# Patient Record
Sex: Male | Born: 1945
Health system: Southern US, Community
[De-identification: ages and names within clinical notes are randomized; demographics above are authoritative.]

## PROBLEM LIST (undated history)

## (undated) DIAGNOSIS — I341 Nonrheumatic mitral (valve) prolapse: Secondary | ICD-10-CM

## (undated) DIAGNOSIS — I10 Essential (primary) hypertension: Secondary | ICD-10-CM

## (undated) DIAGNOSIS — R911 Solitary pulmonary nodule: Secondary | ICD-10-CM

## (undated) DIAGNOSIS — I77810 Thoracic aortic ectasia: Secondary | ICD-10-CM

## (undated) DIAGNOSIS — R42 Dizziness and giddiness: Secondary | ICD-10-CM

## (undated) DIAGNOSIS — C801 Malignant (primary) neoplasm, unspecified: Secondary | ICD-10-CM

## (undated) DIAGNOSIS — R6889 Other general symptoms and signs: Secondary | ICD-10-CM

## (undated) DIAGNOSIS — M549 Dorsalgia, unspecified: Secondary | ICD-10-CM

## (undated) DIAGNOSIS — R002 Palpitations: Secondary | ICD-10-CM

## (undated) DIAGNOSIS — K219 Gastro-esophageal reflux disease without esophagitis: Secondary | ICD-10-CM

## (undated) DIAGNOSIS — I493 Ventricular premature depolarization: Principal | ICD-10-CM

## (undated) HISTORY — DX: Other general symptoms and signs: R68.89

## (undated) HISTORY — PX: CARDIAC CATHETERIZATION: SHX172

## (undated) HISTORY — DX: Thoracic aortic ectasia: I77.810

## (undated) HISTORY — DX: Palpitations: R00.2

## (undated) HISTORY — DX: Gastro-esophageal reflux disease without esophagitis: K21.9

## (undated) HISTORY — DX: Nonrheumatic mitral (valve) prolapse: I34.1

## (undated) HISTORY — PX: COLONOSCOPY: SHX174

## (undated) HISTORY — DX: Essential (primary) hypertension: I10

## (undated) HISTORY — DX: Ventricular premature depolarization: I49.3

## (undated) HISTORY — PX: COLONOSCOPY W/ POLYPECTOMY: SHX1380

## (undated) HISTORY — DX: Dorsalgia, unspecified: M54.9

---

## 1981-11-04 HISTORY — PX: BACK SURGERY: SHX140

## 1995-11-05 HISTORY — PX: EYE SURGERY: SHX253

## 2005-07-12 ENCOUNTER — Emergency Department (HOSPITAL_COMMUNITY): Admission: EM | Admit: 2005-07-12 | Discharge: 2005-07-13 | Payer: Self-pay | Admitting: Emergency Medicine

## 2008-07-19 ENCOUNTER — Ambulatory Visit: Payer: Self-pay | Admitting: Gastroenterology

## 2008-07-27 ENCOUNTER — Ambulatory Visit: Payer: Self-pay | Admitting: Gastroenterology

## 2008-07-27 ENCOUNTER — Encounter: Payer: Self-pay | Admitting: Gastroenterology

## 2008-07-28 ENCOUNTER — Encounter: Payer: Self-pay | Admitting: Gastroenterology

## 2009-07-03 ENCOUNTER — Encounter: Admission: RE | Admit: 2009-07-03 | Discharge: 2009-07-03 | Payer: Self-pay | Admitting: Gastroenterology

## 2010-02-24 ENCOUNTER — Inpatient Hospital Stay (HOSPITAL_COMMUNITY): Admission: EM | Admit: 2010-02-24 | Discharge: 2010-02-26 | Payer: Self-pay | Admitting: Emergency Medicine

## 2010-03-02 ENCOUNTER — Ambulatory Visit: Payer: Self-pay | Admitting: Vascular Surgery

## 2010-09-03 ENCOUNTER — Ambulatory Visit: Payer: Self-pay | Admitting: Cardiology

## 2010-09-24 ENCOUNTER — Encounter: Admission: RE | Admit: 2010-09-24 | Discharge: 2010-09-24 | Payer: Self-pay | Admitting: Cardiology

## 2011-01-22 LAB — TSH: TSH: 0.857 u[IU]/mL (ref 0.350–4.500)

## 2011-01-22 LAB — POCT I-STAT, CHEM 8
BUN: 12 mg/dL (ref 6–23)
Chloride: 102 mEq/L (ref 96–112)
Creatinine, Ser: 0.8 mg/dL (ref 0.4–1.5)
Glucose, Bld: 87 mg/dL (ref 70–99)
Potassium: 3.9 mEq/L (ref 3.5–5.1)
Sodium: 140 mEq/L (ref 135–145)

## 2011-01-22 LAB — CBC
HCT: 46.3 % (ref 39.0–52.0)
HCT: 46.7 % (ref 39.0–52.0)
Hemoglobin: 15.9 g/dL (ref 13.0–17.0)
Hemoglobin: 16.1 g/dL (ref 13.0–17.0)
MCHC: 34 g/dL (ref 30.0–36.0)
MCV: 84.7 fL (ref 78.0–100.0)
Platelets: 142 10*3/uL — ABNORMAL LOW (ref 150–400)
RBC: 5.47 MIL/uL (ref 4.22–5.81)
WBC: 6.4 10*3/uL (ref 4.0–10.5)

## 2011-01-22 LAB — BASIC METABOLIC PANEL
BUN: 13 mg/dL (ref 6–23)
Creatinine, Ser: 0.92 mg/dL (ref 0.4–1.5)
GFR calc Af Amer: >60 mL/min (ref >60)
GFR calc non Af Amer: >60 mL/min (ref >60)
Potassium: 3.3 mEq/L — ABNORMAL LOW (ref 3.5–5.1)

## 2011-01-22 LAB — LIPID PANEL
LDL Cholesterol: 88 mg/dL (ref 0–99)
Total CHOL/HDL Ratio: 2.9 RATIO
Triglycerides: 46 mg/dL (ref <150)
VLDL: 9 mg/dL (ref 0–40)

## 2011-01-22 LAB — CARDIAC PANEL(CRET KIN+CKTOT+MB+TROPI)
CK, MB: 0.4 ng/mL (ref 0.3–4.0)
Relative Index: INVALID (ref 0.0–2.5)
Total CK: 27 U/L (ref 7–232)
Total CK: 40 U/L (ref 7–232)
Troponin I: 0.01 ng/mL (ref 0.00–0.06)
Troponin I: 0.04 ng/mL (ref 0.00–0.06)

## 2011-01-22 LAB — DIFFERENTIAL
Eosinophils Absolute: 0.1 10*3/uL (ref 0.0–0.7)
Eosinophils Relative: 1 % (ref 0–5)
Lymphocytes Relative: 20 % (ref 12–46)
Lymphs Abs: 1.3 10*3/uL (ref 0.7–4.0)
Monocytes Absolute: 0.4 10*3/uL (ref 0.1–1.0)

## 2011-01-22 LAB — PROTIME-INR: Prothrombin Time: 14.1 seconds (ref 11.6–15.2)

## 2011-01-22 LAB — BRAIN NATRIURETIC PEPTIDE: Pro B Natriuretic peptide (BNP): <30 pg/mL (ref 0.0–100.0)

## 2011-01-22 LAB — POCT CARDIAC MARKERS
CKMB, poc: <1 ng/mL — ABNORMAL LOW (ref 1.0–8.0)
Myoglobin, poc: 49.7 ng/mL (ref 12–200)
Troponin i, poc: <0.05 ng/mL (ref 0.00–0.09)

## 2011-01-22 LAB — MRSA PCR SCREENING: MRSA by PCR: NEGATIVE

## 2011-01-22 LAB — HEMOGLOBIN A1C: Hgb A1c MFr Bld: 5.1 % (ref <5.7)

## 2011-03-19 NOTE — Procedures (Signed)
VASCULAR LAB EXAM   INDICATION:  Rule out pseudoaneurysm/AV fistula.   HISTORY:  Diabetes:  Cardiac:  Hypertension:   EXAM:  Right groin duplex.   IMPRESSION:  There does not appear to be any defect noted in the right  groin.  There is no evidence suggesting a pseudoaneurysm or AV fistula.   ___________________________________________  Larina Earthly, M.D.   CB/MEDQ  D:  03/02/2010  T:  03/02/2010  Job:  [id number]

## 2011-04-23 ENCOUNTER — Other Ambulatory Visit: Payer: Self-pay | Admitting: Cardiology

## 2011-04-23 MED ORDER — LISINOPRIL-HYDROCHLOROTHIAZIDE 20-25 MG PO TABS: 1.0000 | ORAL_TABLET | Freq: Every day | ORAL | Status: DC

## 2011-04-23 NOTE — Telephone Encounter (Signed)
Called needing a refill of his blood pressure medication Lysinopril and HTZ refilled at [facility] [phone]. Please call back. I have pulled the chart.

## 2011-04-23 NOTE — Telephone Encounter (Signed)
RN refilled Lisinopril HCT.  Pt notified.

## 2011-04-26 ENCOUNTER — Other Ambulatory Visit: Payer: Self-pay | Admitting: Nurse Practitioner

## 2011-04-26 DIAGNOSIS — I714 Abdominal aortic aneurysm, without rupture: Secondary | ICD-10-CM

## 2011-05-31 ENCOUNTER — Encounter: Payer: Self-pay | Admitting: Cardiovascular Disease

## 2011-05-31 ENCOUNTER — Encounter: Payer: Self-pay | Admitting: *Deleted

## 2011-06-03 ENCOUNTER — Ambulatory Visit: Payer: Self-pay | Admitting: Cardiovascular Disease

## 2011-06-11 ENCOUNTER — Encounter: Payer: Self-pay | Admitting: Cardiovascular Disease

## 2011-06-14 ENCOUNTER — Telehealth: Payer: Self-pay | Admitting: Nurse Practitioner

## 2011-06-14 ENCOUNTER — Telehealth: Payer: Self-pay | Admitting: Cardiovascular Disease

## 2011-06-14 NOTE — Telephone Encounter (Signed)
Please call pt wife back about pt's aneurysm, wife has questions and needs clarification, pt chart transferred to [facility] and pt was assigned to see Dr. Eden Emms on 07/30 however pt was a No Show for Re Establishment, former pt of Dr. Deborah Chalk and pt's wife is familiar with Norma Fredrickson

## 2011-06-14 NOTE — Telephone Encounter (Signed)
Pt's wife calling wanting to know what is an enlarged aorta and does her husband have an aneurysm--this was pt of Wayne Rodriguez and wife upset that they did not know pt had an aneurysm--wife states they had an appoint to establish with Wayne Rodriguez, but wife's sister died so they had to cancel--advised Wayne Rodriguez had a CT chest at [facility] ordered by Wayne Rodriguez on nov. 21,2011 that showed a 4.1 cm dilitation of ascending thoracic aorta, the reason we were concerned is that we like to have this rechecked each year to be sure it is not growing--wife agrees, and i reassured her it was not emergent and all we want is to have Wayne Rodriguez make and keep an appoint with Wayne nishan--pt's wife agrees and will have pt himself call as he has a varied working sched

## 2011-06-14 NOTE — Telephone Encounter (Signed)
Returning call from Wayne Rodriguez. Spoke w/ son who states his mother is at work and would not be home until 7:30. Advised if she needed to speak w/us to call back Monday. See from epic note that Ms. Seitzinger spoke w/ Nishan's office

## 2011-06-14 NOTE — Telephone Encounter (Signed)
Pt's wife called regarding a call they got in July regarding a test done at the hospital and was told it was an enlarged aorta.  Why did they receive this call?!?  Aware that Stanton Kidney is off.

## 2011-07-15 ENCOUNTER — Encounter: Payer: Self-pay | Admitting: Cardiovascular Disease

## 2011-07-15 ENCOUNTER — Ambulatory Visit (INDEPENDENT_AMBULATORY_CARE_PROVIDER_SITE_OTHER): Payer: Self-pay | Admitting: Cardiovascular Disease

## 2011-07-15 DIAGNOSIS — R079 Chest pain, unspecified: Secondary | ICD-10-CM

## 2011-07-15 DIAGNOSIS — I10 Essential (primary) hypertension: Secondary | ICD-10-CM

## 2011-07-15 DIAGNOSIS — I77819 Aortic ectasia, unspecified site: Secondary | ICD-10-CM

## 2011-07-15 DIAGNOSIS — I77810 Thoracic aortic ectasia: Secondary | ICD-10-CM | POA: Insufficient documentation

## 2011-07-15 MED ORDER — LISINOPRIL-HYDROCHLOROTHIAZIDE 20-25 MG PO TABS: 1.0000 | ORAL_TABLET | Freq: Every day | ORAL | Status: DC

## 2011-07-15 NOTE — Assessment & Plan Note (Signed)
Well controlled.  Continue current medications and low sodium Dash type diet.    

## 2011-07-15 NOTE — Assessment & Plan Note (Signed)
Labs with primary in [facility] 11/12  MRA aorta in November

## 2011-07-15 NOTE — Patient Instructions (Signed)
Your physician recommends that you schedule a follow-up appointment in: 6 MONTHS WITH DR [facility]  Your physician recommends that you continue on your current medications as directed. Please refer to the Current Medication list given to you today. Your physician recommends that you return for lab work in: [place]  BMET LIPID  DX 272.4 V58.69  MRA OF AORTA IN November 2012

## 2011-07-15 NOTE — Progress Notes (Signed)
Addended by: Scherrie Bateman E on: 07/15/2011 10:19 AM   Modules accepted: Orders

## 2011-07-15 NOTE — Progress Notes (Signed)
65 yo patient previously seen by Dr Deborah Chalk. Admitted to hospital 4/11 with SSCP.  Cath did not show any significant disease.  Has mild ascending aortic root dilatation.  CT done 11/11  1. The dilatation of the mid ascending and proximal descending portions of the thoracic aorta. Maximal diameter of the ascending thoracic aorta is 4.1 cm and maximal diameter of the descending thoracic aorta is 3.7 cm. 2. Mild emphysematous disease in the upper lobes bilaterally.  Should have F/U in a year.  Suggest MRI/MRA instead as he is relatively young.  Needs refill on BP med.  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  No SSCP, palpitations or dyspnea Compliant with meds.    ROS: Denies fever, malais, weight loss, blurry vision, decreased visual acuity, cough, sputum, SOB, hemoptysis, pleuritic pain, palpitaitons, heartburn, abdominal pain, melena, lower extremity edema, claudication, or rash.  All other systems reviewed and negative   General: Affect appropriate Healthy:  appears stated age HEENT: normal Neck supple with no adenopathy JVP normal no bruits no thyromegaly Lungs clear with no wheezing and good diaphragmatic motion Heart:  S1/S2 no murmur,rub, gallop or click PMI normal Abdomen: benighn, BS positve, no tenderness, no AAA no bruit.  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 aspirin 81 MG tablet Take 81 mg by mouth daily.        Marland Kitchen lisinopril-hydrochlorothiazide (PRINZIDE,ZESTORETIC) 20-25 MG per tablet Take 1 tablet by mouth daily.  30 tablet  3  . nitroGLYCERIN (NITROSTAT) 0.4 MG SL tablet Place 0.4 mg under the tongue every 5 (five) minutes as needed.          Allergies Codeine  Family History: No family history on file.  Social History: History   Social History  . Marital Status:  Married    Spouse Name: N/A    Number of Children: N/A  . Years of Education: N/A   Occupational History  . Not on file.   Social History Main Topics  . Smoking status: Former Games developer  . Smokeless tobacco: Not on file  . Alcohol Use: Not on file  . Drug Use: Not on file  . Sexually Active: Not on file   Other Topics Concern  . Not on file   Social History Narrative  . No narrative on file    Electrocardiogram:  NSR 57 PR 220  Otherwise normal  Assessment and Plan

## 2011-07-15 NOTE — Assessment & Plan Note (Signed)
Resolved.  Cath 4/11 with no significant disease.  Consider future cath from radial as he had a hematoma after last cath

## 2011-09-30 ENCOUNTER — Other Ambulatory Visit: Payer: Self-pay

## 2012-02-03 ENCOUNTER — Ambulatory Visit: Payer: Self-pay | Admitting: *Deleted

## 2012-02-03 DIAGNOSIS — Z0000 Encounter for general adult medical examination without abnormal findings: Secondary | ICD-10-CM

## 2012-02-17 ENCOUNTER — Encounter: Payer: Self-pay | Admitting: Nurse Practitioner

## 2012-02-17 ENCOUNTER — Telehealth: Payer: Self-pay | Admitting: Cardiovascular Disease

## 2012-02-17 ENCOUNTER — Ambulatory Visit (INDEPENDENT_AMBULATORY_CARE_PROVIDER_SITE_OTHER): Payer: Self-pay | Admitting: Nurse Practitioner

## 2012-02-17 VITALS — BP 152/70 | HR 56 | Ht 70.0 in | Wt 154.4 lb

## 2012-02-17 DIAGNOSIS — R6889 Other general symptoms and signs: Secondary | ICD-10-CM

## 2012-02-17 DIAGNOSIS — I77819 Aortic ectasia, unspecified site: Secondary | ICD-10-CM

## 2012-02-17 DIAGNOSIS — R002 Palpitations: Secondary | ICD-10-CM | POA: Insufficient documentation

## 2012-02-17 LAB — CBC WITH DIFFERENTIAL/PLATELET
Basophils Absolute: 0 10*3/uL (ref 0.0–0.1)
Basophils Relative: 0.5 % (ref 0.0–3.0)
Eosinophils Absolute: 0.1 10*3/uL (ref 0.0–0.7)
HCT: 40.3 % (ref 39.0–52.0)
Hemoglobin: 13.5 g/dL (ref 13.0–17.0)
Lymphocytes Relative: 16.9 % (ref 12.0–46.0)
Lymphs Abs: 1.1 10*3/uL (ref 0.7–4.0)
MCHC: 33.5 g/dL (ref 30.0–36.0)
MCV: 85.8 fl (ref 78.0–100.0)
Monocytes Absolute: 0.6 10*3/uL (ref 0.1–1.0)
Neutro Abs: 4.6 10*3/uL (ref 1.4–7.7)
RBC: 4.7 Mil/uL (ref 4.22–5.81)
RDW: 13.4 % (ref 11.5–14.6)

## 2012-02-17 LAB — BASIC METABOLIC PANEL
CO2: 30 mEq/L (ref 19–32)
Calcium: 8.9 mg/dL (ref 8.4–10.5)
Chloride: 101 mEq/L (ref 96–112)
Glucose, Bld: 93 mg/dL (ref 70–99)
Sodium: 139 mEq/L (ref 135–145)

## 2012-02-17 MED ORDER — LISINOPRIL-HYDROCHLOROTHIAZIDE 20-12.5 MG PO TABS: 2.0000 | ORAL_TABLET | Freq: Every day | ORAL | Status: DC

## 2012-02-17 MED ORDER — NITROGLYCERIN 0.4 MG SL SUBL: 0.4000 mg | SUBLINGUAL_TABLET | SUBLINGUAL | Status: DC | PRN

## 2012-02-17 NOTE — Telephone Encounter (Signed)
SPOKE WITH PT.PT COMPLAINING  OF HEART BEATING HARD AND IRREG ,WEAK ,AND NO ENERGY   NOTICED Saturday  APPT MADE WITH Wayne BERGE  NP   FOR TODAY AT 11:45 AM .Wayne Rodriguez

## 2012-02-17 NOTE — Progress Notes (Signed)
Patient Name: Wayne Rodriguez Date of Encounter: 02/17/2012  Primary Care Provider:  Dr. Guadelupe Sabin Primary Cardiologist:  P. Eden Emms, MD  Patient Profile  66 y/o male w/ h/o chest pain and Ao Root Dilatation who presets with a 1 wk h/o wkns and palpitations.  Problem List   Past Medical History  Diagnosis Date  . Chest pain     a. 02/2010 Cath: normal  . HTN (hypertension)   . GERD (gastroesophageal reflux disease)   . Back pain   . Aortic root dilatation     a. 09/2010 CT: 4.1cm Max Asc Ao diam, 3.7cm Max Desc Ao diam;  . Decreased exercise tolerance     a. 02/2012  . Palpitations     a. h/o palps r/t caffeine - has occurred again 02/2012   Past Surgical History  Procedure Date  . Back surgery     Allergies  Allergies  Allergen Reactions  . Beta Adrenergic Blockers     Bradycardia   . Codeine     REACTION: nausea/vomiting    HPI  66 yr old male with the above problem list.  He was in his usoh until about 1 wk ago when he began to experience generalized wkns and reduced exercise tolerance.  He normally walks about a mile/day but since last week, notes that he feels tired (without chest pain or dyspnea) prior to completing his walk.  As a result, he stopped walking.  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  This would last 4-5 mins and resolve spontaneously.  He denies tachypalps.  He went to a local fire dept to have his BP checked in the midst of these episodes and it was elevated @ 169/80.  He has had no palpitations since Saturday but continues to feel fatigued.  Home Medications  Prior to Admission medications   Medication Sig Start Date End Date Taking? Authorizing Provider  aspirin 81 MG tablet Take 81 mg by mouth daily.     Yes Historical Provider, MD  Cyanocobalamin (VITAMIN B-12 PO) Take by  mouth.   Yes Historical Provider, MD  fish oil-omega-3 fatty acids 1000 MG capsule Take 2 g by mouth daily.   Yes Historical Provider, MD  nitroGLYCERIN (NITROSTAT) 0.4 MG SL tablet Place 1 tablet (0.4 mg total) under the tongue every 5 (five) minutes as needed. 02/17/12  Yes Ok Anis, NP  lisinopril-hydrochlorothiazide (PRINZIDE,ZESTORETIC) 20-12.5 MG per tablet Take 2 tablets by mouth daily. 02/17/12 02/16/13  Ok Anis, NP   Review of Systems Fatigue and palpitations as outlined in the HPI.  He's noted a small growth on his left ear pinna which he plans to see dermatology for.  No chest pain, sob, n, v, dizziness, syncope, edema, early satiety, dysuria, dark stools, blood in stools, diarrhea, rash/skin changes, fevers, chills, wt loss/gain.  Otherwise all systems reviewed and negative.  Physical Exam  Blood pressure 152/70, pulse 56, height 5\' 10"  (1.778 m), weight 154 lb 6.4 oz (70.035 kg).  General: Pleasant, NAD Psych: Normal affect. Neuro: Alert and oriented X 3. Moves all extremities spontaneously. HEENT: Normal.  Neck: Supple without bruits or JVD. Lungs:  Resp regular and unlabored, CTA. Heart: RRR no s3, s4, or murmurs. Abdomen: Soft, non-tender, non-distended, BS + x 4.  Extremities: No clubbing, cyanosis or edema.  DP/PT/Radials 2+ and equal bilaterally.  Accessory Clinical Findings  ECG - sinus brady, 1st deg AVB, 56.  No acute st/t changes.  Assessment & Plan  1.  Palpitations:  Pt has prior h/o palpitations prev followed by Dr. Reyes Ivan -> Dr. Deborah Chalk.  Pt says that they were able to isolate caffeine as a causative agent @ that time and he has since avoided.  He's had return of irregular, not necessarily tachy, palpitations.  We'll place a 21 day event monitor to eval.  Check TSH.  2.  Fatigue/Decreased exercise tolerance:  1 wk h/o of wkns/fatigue.  No chest pain or sob.  Will check echo (no evidence of heart failure on exam) and cbc, bmet, tsh.  Will have  him f/u with Dr. Eden Emms after completion of testing.  3.  HTN:  BP has been trending high @ home (140's per pt records) and is high today.  Will increase lisinopril-hctz to 40-25.  4.  Ao Root Dil:  Pt was supposed to have MRA in November but did not 2/2 lack of insurance.  He is now willing to proceed.  Will schedule.   Nicolasa Ducking, NP 02/17/2012, 1:08 PM

## 2012-02-17 NOTE — Patient Instructions (Addendum)
Your physician recommends that you return for lab work in: today (cbc, bmet, tsh)   Your physician recommends that you schedule a follow-up appointment in: 2 weeks with Dr Eden Emms  Your physician has requested that you have an echocardiogram. Echocardiography is a painless test that uses sound waves to create images of your heart. It provides your doctor with information about the size and shape of your heart and how well your heart's chambers and valves are working. This procedure takes approximately one hour. There are no restrictions for this procedure.  Your physician has recommended that you wear an event monitor. Event monitors are medical devices that record the heart's electrical activity. Doctors most often [place] these monitors to diagnose arrhythmias. Arrhythmias are problems with the speed or rhythm of the heartbeat. The monitor is a small, portable device. You can wear one while you do your normal daily activities. This is usually used to diagnose what is causing palpitations/syncope (passing out).  Your physician has requested that you have an MRA Angio of the chest and abdomen for aortic root dilitation.  Your physician has recommended you make the following change in your medication: Start taking Lisinopril/HCTZ 20/12.5mg  two tabs once daily

## 2012-02-17 NOTE — Telephone Encounter (Signed)
New problem:  Patient calling would like to be seen today. C/O heart out of rhythm, weak. Pt could not afford the ct scan.

## 2012-02-18 ENCOUNTER — Encounter (INDEPENDENT_AMBULATORY_CARE_PROVIDER_SITE_OTHER): Payer: Medicare HMO

## 2012-02-18 DIAGNOSIS — R002 Palpitations: Secondary | ICD-10-CM

## 2012-02-19 ENCOUNTER — Other Ambulatory Visit (HOSPITAL_COMMUNITY): Payer: Medicare HMO

## 2012-02-24 ENCOUNTER — Other Ambulatory Visit: Payer: Self-pay | Admitting: *Deleted

## 2012-02-27 ENCOUNTER — Ambulatory Visit (HOSPITAL_COMMUNITY)
Admission: RE | Admit: 2012-02-27 | Discharge: 2012-02-27 | Disposition: A | Discharge status: Home / Self Care | Payer: Medicare HMO | Source: Ambulatory Visit | Attending: Nurse Practitioner | Admitting: Nurse Practitioner

## 2012-02-27 ENCOUNTER — Other Ambulatory Visit (HOSPITAL_COMMUNITY): Payer: Medicare HMO

## 2012-02-27 ENCOUNTER — Inpatient Hospital Stay (HOSPITAL_COMMUNITY): Admission: RE | Admit: 2012-02-27 | Payer: Medicare HMO | Source: Ambulatory Visit

## 2012-02-27 ENCOUNTER — Other Ambulatory Visit: Payer: Self-pay | Admitting: *Deleted

## 2012-02-27 DIAGNOSIS — I712 Thoracic aortic aneurysm, without rupture, unspecified: Secondary | ICD-10-CM | POA: Insufficient documentation

## 2012-02-27 DIAGNOSIS — R6889 Other general symptoms and signs: Secondary | ICD-10-CM

## 2012-02-27 MED ORDER — GADOBENATE DIMEGLUMINE 529 MG/ML IV SOLN
15.0000 mL | Freq: Once | INTRAVENOUS | Status: AC
  Administered 2012-02-27: 15 mL via INTRAVENOUS

## 2012-03-04 ENCOUNTER — Other Ambulatory Visit: Payer: Self-pay

## 2012-03-04 ENCOUNTER — Ambulatory Visit (HOSPITAL_COMMUNITY): Payer: Medicare HMO | Attending: Nurse Practitioner

## 2012-03-04 DIAGNOSIS — I77819 Aortic ectasia, unspecified site: Secondary | ICD-10-CM | POA: Insufficient documentation

## 2012-03-04 DIAGNOSIS — I359 Nonrheumatic aortic valve disorder, unspecified: Secondary | ICD-10-CM

## 2012-03-04 DIAGNOSIS — R002 Palpitations: Secondary | ICD-10-CM | POA: Insufficient documentation

## 2012-03-04 DIAGNOSIS — R079 Chest pain, unspecified: Secondary | ICD-10-CM | POA: Insufficient documentation

## 2012-03-04 DIAGNOSIS — I059 Rheumatic mitral valve disease, unspecified: Secondary | ICD-10-CM | POA: Insufficient documentation

## 2012-03-04 DIAGNOSIS — R6889 Other general symptoms and signs: Secondary | ICD-10-CM

## 2012-03-09 ENCOUNTER — Ambulatory Visit (INDEPENDENT_AMBULATORY_CARE_PROVIDER_SITE_OTHER): Payer: Medicare HMO | Admitting: Cardiovascular Disease

## 2012-03-09 ENCOUNTER — Encounter: Payer: Self-pay | Admitting: *Deleted

## 2012-03-09 ENCOUNTER — Encounter: Payer: Self-pay | Admitting: Cardiovascular Disease

## 2012-03-09 VITALS — BP 130/74 | HR 56 | Ht 69.0 in | Wt 153.0 lb

## 2012-03-09 DIAGNOSIS — R002 Palpitations: Secondary | ICD-10-CM

## 2012-03-09 DIAGNOSIS — R079 Chest pain, unspecified: Secondary | ICD-10-CM

## 2012-03-09 DIAGNOSIS — R0989 Other specified symptoms and signs involving the circulatory and respiratory systems: Secondary | ICD-10-CM

## 2012-03-09 NOTE — Assessment & Plan Note (Signed)
Atypical  Normal cath 2011 Observe.  Pain ppt by food.  Encouraged to F/U with primary and consider proton pump inhibitor

## 2012-03-09 NOTE — Assessment & Plan Note (Signed)
Stable by MRA.  BP under good control.  He has mild tenderness and palpable abdominal aorta and will need a US duplex to R/O AAA

## 2012-03-09 NOTE — Progress Notes (Signed)
Patient ID: Wayne Rodriguez, male   DOB: Nov 27, 1945, 66 y.o.   MRN: [id number] 66 yo patient previously seen by Dr Deborah Chalk. Admitted to hospital 4/11 with SSCP. Cath did not show any significant disease. Has mild ascending aortic root dilatation. CT done 11/11  1. The dilatation of the mid ascending and proximal descending portions of the thoracic aorta. Maximal diameter of the ascending thoracic aorta is 4.1 cm and maximal diameter of the descending thoracic aorta is 3.7 cm. 2. Mild emphysematous disease in the upper lobes bilaterally.  F/U MRA read by GY showed  IMPRESSION:  Stable mild aneurysmal dilatation primarily involving the ascending  thoracic aorta, measuring 4.1 - 4.2 cm in greatest diameter. Mild  dilatation also is again noted involving the aortic arch.  Saw primary 4/15 complained of weakness and palpitations.    Event monitor 4/16-5/6  NSR occ PVC;s no signficiant arrhythmia.  ROS: Denies fever, malais, weight loss, blurry vision, decreased visual acuity, cough, sputum, SOB, hemoptysis, pleuritic pain, palpitaitons, heartburn, abdominal pain, melena, lower extremity edema, claudication, or rash.  All other systems reviewed and negative  General: Affect appropriate Healthy:  appears stated age HEENT: normal Neck supple with no adenopathy JVP normal no bruits no thyromegaly Lungs clear with no wheezing and good diaphragmatic motion Heart:  S1/S2 no murmur, no rub, gallop or click PMI normal Abdomen: benighn, BS positve, no tenderness, no AAA no bruit.  No HSM or HJR Distal pulses intact with no bruits No edema Neuro non-focal Skin warm and dry No muscular weakness   Current Outpatient Prescriptions  Medication Sig Dispense Refill  . aspirin 81 MG tablet Take 81 mg by mouth daily.        . Cyanocobalamin (VITAMIN B-12 PO) Take 1 tablet by mouth daily.       . fish oil-omega-3 fatty acids 1000 MG capsule Take 2 g by mouth daily.      Marland Kitchen  lisinopril-hydrochlorothiazide (PRINZIDE,ZESTORETIC) 20-12.5 MG per tablet Take 2 tablets by mouth daily.  60 tablet  6  . nitroGLYCERIN (NITROSTAT) 0.4 MG SL tablet Place 1 tablet (0.4 mg total) under the tongue every 5 (five) minutes as needed.  25 tablet  11    Allergies  Beta adrenergic blockers and Codeine  Electrocardiogram:  Assessment and Plan

## 2012-03-09 NOTE — Patient Instructions (Signed)
Your physician wants you to follow-up in: YEAR WITH DR Haywood Filler will receive a reminder letter in the mail two months in advance. If you don't receive a letter, please call our office to schedule the follow-up appointment. Your physician recommends that you continue on your current medications as directed. Please refer to the Current Medication list given to you today. Your physician has requested that you have an abdominal aorta duplex. During this test, an ultrasound is used to evaluate the aorta. Allow 30 minutes for this exam. Do not eat after midnight the day before and avoid carbonated beverages DX PALPABLE AORTA

## 2012-03-09 NOTE — Assessment & Plan Note (Signed)
PPt by epigastric pain with some food.  Event monitor reviewed and no significant arrhythnmia.

## 2012-04-01 ENCOUNTER — Telehealth: Payer: Self-pay | Admitting: Cardiovascular Disease

## 2012-04-01 NOTE — Telephone Encounter (Signed)
Says his blood pressure was up and he felt bad. He thinks he forgot his meds. Not sure what has happened with Dr. Eden Emms. We had a bad phone reception. He has now taken his medicine and is already feeling better. He will recheck it when he gets home and take an extra pill if still elevated. He is going to call me back tomorrow.

## 2012-04-01 NOTE — Telephone Encounter (Signed)
Pt wife is very concerned about her husband having a stroke and he doesn't want to see Dr. Eden Emms anymore but she really would like to talk to you I have scheduled him with Dr. Swaziland on Monday.

## 2012-04-03 NOTE — Telephone Encounter (Signed)
Patient was called, he stated he had to cancel appointment with Dr.Jordan for 04/06/12 he could not get off work.States he feels better since he is taking his B/P meds.Appointment scheduled with Norma Fredrickson NP 04/20/12.

## 2012-04-06 ENCOUNTER — Ambulatory Visit: Payer: Medicare HMO | Admitting: Cardiology

## 2012-04-20 ENCOUNTER — Ambulatory Visit (INDEPENDENT_AMBULATORY_CARE_PROVIDER_SITE_OTHER): Payer: Medicare HMO | Admitting: Nurse Practitioner

## 2012-04-20 ENCOUNTER — Encounter: Payer: Self-pay | Admitting: Nurse Practitioner

## 2012-04-20 VITALS — BP 136/66 | HR 56 | Ht 69.0 in | Wt 155.0 lb

## 2012-04-20 DIAGNOSIS — E785 Hyperlipidemia, unspecified: Secondary | ICD-10-CM

## 2012-04-20 DIAGNOSIS — I10 Essential (primary) hypertension: Secondary | ICD-10-CM

## 2012-04-20 DIAGNOSIS — I77810 Thoracic aortic ectasia: Secondary | ICD-10-CM

## 2012-04-20 LAB — LIPID PANEL
Cholesterol: 132 mg/dL (ref 0–200)
HDL: 52 mg/dL (ref >39.00)
LDL Cholesterol: 70 mg/dL (ref 0–99)
Total CHOL/HDL Ratio: 3
Triglycerides: 48 mg/dL (ref 0.0–149.0)
VLDL: 9.6 mg/dL (ref 0.0–40.0)

## 2012-04-20 LAB — HEPATIC FUNCTION PANEL
ALT: 21 U/L (ref 0–53)
AST: 20 U/L (ref 0–37)
Albumin: 3.6 g/dL (ref 3.5–5.2)
Alkaline Phosphatase: 46 U/L (ref 39–117)
Bilirubin, Direct: 0 mg/dL (ref 0.0–0.3)
Total Bilirubin: 0.5 mg/dL (ref 0.3–1.2)
Total Protein: 6.8 g/dL (ref 6.0–8.3)

## 2012-04-20 NOTE — Assessment & Plan Note (Signed)
Has had prior MRA back in April.

## 2012-04-20 NOTE — Assessment & Plan Note (Signed)
Blood pressure is doing well. He will continue to monitor at home. I will have Dr. Swaziland see him in 4 months and I will be available to him as well. We will check fasting labs. He is going to have an abdominal ultrasound in July and I encouraged him to follow thru on that order. Patient is agreeable to this plan and will call if any problems develop in the interim.

## 2012-04-20 NOTE — Progress Notes (Signed)
[name] Date of Birth: 09-16-1946 Medical Record #[id number]  History of Present Illness: Wayne Rodriguez is seen back today for a work in visit. He is seen for Dr. Swaziland. He has no known CAD per remote cath. Does have an enlarged aortic root and has had recent MRA back in April of this year. He had called me last month saying he had a day where he did not feel good. He wasn't sure if he had taken his blood pressure pills and so I told him to just take it. He has done fine since and has had no issues. He has some chronic stress at work with the [employer]. No chest pain. Some palpitations but a negative event monitor back in April. He does want to switch to Dr. Swaziland. He says he and Dr. Eden Emms "don't click".   Current Outpatient Prescriptions on File Prior to Visit  Medication Sig Dispense Refill  . Ascorbic Acid (VITAMIN C) 1000 MG tablet Take 1,000 mg by mouth daily.      Marland Kitchen aspirin 81 MG tablet Take 81 mg by mouth daily.        . Cyanocobalamin (VITAMIN B-12 PO) Take 1 tablet by mouth daily.       . fish oil-omega-3 fatty acids 1000 MG capsule Take 2 g by mouth daily.      Marland Kitchen lisinopril-hydrochlorothiazide (PRINZIDE,ZESTORETIC) 20-12.5 MG per tablet Take 2 tablets by mouth daily.  60 tablet  6  . nitroGLYCERIN (NITROSTAT) 0.4 MG SL tablet Place 1 tablet (0.4 mg total) under the tongue every 5 (five) minutes as needed.  25 tablet  11    Allergies  Allergen Reactions  . Beta Adrenergic Blockers     Bradycardia   . Codeine     REACTION: nausea/vomiting    Past Medical History  Diagnosis Date  . Chest pain     a. 02/2010 Cath: normal  . HTN (hypertension)   . GERD (gastroesophageal reflux disease)   . Back pain   . Aortic root dilatation     a. 09/2010 CT: 4.1cm Max Asc Ao diam, 3.7cm Max Desc Ao diam;  . Decreased exercise tolerance     a. 02/2012  . Palpitations     a. h/o palps r/t caffeine - has occurred again 02/2012    Past Surgical History  Procedure Date  . Back  surgery     History  Smoking status  . Former Smoker  Smokeless tobacco  . Not on file    History  Alcohol Use No    Family History  Problem Relation Age of Onset  . Lung cancer Mother     Review of Systems: The review of systems is positive for occasional palpitations, mostly related to his work. Not dizzy or lightheaded. No chest pain. Blood pressure now averaging in the 120's at home and doing well. Feels good on his current regimen.  All other systems were reviewed and are negative.  Physical Exam: BP 136/66  Pulse 56  Ht 5\' 9"  (1.753 m)  Wt 155 lb (70.308 kg)  BMI 22.89 kg/m2 Patient is very pleasant and in no acute distress. Skin is warm and dry. Color is normal.  HEENT is unremarkable. Normocephalic/atraumatic. PERRL. Sclera are nonicteric. Neck is supple. No masses. No JVD. Lungs are clear. Cardiac exam shows a regular rate and rhythm. Abdomen is soft. Extremities are without edema. Gait and ROM are intact. No gross neurologic deficits noted.   LABORATORY DATA: Lipids are pending for  today.    Assessment / Plan:

## 2012-04-20 NOTE — Patient Instructions (Signed)
Let's check your lipids today  I want you to stay on your current medicines  I would have you do the ultrasound of your abdomen next month to look at the bottom of your aorta and make sure its not enlarged.  I will have you see Dr. Swaziland in 4 months.  Call the [facility] office at [phone] if you have any questions, problems or concerns.

## 2012-05-11 ENCOUNTER — Encounter (INDEPENDENT_AMBULATORY_CARE_PROVIDER_SITE_OTHER): Payer: Medicare HMO

## 2012-05-11 DIAGNOSIS — I716 Thoracoabdominal aortic aneurysm, without rupture: Secondary | ICD-10-CM

## 2012-05-11 DIAGNOSIS — R0989 Other specified symptoms and signs involving the circulatory and respiratory systems: Secondary | ICD-10-CM

## 2012-05-11 DIAGNOSIS — R109 Unspecified abdominal pain: Secondary | ICD-10-CM

## 2012-07-24 ENCOUNTER — Encounter: Payer: Self-pay | Admitting: Gastroenterology

## 2012-10-09 ENCOUNTER — Telehealth: Payer: Self-pay | Admitting: Nurse Practitioner

## 2012-10-09 NOTE — Telephone Encounter (Signed)
plz return call to pt [phone] or [phone] regarding new cardio doc recommendation.

## 2012-10-09 NOTE — Telephone Encounter (Signed)
He has made an appointment with me for Monday regarding his rhythm.

## 2012-10-12 ENCOUNTER — Ambulatory Visit (INDEPENDENT_AMBULATORY_CARE_PROVIDER_SITE_OTHER): Payer: Medicare HMO | Admitting: Nurse Practitioner

## 2012-10-12 ENCOUNTER — Encounter: Payer: Self-pay | Admitting: Nurse Practitioner

## 2012-10-12 ENCOUNTER — Encounter: Payer: Self-pay | Admitting: Gastroenterology

## 2012-10-12 VITALS — BP 132/70 | HR 50 | Ht 69.0 in | Wt 158.1 lb

## 2012-10-12 DIAGNOSIS — E785 Hyperlipidemia, unspecified: Secondary | ICD-10-CM

## 2012-10-12 DIAGNOSIS — R002 Palpitations: Secondary | ICD-10-CM

## 2012-10-12 MED ORDER — LISINOPRIL-HYDROCHLOROTHIAZIDE 20-12.5 MG PO TABS: 2.0000 | ORAL_TABLET | Freq: Every day | ORAL | Status: DC

## 2012-10-12 NOTE — Patient Instructions (Addendum)
We will check fasting labs in one week  Stop the supplement you are taking that has the licorice extract  If your palpitations continue over the next week, call me and we will put a heart monitor back on  See Dr. Swaziland in 4 months. We will plan on arranging follow up of your aneurysm at that time  We will get you a visit with [facility] to get your colonoscopy rescheduled.   Call the [facility] office at [phone] if you have any questions, problems or concerns.

## 2012-10-12 NOTE — Progress Notes (Signed)
Wayne Rodriguez Date of Birth: 1946-09-30 Medical Record #[id number]  History of Present Illness: Wayne Rodriguez is seen back today for a 6 month check. He is seen for Dr. Swaziland. He has no known CAD per remote cath. Does have an enlarged aortic root and had MRA in April of this year. Has had palpitations and a negative event monitor back in April. Other issues include HTN.  He comes in today. He is here alone. He is doing ok. No chest pain. Continues to work at the [employer]. Has a lot of stress with his job. Not fasting today. Needs repeat labs. Notes that over the past 3 weeks has had more in the way of palpitations. Has always had some degree of palpitations, but now is worse. Feels like he is "going out of rhythm". This makes him feel weak. It happens several times a week and will last for just a few minutes. Not lightheaded or dizzy. No syncope. Has stopped his decaffeinated tea. He is taking some supplement that has licorice extract. Also notes that he is overdue on repeat colonoscopy and requests referral.   Current Outpatient Prescriptions on File Prior to Visit  Medication Sig Dispense Refill  . Ascorbic Acid (VITAMIN C) 1000 MG tablet Take 1,000 mg by mouth daily.      Marland Kitchen aspirin 81 MG tablet Take 81 mg by mouth daily.        . Cyanocobalamin (VITAMIN B-12 PO) Take 1 tablet by mouth daily.       . nitroGLYCERIN (NITROSTAT) 0.4 MG SL tablet Place 1 tablet (0.4 mg total) under the tongue every 5 (five) minutes as needed.  25 tablet  11  . [DISCONTINUED] lisinopril-hydrochlorothiazide (PRINZIDE,ZESTORETIC) 20-12.5 MG per tablet Take 2 tablets by mouth daily.  60 tablet  6    Allergies  Allergen Reactions  . Beta Adrenergic Blockers     Bradycardia   . Codeine     REACTION: nausea/vomiting    Past Medical History  Diagnosis Date  . Chest pain     a. 02/2010 Cath: normal  . HTN (hypertension)   . GERD (gastroesophageal reflux disease)   . Back pain   . Aortic root dilatation       a. 09/2010 CT: 4.1cm Max Asc Ao diam, 3.7cm Max Desc Ao diam;; s/p MRI/MRA in April 2013  . Decreased exercise tolerance     a. 02/2012  . Palpitations     a. h/o palps r/t caffeine - has occurred again 02/2012    Past Surgical History  Procedure Date  . Back surgery     History  Smoking status  . Former Smoker  Smokeless tobacco  . Not on file    History  Alcohol Use No    Family History  Problem Relation Age of Onset  . Lung cancer Mother     Review of Systems: The review of systems is per the HPI.  All other systems were reviewed and are negative.  Physical Exam: BP 132/70  Pulse 50  Ht 5\' 9"  (1.753 m)  Wt 158 lb 1.9 oz (71.723 kg)  BMI 23.35 kg/m2 Patient is very pleasant and in no acute distress. Skin is warm and dry. Color is normal.  HEENT is unremarkable. Normocephalic/atraumatic. PERRL. Sclera are nonicteric. Neck is supple. No masses. No JVD. Lungs are clear. Cardiac exam shows a regular rate and rhythm. Abdomen is soft. Extremities are without edema. Gait and ROM are intact. No gross neurologic deficits noted.  LABORATORY DATA:   Lab Results  Component Value Date   WBC 6.5 02/17/2012   HGB 13.5 02/17/2012   HCT 40.3 02/17/2012   PLT 153.0 02/17/2012   GLUCOSE 93 02/17/2012   CHOL 132 04/20/2012   TRIG 48.0 04/20/2012   HDL 52.00 04/20/2012   LDLCALC 70 04/20/2012   ALT 21 04/20/2012   AST 20 04/20/2012   NA 139 02/17/2012   K 3.9 02/17/2012   CL 101 02/17/2012   CREATININE 0.8 02/17/2012   BUN 16 02/17/2012   CO2 30 02/17/2012   TSH 1.50 02/17/2012   INR 1.10 02/24/2010   HGBA1C  Value: 5.1 (NOTE)                                                                       According to the ADA Clinical Practice Recommendations for 2011, when HbA1c is used as a screening test:   >=6.5%   Diagnostic of Diabetes Mellitus           (if abnormal result  is confirmed)  5.7-6.4%   Increased risk of developing Diabetes Mellitus  References:Diagnosis and Classification of  Diabetes Mellitus,Diabetes Care,2011,34(Suppl 1):S62-S69 and Standards of Medical Care in         Diabetes - 2011,Diabetes Care,2011,34  (Suppl 1):S11-S61. 02/24/2010    Assessment / Plan: 1. HTN - he has good BP control. His medicine is refilled for his mail order today.   2. Palpitations - has been a chronic issue. I have asked him to stop this supplement and see if he improves. If not, will need to repeat his event monitor. No change in his medicines for now.   3. Enlarged aortic root - needs follow up in April.  We will check fasting labs next week. No change in his medicines for now. See Dr. Swaziland back in about 4 months.   Patient is agreeable to this plan and will call if any problems develop in the interim.

## 2012-10-19 ENCOUNTER — Other Ambulatory Visit: Payer: Medicare HMO

## 2012-10-19 ENCOUNTER — Other Ambulatory Visit (INDEPENDENT_AMBULATORY_CARE_PROVIDER_SITE_OTHER): Payer: Medicare HMO

## 2012-10-19 DIAGNOSIS — E785 Hyperlipidemia, unspecified: Secondary | ICD-10-CM

## 2012-10-19 LAB — LIPID PANEL
Cholesterol: 151 mg/dL (ref 0–200)
HDL: 43.6 mg/dL (ref >39.00)
LDL Cholesterol: 85 mg/dL (ref 0–99)
Total CHOL/HDL Ratio: 3
Triglycerides: 114 mg/dL (ref 0.0–149.0)
VLDL: 22.8 mg/dL (ref 0.0–40.0)

## 2012-10-19 LAB — BASIC METABOLIC PANEL
BUN: 17 mg/dL (ref 6–23)
CO2: 32 mEq/L (ref 19–32)
Calcium: 9.1 mg/dL (ref 8.4–10.5)
Chloride: 98 mEq/L (ref 96–112)
Creatinine, Ser: 0.9 mg/dL (ref 0.4–1.5)
GFR: 87.58 mL/min (ref >60.00)
Glucose, Bld: 103 mg/dL — ABNORMAL HIGH (ref 70–99)
Potassium: 4 mEq/L (ref 3.5–5.1)
Sodium: 137 mEq/L (ref 135–145)

## 2012-10-19 LAB — HEPATIC FUNCTION PANEL
ALT: 15 U/L (ref 0–53)
AST: 19 U/L (ref 0–37)
Albumin: 4 g/dL (ref 3.5–5.2)
Alkaline Phosphatase: 45 U/L (ref 39–117)
Bilirubin, Direct: 0 mg/dL (ref 0.0–0.3)
Total Bilirubin: 0.9 mg/dL (ref 0.3–1.2)
Total Protein: 6.9 g/dL (ref 6.0–8.3)

## 2012-10-26 ENCOUNTER — Other Ambulatory Visit: Payer: Medicare HMO | Admitting: Gastroenterology

## 2012-12-07 ENCOUNTER — Encounter: Payer: Self-pay | Admitting: Nurse Practitioner

## 2012-12-07 ENCOUNTER — Ambulatory Visit (INDEPENDENT_AMBULATORY_CARE_PROVIDER_SITE_OTHER): Payer: Medicare HMO | Admitting: Nurse Practitioner

## 2012-12-07 VITALS — BP 118/62 | HR 54 | Ht 69.0 in | Wt 159.0 lb

## 2012-12-07 DIAGNOSIS — R002 Palpitations: Secondary | ICD-10-CM

## 2012-12-07 DIAGNOSIS — I10 Essential (primary) hypertension: Secondary | ICD-10-CM

## 2012-12-07 DIAGNOSIS — I7789 Other specified disorders of arteries and arterioles: Secondary | ICD-10-CM

## 2012-12-07 MED ORDER — NITROGLYCERIN 0.4 MG SL SUBL: 0.4000 mg | SUBLINGUAL_TABLET | SUBLINGUAL | Status: DC | PRN

## 2012-12-07 NOTE — Addendum Note (Signed)
Addended by: Rosalio Macadamia on: 12/07/2012 09:03 AM   Modules accepted: Orders

## 2012-12-07 NOTE — Progress Notes (Signed)
Wayne Rodriguez Date of Birth: 02-16-1946 Medical Record #[id number]  History of Present Illness: [name] is seen back today for a work in visit. He is seen for Dr. Swaziland. He has no known CAD per remote cath. Does have an enlarged aortic root with MRA last in April of 2013. Has had palpitations with a negative event monitor back last April. Other issues include HTN.   I saw him in December. He had had some palpitations and was symptomatic. He was using caffeine and licorice extract.   He comes in today. He is here alone. He has no complaints. Feels good. No palpitations since stopping his coffee at [facility]. Not lightheaded or dizzy. No chest pain.   Current Outpatient Prescriptions on File Prior to Visit  Medication Sig Dispense Refill  . Ascorbic Acid (VITAMIN C) 1000 MG tablet Take 1,000 mg by mouth daily.      Marland Kitchen aspirin 81 MG tablet Take 81 mg by mouth daily.        . Cholecalciferol (VITAMIN D-3 PO) Take by mouth daily.      Marland Kitchen Cod Liver Oil 1000 MG CAPS Take by mouth daily.      . Cyanocobalamin (VITAMIN B-12 PO) Take 1 tablet by mouth daily.       . Flaxseed, Linseed, (FLAX SEED OIL PO) Take by mouth daily.      Marland Kitchen lisinopril-hydrochlorothiazide (PRINZIDE,ZESTORETIC) 20-12.5 MG per tablet Take 2 tablets by mouth daily.  180 tablet  3  . nitroGLYCERIN (NITROSTAT) 0.4 MG SL tablet Place 1 tablet (0.4 mg total) under the tongue every 5 (five) minutes as needed.  25 tablet  11  . Probiotic Product (PROBIOTIC DAILY PO) Take by mouth daily.        Allergies  Allergen Reactions  . Beta Adrenergic Blockers     Bradycardia   . Codeine     REACTION: nausea/vomiting    Past Medical History  Diagnosis Date  . Chest pain     a. 02/2010 Cath: normal  . HTN (hypertension)   . GERD (gastroesophageal reflux disease)   . Back pain   . Aortic root dilatation     a. 09/2010 CT: 4.1cm Max Asc Ao diam, 3.7cm Max Desc Ao diam;; s/p MRI/MRA in April 2013  . Decreased exercise tolerance      a. 02/2012  . Palpitations     a. h/o palps r/t caffeine - has occurred again 02/2012    Past Surgical History  Procedure Date  . Back surgery     History  Smoking status  . Former Smoker  Smokeless tobacco  . Not on file    History  Alcohol Use No    Family History  Problem Relation Age of Onset  . Lung cancer Mother     Review of Systems: The review of systems is per the HPI.  All other systems were reviewed and are negative.  Physical Exam: BP 118/62  Pulse 54  Ht 5\' 9"  (1.753 m)  Wt 159 lb (72.122 kg)  BMI 23.48 kg/m2 Patient is very pleasant and in no acute distress. Skin is warm and dry. Color is normal.  HEENT is unremarkable. Normocephalic/atraumatic. PERRL. Sclera are nonicteric. Neck is supple. No masses. No JVD. Lungs are clear. Cardiac exam shows a regular rate and rhythm. Abdomen is soft. Extremities are without edema. Gait and ROM are intact. No gross neurologic deficits noted.  LABORATORY DATA:  Lab Results  Component Value Date   WBC 6.5  02/17/2012   HGB 13.5 02/17/2012   HCT 40.3 02/17/2012   PLT 153.0 02/17/2012   GLUCOSE 103* 10/19/2012   CHOL 151 10/19/2012   TRIG 114.0 10/19/2012   HDL 43.60 10/19/2012   LDLCALC 85 10/19/2012   ALT 15 10/19/2012   AST 19 10/19/2012   NA 137 10/19/2012   K 4.0 10/19/2012   CL 98 10/19/2012   CREATININE 0.9 10/19/2012   BUN 17 10/19/2012   CO2 32 10/19/2012   TSH 1.50 02/17/2012   INR 1.10 02/24/2010   HGBA1C  Value: 5.1 (NOTE)                                                                       According to the ADA Clinical Practice Recommendations for 2011, when HbA1c is used as a screening test:   >=6.5%   Diagnostic of Diabetes Mellitus           (if abnormal result  is confirmed)  5.7-6.4%   Increased risk of developing Diabetes Mellitus  References:Diagnosis and Classification of Diabetes Mellitus,Diabetes Care,2011,34(Suppl 1):S62-S69 and Standards of Medical Care in         Diabetes - 2011,Diabetes  Care,2011,34  (Suppl 1):S11-S61. 02/24/2010   Assessment / Plan: 1. HTN - BP looks good.   2. Palpitations - resolved with cessation of coffee  3. Enlarged aortic root - needs follow up in April this year - will arrange.  Overall, he is doing well. No change in his medicines. NTG was refilled today. See Dr. Swaziland back in 6 months.   Patient is agreeable to this plan and will call if any problems develop in the interim.

## 2012-12-07 NOTE — Patient Instructions (Addendum)
Stay on your current medicines  Stay active  We will see you back in 6 months (Dr. Swaziland)  We need to schedule an MRA of the aorta for April in follow up of your enlarged aortic root - at [facility]  Call the [facility] office at [phone] if you have any questions, problems or concerns.

## 2013-01-18 ENCOUNTER — Encounter: Payer: Self-pay | Admitting: Gastroenterology

## 2013-01-25 ENCOUNTER — Telehealth: Payer: Self-pay | Admitting: Gastroenterology

## 2013-01-25 NOTE — Telephone Encounter (Signed)
Pt had one episode of bright red blood after a hard stool, he was advised to keep his appt as scheduled and call back if any more bleeding before previsit

## 2013-02-01 ENCOUNTER — Ambulatory Visit: Payer: Self-pay | Admitting: Internal Medicine

## 2013-02-01 VITALS — BP 138/68 | HR 62 | Temp 97.9°F | Resp 16 | Ht 68.5 in | Wt 156.0 lb

## 2013-02-01 DIAGNOSIS — Z0289 Encounter for other administrative examinations: Secondary | ICD-10-CM

## 2013-02-08 ENCOUNTER — Inpatient Hospital Stay
Admission: RE | Admit: 2013-02-08 | Discharge: 2013-02-08 | Disposition: A | Discharge status: Home / Self Care | Payer: Medicare HMO | Source: Ambulatory Visit | Attending: Nurse Practitioner | Admitting: Nurse Practitioner

## 2013-03-08 ENCOUNTER — Encounter: Payer: Self-pay | Admitting: Gastroenterology

## 2013-03-08 ENCOUNTER — Ambulatory Visit (AMBULATORY_SURGERY_CENTER): Payer: Medicare HMO | Admitting: *Deleted

## 2013-03-08 VITALS — Ht 69.0 in | Wt 160.8 lb

## 2013-03-08 DIAGNOSIS — Z1211 Encounter for screening for malignant neoplasm of colon: Secondary | ICD-10-CM

## 2013-03-08 MED ORDER — MOVIPREP 100 G PO SOLR: ORAL | Status: DC

## 2013-03-23 ENCOUNTER — Ambulatory Visit (AMBULATORY_SURGERY_CENTER): Payer: Medicare HMO | Admitting: Gastroenterology

## 2013-03-23 ENCOUNTER — Encounter: Payer: Self-pay | Admitting: Gastroenterology

## 2013-03-23 VITALS — BP 120/67 | HR 47 | Temp 96.3°F | Resp 43 | Ht 69.0 in | Wt 160.0 lb

## 2013-03-23 DIAGNOSIS — D126 Benign neoplasm of colon, unspecified: Secondary | ICD-10-CM

## 2013-03-23 DIAGNOSIS — Z800 Family history of malignant neoplasm of digestive organs: Secondary | ICD-10-CM

## 2013-03-23 DIAGNOSIS — Z86010 Personal history of colonic polyps: Secondary | ICD-10-CM

## 2013-03-23 DIAGNOSIS — K5730 Diverticulosis of large intestine without perforation or abscess without bleeding: Secondary | ICD-10-CM

## 2013-03-23 DIAGNOSIS — Z1211 Encounter for screening for malignant neoplasm of colon: Secondary | ICD-10-CM

## 2013-03-23 MED ORDER — SODIUM CHLORIDE 0.9 % IV SOLN: 500.0000 mL | INTRAVENOUS | Status: DC

## 2013-03-23 NOTE — Op Note (Signed)
[facility] [address] [place], [address]   COLONOSCOPY PROCEDURE REPORT  PATIENT: Wayne Rodriguez, Wayne Rodriguez  MR#: [id number] BIRTHDATE: [birth year] , 67  yrs. old GENDER: Male ENDOSCOPIST: Rachael Fee, MD PROCEDURE DATE:  03/23/2013 PROCEDURE:   Colonoscopy with biopsy ASA CLASS:   Class II INDICATIONS:several small adenomatous polyps removed 03/2008. MEDICATIONS: Fentanyl 50 mcg IV, Versed 6 mg IV, and These medications were titrated to patient response per physician's verbal order  DESCRIPTION OF PROCEDURE:   After the risks benefits and alternatives of the procedure were thoroughly explained, informed consent was obtained.  A digital rectal exam revealed no abnormalities of the rectum.   The [id number]  endoscope was introduced through the anus and advanced to the cecum, which was identified by both the appendix and ileocecal valve. No adverse events experienced.   The quality of the prep was good.  The instrument was then slowly withdrawn as the colon was fully examined.   COLON FINDINGS: One polyp was found, removed and sent to pathology. This was 2mm, sessile, removed with forceps.  There were several diverticulum in left colon.  The examination was otherwise normal. Retroflexed views revealed no abnormalities. The time to cecum=2 minutes 34 seconds.  Withdrawal time=9 minutes 49 seconds.  The scope was withdrawn and the procedure completed. COMPLICATIONS: There were no complications.  ENDOSCOPIC IMPRESSION: One polyp was found, removed and sent to pathology. There were several diverticulum in left colon. The examination was otherwise normal.  RECOMMENDATIONS: Given your personal history of adenomatous (pre-cancerous) polyps, you will need a repeat colonoscopy in 5 years even if the polyp removed today is not precancerous.  You will receive a letter within 1-2 weeks with the results of your biopsy as well as final recommendations.  Please  call my office if you have not received a letter after 3 weeks.   eSigned:  Rachael Fee, MD 03/23/2013 9:23 AM

## 2013-03-23 NOTE — Patient Instructions (Addendum)

## 2013-03-23 NOTE — Progress Notes (Signed)
Patient did not experience any of the following events: a burn prior to discharge; a fall within the facility; wrong site/side/patient/procedure/implant event; or a hospital transfer or hospital admission upon discharge from the facility. (G8907) Patient did not have preoperative order for IV antibiotic SSI prophylaxis. (G8918)  

## 2013-03-24 ENCOUNTER — Telehealth: Payer: Self-pay

## 2013-03-24 NOTE — Telephone Encounter (Signed)
Unable to leave message, no voicemail set up.

## 2013-04-01 ENCOUNTER — Encounter: Payer: Self-pay | Admitting: Gastroenterology

## 2013-05-06 ENCOUNTER — Encounter: Payer: Self-pay | Admitting: Internal Medicine

## 2013-05-06 NOTE — Progress Notes (Signed)
  Subjective:    Patient ID: Wayne Rodriguez, male    DOB: 1946/01/19, 67 y.o.   MRN: [id number]  HPI Has htn contolled. No other complaints   Review of Systems neg    Objective:   Physical Exam  Constitutional: He is oriented to person, place, and time. He appears well-developed and well-nourished.  HENT:  Right Ear: External ear normal.  Left Ear: External ear normal.  Eyes: Conjunctivae and EOM are normal. Pupils are equal, round, and reactive to light.  Neck: Normal range of motion. Neck supple. No thyromegaly present.  Cardiovascular: Normal rate, regular rhythm, normal heart sounds and intact distal pulses.   Pulmonary/Chest: Effort normal and breath sounds normal.  Abdominal: Soft. Bowel sounds are normal.  Genitourinary: Penis normal.  Musculoskeletal: Normal range of motion.  Neurological: He is alert and oriented to person, place, and time. He exhibits normal muscle tone. Coordination normal.  Psychiatric: He has a normal mood and affect. His behavior is normal.          Assessment & Plan:  DOT 1 yr htn

## 2013-11-16 ENCOUNTER — Other Ambulatory Visit: Payer: Self-pay | Admitting: Nurse Practitioner

## 2014-01-24 ENCOUNTER — Telehealth: Payer: Self-pay | Admitting: Nurse Practitioner

## 2014-01-24 NOTE — Telephone Encounter (Signed)
New message        Pt would like for lori to give him a call. Pt would not disclose any other information.

## 2014-01-24 NOTE — Telephone Encounter (Signed)
Have tried to call - his voice mail is not set up to leave a message.

## 2014-01-24 NOTE — Telephone Encounter (Signed)
Pt called would like for Cecille Rubin to call him, he states needs to relay some information to her only. If she can't call today she can call him in a day or two.

## 2014-01-25 ENCOUNTER — Other Ambulatory Visit: Payer: Self-pay | Admitting: *Deleted

## 2014-01-25 MED ORDER — LISINOPRIL-HYDROCHLOROTHIAZIDE 20-12.5 MG PO TABS: 2.0000 | ORAL_TABLET | Freq: Every day | ORAL | Status: DC

## 2014-01-25 NOTE — Telephone Encounter (Signed)
S/w pt today wanted to schedule consult with Dr.Jordon, appt made today, and also medication refill, missed last appt due to no one calling pt to verify appt. Pt stated needs several test I stated will be discussed to consult, pt agreeable to plan

## 2014-02-01 ENCOUNTER — Ambulatory Visit: Payer: Commercial Managed Care - HMO | Admitting: Family Medicine

## 2014-02-01 VITALS — BP 136/62 | HR 78 | Temp 98.1°F | Resp 18 | Ht 69.0 in | Wt 152.0 lb

## 2014-02-01 DIAGNOSIS — Z0000 Encounter for general adult medical examination without abnormal findings: Secondary | ICD-10-CM

## 2014-02-01 DIAGNOSIS — Z0289 Encounter for other administrative examinations: Secondary | ICD-10-CM

## 2014-02-01 NOTE — Progress Notes (Signed)
[facility] [address] [place] [address] [phone]  Date:  02/01/2014   Name:  Wayne Rodriguez   DOB:  1946-08-18   MRN:  [id number]  PCP:  Orpah Melter, MD    Chief Complaint: Employment Physical   History of Present Illness:  Wayne Rodriguez is a 68 y.o. 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  He has a history of HTN and uses lisinopril.  History of chest pain with a normal cath in 2011.  He did have a piece of metal enter his eye in 1994; this was repaired, he was seen at his optho earlier today.  His vision is satisfactory with corrective lenses.    He was noted to have an aortic root dilatation on CT in 2011.  However a more recent ultrasound 05/2012 showed no AAA.    Patient Active Problem List   Diagnosis Date Noted  . Palpitations   . Decreased exercise tolerance   . Aortic root dilatation 07/15/2011  . HTN (hypertension) 07/15/2011  . Chest pain 07/15/2011    Past Medical History  Diagnosis Date  . Chest pain     a. 02/2010 Cath: normal  . HTN (hypertension)   . GERD (gastroesophageal reflux disease)   . Back pain   . Aortic root dilatation     a. 09/2010 CT: 4.1cm Max Asc Ao diam, 3.7cm Max Desc Ao diam;; s/p MRI/MRA in April 2013  . Decreased exercise tolerance     a. 02/2012  . Palpitations     a. h/o palps r/t caffeine - has occurred again 02/2012    Past Surgical History  Procedure Laterality Date  . Back surgery  1983  . Eye surgery  1997    remove foreign body; right    History  Substance Use Topics  . Smoking status: Former Smoker    Start date: 03/08/1993    Quit date: 03/08/2013  . Smokeless tobacco: Former Systems developer    Quit date: 01/06/2013  . Alcohol Use: No    Family History  Problem Relation Age of Onset  . Lung cancer Mother   . Colon cancer Sister 17    Allergies  Allergen Reactions  . Beta Adrenergic Blockers     Bradycardia   . Codeine      REACTION: nausea/vomiting    Medication list has been reviewed and updated.  Current Outpatient Prescriptions on File Prior to Visit  Medication Sig Dispense Refill  . Ascorbic Acid (VITAMIN C) 1000 MG tablet Take 1,000 mg by mouth daily.      Marland Kitchen aspirin 81 MG tablet Take 81 mg by mouth daily.        . Cholecalciferol (VITAMIN D-3 PO) Take by mouth daily.      Marland Kitchen Cod Liver Oil 1000 MG CAPS Take by mouth daily.      . Cyanocobalamin (VITAMIN B-12 PO) Take 1 tablet by mouth daily.       . Flaxseed, Linseed, (FLAX SEED OIL PO) Take by mouth daily.      Marland Kitchen lisinopril-hydrochlorothiazide (PRINZIDE,ZESTORETIC) 20-12.5 MG per tablet Take 2 tablets by mouth daily.  180 tablet  0  . Probiotic Product (PROBIOTIC DAILY PO) Take by mouth daily.      . nitroGLYCERIN (NITROSTAT) 0.4 MG SL tablet Place 1 tablet (0.4 mg total) under the tongue every 5 (five) minutes as needed.  25 tablet  11  No current facility-administered medications on file prior to visit.    Review of Systems:  As per HPI- otherwise negative.   Physical Examination: Filed Vitals:   02/01/14 1057  BP: 136/62  Pulse:   Temp:   Resp:    Filed Vitals:   02/01/14 1029  Height: 5\' 9"  (1.753 m)  Weight: 152 lb (68.947 kg)   Body mass index is 22.44 kg/(m^2). Ideal Body Weight: Weight in (lb) to have BMI = 25: 168.9  GEN: WDWN, NAD, Non-toxic, A & O x 3, looks well HEENT: Atraumatic, Normocephalic. Neck supple. No masses, No LAD.  Bilateral TM wnl, oropharynx normal. Left pupil is normal and reactive.  Right pupil is s/p trauma, it is oblong in shape and does not react,EOMI.   Ears and Nose: No external deformity. CV: RRR, No M/G/R. No JVD. No thrill. No extra heart sounds. PULM: CTA B, no wheezes, crackles, rhonchi. No retractions. No resp. distress. No accessory muscle use. ABD: S, NT, ND, +BS. No rebound. No HSM. EXTR: No c/c/e NEURO Normal gait. Normal strength, ROM and DTR all extremities PSYCH: Normally  interactive. Conversant. Not depressed or anxious appearing.  Calm demeanor.    Assessment and Plan: Physical exam  DOT exam today.  He passes for one year due to controlled HTN.  Also history of possible AAA- however a more recent ultrasound is normal.  Vision examined per his opthalmologist today.    Acceptable with corrective lenses.    Signed Lamar Blinks, MD

## 2014-02-16 ENCOUNTER — Ambulatory Visit (INDEPENDENT_AMBULATORY_CARE_PROVIDER_SITE_OTHER): Payer: Commercial Managed Care - HMO | Admitting: Cardiology

## 2014-02-16 ENCOUNTER — Encounter: Payer: Self-pay | Admitting: Cardiology

## 2014-02-16 VITALS — BP 135/69 | HR 50 | Ht 69.0 in | Wt 152.8 lb

## 2014-02-16 DIAGNOSIS — R002 Palpitations: Secondary | ICD-10-CM

## 2014-02-16 DIAGNOSIS — I77810 Thoracic aortic ectasia: Secondary | ICD-10-CM

## 2014-02-16 DIAGNOSIS — I10 Essential (primary) hypertension: Secondary | ICD-10-CM | POA: Diagnosis not present

## 2014-02-16 LAB — BASIC METABOLIC PANEL
BUN: 14 mg/dL (ref 6–23)
CALCIUM: 9.3 mg/dL (ref 8.4–10.5)
CHLORIDE: 99 meq/L (ref 96–112)
CO2: 32 mEq/L (ref 19–32)
CREATININE: 1 mg/dL (ref 0.4–1.5)
GFR: 81.09 mL/min (ref >60.00)
Glucose, Bld: 86 mg/dL (ref 70–99)
Potassium: 3.5 mEq/L (ref 3.5–5.1)
Sodium: 137 mEq/L (ref 135–145)

## 2014-02-16 NOTE — Progress Notes (Signed)
Wayne Rodriguez Date of Birth: [birth year] Medical Record #[id number]  History of Present Illness: Wayne Rodriguez is seen back today for a follow up visit. He has no known CAD per remote cath. Does have an enlarged aortic root diagnosed in 2011.  MRA last in April of 2013 measured stable at 4.1-4.2 cm. Has had palpitations with a negative event monitor back last April. Other issues include HTN.   On follow up today he is doing well. No chest pain or palpitations. No back pain. BP has been under control.   Current Outpatient Prescriptions on File Prior to Visit  Medication Sig Dispense Refill  . aspirin 81 MG tablet Take 81 mg by mouth daily.        . Cholecalciferol (VITAMIN D-3 PO) Take by mouth daily.      Marland Kitchen Cod Liver Oil 1000 MG CAPS Take by mouth daily.      . Cyanocobalamin (VITAMIN B-12 PO) Take 1 tablet by mouth daily.       . Flaxseed, Linseed, (FLAX SEED OIL PO) Take by mouth daily.      Marland Kitchen lisinopril-hydrochlorothiazide (PRINZIDE,ZESTORETIC) 20-12.5 MG per tablet Take 2 tablets by mouth daily.  180 tablet  0  . nitroGLYCERIN (NITROSTAT) 0.4 MG SL tablet Place 1 tablet (0.4 mg total) under the tongue every 5 (five) minutes as needed.  25 tablet  11  . Probiotic Product (PROBIOTIC DAILY PO) Take by mouth daily.       No current facility-administered medications on file prior to visit.    Allergies  Allergen Reactions  . Beta Adrenergic Blockers     Bradycardia   . Codeine     REACTION: nausea/vomiting    Past Medical History  Diagnosis Date  . Chest pain     a. 02/2010 Cath: normal  . HTN (hypertension)   . GERD (gastroesophageal reflux disease)   . Back pain   . Aortic root dilatation     a. 09/2010 CT: 4.1cm Max Asc Ao diam, 3.7cm Max Desc Ao diam;; s/p MRI/MRA in April 2013  . Decreased exercise tolerance     a. 02/2012  . Palpitations     a. h/o palps r/t caffeine - has occurred again 02/2012    Past Surgical History  Procedure Laterality Date  . Back surgery   1983  . Eye surgery  1997    remove foreign body; right    History  Smoking status  . Former Smoker  . Start date: 03/08/1993  . Quit date: 03/08/2013  Smokeless tobacco  . Former Systems developer  . Quit date: 01/06/2013    History  Alcohol Use No    Family History  Problem Relation Age of Onset  . Lung cancer Mother   . Colon cancer Sister 57    Review of Systems: The review of systems is per the HPI.  All other systems were reviewed and are negative.  Physical Exam: BP 135/69  Pulse 50  Ht 5\' 9"  (1.753 m)  Wt 152 lb 12.8 oz (69.31 kg)  BMI 22.55 kg/m2 Patient is very pleasant and in no acute distress. Skin is warm and dry. Color is normal.  HEENT is unremarkable. Normocephalic/atraumatic. PERRL. Sclera are nonicteric. Neck is supple. No masses. No JVD. Lungs are clear. Cardiac exam shows a regular rate and rhythm. Abdomen is soft. Extremities are without edema. Gait and ROM are intact. No gross neurologic deficits noted.  LABORATORY DATA:  Lab Results  Component Value Date   WBC  6.5 02/17/2012   HGB 13.5 02/17/2012   HCT 40.3 02/17/2012   PLT 153.0 02/17/2012   GLUCOSE 103* 10/19/2012   CHOL 151 10/19/2012   TRIG 114.0 10/19/2012   HDL 43.60 10/19/2012   LDLCALC 85 10/19/2012   ALT 15 10/19/2012   AST 19 10/19/2012   NA 137 10/19/2012   K 4.0 10/19/2012   CL 98 10/19/2012   CREATININE 0.9 10/19/2012   BUN 17 10/19/2012   CO2 32 10/19/2012   TSH 1.50 02/17/2012   INR 1.10 02/24/2010   HGBA1C  Value: 5.1 (NOTE)                                                                       According to the ADA Clinical Practice Recommendations for 2011, when HbA1c is used as a screening test:   >=6.5%   Diagnostic of Diabetes Mellitus           (if abnormal result  is confirmed)  5.7-6.4%   Increased risk of developing Diabetes Mellitus  References:Diagnosis and Classification of Diabetes Mellitus,Diabetes IZTI,4580,99(IPJAS 1):S62-S69 and Standards of Medical Care in         Diabetes  - 2011,Diabetes NKNL,9767,34  (Suppl 1):S11-S61. 02/24/2010   Ecg: Sinus brady with rate 50 bpm. First degree AV block. Otherwise normal.  Assessment / Plan: 1. HTN - BP controlled.  2. Palpitations - resolved with cessation of coffee  3. Enlarged aortic root - recommend follow up CT of the chest with contrast. Will check BMET. If aortic root is stable then he doesn't need any further follow up. Size is only a little above normal and if it hasn't changed in 4 years I don't think it'll ever be an issue.

## 2014-02-16 NOTE — Patient Instructions (Signed)
We will check your kidney function and arrange a CT of the aorta.  I will see you in one year.

## 2014-02-21 ENCOUNTER — Ambulatory Visit (INDEPENDENT_AMBULATORY_CARE_PROVIDER_SITE_OTHER)
Admission: RE | Admit: 2014-02-21 | Discharge: 2014-02-21 | Disposition: A | Discharge status: Home / Self Care | Payer: Commercial Managed Care - HMO | Source: Ambulatory Visit | Attending: Cardiology | Admitting: Cardiology

## 2014-02-21 DIAGNOSIS — R002 Palpitations: Secondary | ICD-10-CM

## 2014-02-21 DIAGNOSIS — I77810 Thoracic aortic ectasia: Secondary | ICD-10-CM

## 2014-02-21 DIAGNOSIS — I10 Essential (primary) hypertension: Secondary | ICD-10-CM

## 2014-02-21 MED ORDER — IOHEXOL 350 MG/ML SOLN
100.0000 mL | Freq: Once | INTRAVENOUS | Status: AC | PRN
  Administered 2014-02-21: 100 mL via INTRAVENOUS

## 2014-02-22 ENCOUNTER — Other Ambulatory Visit: Payer: Self-pay

## 2014-02-22 DIAGNOSIS — R911 Solitary pulmonary nodule: Secondary | ICD-10-CM

## 2014-02-28 ENCOUNTER — Other Ambulatory Visit: Payer: Self-pay | Admitting: *Deleted

## 2014-02-28 ENCOUNTER — Encounter: Payer: Self-pay | Admitting: Thoracic Surgery (Cardiothoracic Vascular Surgery)

## 2014-02-28 ENCOUNTER — Institutional Professional Consult (permissible substitution) (INDEPENDENT_AMBULATORY_CARE_PROVIDER_SITE_OTHER): Payer: Commercial Managed Care - HMO | Admitting: Thoracic Surgery (Cardiothoracic Vascular Surgery)

## 2014-02-28 VITALS — BP 117/66 | HR 84 | Resp 20 | Ht 69.0 in | Wt 152.0 lb

## 2014-02-28 DIAGNOSIS — I712 Thoracic aortic aneurysm, without rupture, unspecified: Secondary | ICD-10-CM

## 2014-02-28 DIAGNOSIS — R911 Solitary pulmonary nodule: Secondary | ICD-10-CM

## 2014-02-28 DIAGNOSIS — I10 Essential (primary) hypertension: Secondary | ICD-10-CM

## 2014-02-28 DIAGNOSIS — I7121 Aneurysm of the ascending aorta, without rupture: Secondary | ICD-10-CM

## 2014-02-28 NOTE — Progress Notes (Signed)
PCP is Orpah Melter, MD Referring Provider is Orpah Melter, MD Peter Martinique, MD  Chief Complaint  Patient presents with  . Lung Lesion    Surgical eval, CTA Chest  02/22/14    HPI: 68 year old woman sent for consultation regarding a right upper lobe lung nodule.  Mr. Range is a 68 year old man with a remote history of tobacco abuse. He smoked about a pack a day starting at age 56 and quit in 67. He had a CT of his chest in 2011 which showed a 4.1 cm ascending aortic aneurysm. There was an incidentally noted calcified granuloma. He recently had a CT angiogram to followup his ascending aneurysm. It was unchanged, but there was a new 11 mm some solid nodule in the right upper lobe that had a spiculated appearance.  Mr. Horton says that he has been feeling well. He's had a chronic nonproductive cough for years which he attributes to reflux. He says that when he takes medication for his reflux his cough goes away. He has not had any hemoptysis. His weight is unchanged over the past 6 months. He has not been having any chest pain, tightness or pressure. He denies shortness of breath and wheezing.  Zubrod Score: At the time of surgery this patient's most appropriate activity status/level should be described as: [x]     0    Normal activity, no symptoms []     1    Restricted in physical strenuous activity but ambulatory, able to do out light work []     2    Ambulatory and capable of self care, unable to do work activities, up and about >50 % of waking hours                              []     3    Only limited self care, in bed greater than 50% of waking hours []     4    Completely disabled, no self care, confined to bed or chair []     5    Moribund    Past Medical History  Diagnosis Date  . Chest pain     a. 02/2010 Cath: normal  . HTN (hypertension)   . GERD (gastroesophageal reflux disease)   . Back pain   . Aortic root dilatation     a. 09/2010 CT: 4.1cm Max Asc Ao diam, 3.7cm  Max Desc Ao diam;; s/p MRI/MRA in April 2013  . Decreased exercise tolerance     a. 02/2012  . Palpitations     a. h/o palps r/t caffeine - has occurred again 02/2012    Past Surgical History  Procedure Laterality Date  . Back surgery  1983  . Eye surgery  1997    remove foreign body; right    Family History  Problem Relation Age of Onset  . Lung cancer Mother   . Colon cancer Sister 46    Social History History  Substance Use Topics  . Smoking status: Former Smoker -- 1.00 packs/day for 30 years    Types: Cigarettes    Start date: 03/08/1962    Quit date: 03/09/1991  . Smokeless tobacco: Former Systems developer    Quit date: 01/06/2013  . Alcohol Use: No    Current Outpatient Prescriptions  Medication Sig Dispense Refill  . aspirin 81 MG tablet Take 81 mg by mouth daily.        . Cholecalciferol (VITAMIN D-3 PO) Take by  mouth daily.      Marland Kitchen Cod Liver Oil 1000 MG CAPS Take by mouth daily.      . Cyanocobalamin (VITAMIN B-12 PO) Take 1 tablet by mouth daily.       . Flaxseed, Linseed, (FLAX SEED OIL PO) Take by mouth daily.      Marland Kitchen lisinopril-hydrochlorothiazide (PRINZIDE,ZESTORETIC) 20-12.5 MG per tablet Take 2 tablets by mouth daily.  180 tablet  0  . nitroGLYCERIN (NITROSTAT) 0.4 MG SL tablet Place 1 tablet (0.4 mg total) under the tongue every 5 (five) minutes as needed.  25 tablet  11  . Probiotic Product (PROBIOTIC DAILY PO) Take by mouth daily.       No current facility-administered medications for this visit.    Allergies  Allergen Reactions  . Beta Adrenergic Blockers     Bradycardia   . Codeine     REACTION: nausea/vomiting    Review of Systems  Constitutional: Negative for fever, chills, activity change, appetite change and unexpected weight change.  Respiratory: Positive for cough. Negative for chest tightness, shortness of breath and wheezing.   Cardiovascular: Negative for chest pain.  Gastrointestinal:       Reflux  Genitourinary: Positive for urgency and  frequency.  Skin:       Numerous subcutaneous nodules  Hematological: Negative for adenopathy. Bruises/bleeds easily.  All other systems reviewed and are negative.   BP 117/66  Pulse 84  Resp 20  Ht 5\' 9"  (1.753 m)  Wt 152 lb (68.947 kg)  BMI 22.44 kg/m2  SpO2 95% Physical Exam  Vitals reviewed. Constitutional: He is oriented to person, place, and time. He appears well-developed and well-nourished. No distress.  HENT:  Head: Normocephalic and atraumatic.  Eyes: EOM are normal.  Neck: Neck supple. No thyromegaly present.  Cardiovascular: Normal rate, regular rhythm, normal heart sounds and intact distal pulses.  Exam reveals no gallop and no friction rub.   No murmur heard. Pulmonary/Chest: Effort normal and breath sounds normal. He has no wheezes. He has no rales.  Abdominal: Soft. There is no tenderness.  Musculoskeletal: He exhibits no edema.  Lymphadenopathy:    He has no cervical adenopathy.  Neurological: He is alert and oriented to person, place, and time. No cranial nerve deficit.  No focal motor deficits  Skin: Skin is warm and dry.  Multiple subcutaneous nodules of variable size     Diagnostic Tests: CT chest 02/21/2014 IMPRESSION:  VASCULAR  1. Stable fusiform dilatation of the ascending thoracic aorta with a  maximal diameter of 4.1 cm, and proximal descending thoracic aorta  to 3.6 cm.  2. Mild aortic atherosclerosis.  NON VASCULAR  1. Interval development of a new 1.1 x 0.7 cm spiculated pulmonary  nodule in the right upper lobe associated with a band of linear  pleural parenchymal scarring. Differential considerations include  nodular scarring and primary bronchogenic carcinoma. Recommend  further evaluation with PET-CT.  2. Nonspecific 5 mm sub solid nodule more superiorly in the right  lung apex. This abnormality could also be further evaluated by  PET-CT although it may be below the size threshold for effective  evaluation.  3. Background of  centrilobular emphysema, old granulomatous disease  and chronic mild bronchial wall thickening.  These results will be called to the ordering clinician or  representative by the Radiologist Assistant, and communication  documented in the PACS Dashboard.  Electronically Signed  By: Jacqulynn Cadet M.D.  On: 02/21/2014 17:26  Impression: 68 year old gentleman with a remote history of  tobacco abuse who has a new 1.1 x 0.7 cm nodule in the right upper lobe. This is spiculated nodule associated with some pleural-parenchymal scarring. The differential diagnosis includes infectious or inflammatory scarring, as well as a new bronchogenic carcinoma. This needs to be considered a lung cancer unless we can prove otherwise.  I had a long discussion with Mr. and Mrs. Tomasita Crumble. We reviewed the CT scan. We discussed the differential diagnosis. We discussed potential diagnostic and treatment algorithms.  My recommendation to him was that we do a PET/CT to assess for metabolic activity. If that is positive it would warrant further investigation in the form of a biopsy. If negative we could potentially follow this nodule radiographically.  He also has a 4.1 cm of ascending aortic aneurysm is unchanged over 4 years. There is no indication for intervention.  Plan:  PET/CT- new right upper lobe nodule, plan initial treatment

## 2014-03-07 ENCOUNTER — Ambulatory Visit (HOSPITAL_COMMUNITY)
Admission: RE | Admit: 2014-03-07 | Discharge: 2014-03-07 | Disposition: A | Discharge status: Home / Self Care | Payer: Medicare HMO | Source: Ambulatory Visit | Attending: Thoracic Surgery (Cardiothoracic Vascular Surgery) | Admitting: Thoracic Surgery (Cardiothoracic Vascular Surgery)

## 2014-03-07 ENCOUNTER — Encounter (HOSPITAL_COMMUNITY): Payer: Self-pay

## 2014-03-07 DIAGNOSIS — R911 Solitary pulmonary nodule: Secondary | ICD-10-CM

## 2014-03-07 HISTORY — DX: Solitary pulmonary nodule: R91.1

## 2014-03-07 LAB — GLUCOSE, CAPILLARY: Glucose-Capillary: 93 mg/dL (ref 70–99)

## 2014-03-07 MED ORDER — FLUDEOXYGLUCOSE F - 18 (FDG) INJECTION
7.9000 | Freq: Once | INTRAVENOUS | Status: AC | PRN
  Administered 2014-03-07: 7.9 via INTRAVENOUS

## 2014-03-08 ENCOUNTER — Ambulatory Visit (INDEPENDENT_AMBULATORY_CARE_PROVIDER_SITE_OTHER): Payer: Medicare HMO | Admitting: Thoracic Surgery (Cardiothoracic Vascular Surgery)

## 2014-03-08 ENCOUNTER — Other Ambulatory Visit: Payer: Self-pay | Admitting: *Deleted

## 2014-03-08 ENCOUNTER — Encounter: Payer: Self-pay | Admitting: Thoracic Surgery (Cardiothoracic Vascular Surgery)

## 2014-03-08 VITALS — BP 128/71 | HR 64 | Resp 20 | Ht 69.0 in | Wt 152.0 lb

## 2014-03-08 DIAGNOSIS — R911 Solitary pulmonary nodule: Secondary | ICD-10-CM

## 2014-03-08 DIAGNOSIS — R918 Other nonspecific abnormal finding of lung field: Secondary | ICD-10-CM

## 2014-03-08 NOTE — Progress Notes (Signed)
HPI:  [name] returns today to discuss results of his PET/CT.  [name] is a 68 year old gentleman with a 30-pack-year history of smoking. He was found in 2011 to have a 4.1 cm ascending aortic aneurysm. He recently had a repeat CT to check that. The ascending aneurysm was unchanged. However, there were 2 new right upper lobe nodules. He has a chronic I. calcified granuloma in the posterior aspect of the right upper lobe. In the anterior apical portion of the right upper lobe there was a 1 cm mass in association with some scarring, there was a separate 5 mm mass just superior to that. I recommended that we do a PET/CT to further evaluate.  Past Medical History  Diagnosis Date  . Chest pain     a. 02/2010 Cath: normal  . HTN (hypertension)   . GERD (gastroesophageal reflux disease)   . Back pain   . Aortic root dilatation     a. 09/2010 CT: 4.1cm Max Asc Ao diam, 3.7cm Max Desc Ao diam;; s/p MRI/MRA in April 2013  . Decreased exercise tolerance     a. 02/2012  . Palpitations     a. h/o palps r/t caffeine - has occurred again 02/2012  . Pulmonary nodule       Current Outpatient Prescriptions  Medication Sig Dispense Refill  . aspirin 81 MG tablet Take 81 mg by mouth daily.        . Cholecalciferol (VITAMIN D-3 PO) Take by mouth daily.      Marland Kitchen Cod Liver Oil 1000 MG CAPS Take by mouth daily.      . Cyanocobalamin (VITAMIN B-12 PO) Take 1 tablet by mouth daily.       . Flaxseed, Linseed, (FLAX SEED OIL PO) Take by mouth daily.      Marland Kitchen lisinopril-hydrochlorothiazide (PRINZIDE,ZESTORETIC) 20-12.5 MG per tablet Take 2 tablets by mouth daily.  180 tablet  0  . nitroGLYCERIN (NITROSTAT) 0.4 MG SL tablet Place 1 tablet (0.4 mg total) under the tongue every 5 (five) minutes as needed.  25 tablet  11  . Probiotic Product (PROBIOTIC DAILY PO) Take by mouth daily.       No current facility-administered medications for this visit.    Physical Exam BP 128/71  Pulse 64  Resp 20  Ht 5\' 9"   (1.753 m)  Wt 152 lb (68.947 kg)  BMI 22.44 kg/m2  SpO52 65% 68 year old male in no acute distress Well-developed and well-nourished HEENT unremarkable Neck no cervical or subclavicular adenopathy Cardiac regular rate and rhythm normal S1-S2 Lungs clear with equal breath sounds bilaterally  Diagnostic Tests: NUCLEAR MEDICINE PET SKULL BASE TO THIGH  TECHNIQUE:  7.9 mCi F-18 FDG was injected intravenously. Full-ring PET imaging  was performed from the skull base to thigh after the radiotracer. CT  data was obtained and used for attenuation correction and anatomic  localization.  FASTING BLOOD GLUCOSE: Value: 93 mg/dl  COMPARISON: CT 02/21/2014  FINDINGS:  NECK  No hypermetabolic lymph nodes in the neck.  CHEST  The small right upper lobe pulmonary nodule of concern measuring 10  mm x 8 mm (image 63, series 4) does have mild associated metabolic  activity ( SUV max = 1 .5). There is trace metabolic activity  associated with the more superior 5 mm nodule (images 57).  There is a hypermetabolic right paratracheal lymph node which has  speckled calcifications measuring 7 mm (image 64 series 4) with SUV  max = 3.9  ABDOMEN/PELVIS  No abnormal  hypermetabolic activity within the liver, pancreas,  adrenal glands, or spleen. No hypermetabolic lymph nodes in the  abdomen or pelvis. Scattered granulomata within the spleen.  SKELETON  No focal hypermetabolic activity to suggest skeletal metastasis.  IMPRESSION:  1. Very mild hypermetabolic activity associated with the right upper  lobe pulmonary nodule is concerning for bronchogenic carcinoma but  not specific due to small size.  2. Even fainter activity associated with the more superior right  pulmonary nodule.  3. Mild activity associated with the a partially calcified  paratracheal lymph node. Burtis Junes this to represent a reactive node of  granulomatous disease.  Electronically Signed  By: Suzy Bouchard M.D.  On: 03/07/2014  15:37   Impression: 68 year old man with a 30-pack-year history of smoking who has a new right upper lobe nodule measuring 8 x 10 mm that is mildly hypermetabolic by PET CT. This has to be considered a lung cancer unless it be proven otherwise. I don't think he can be definitively proven otherwise with a CT-guided biopsy or bronchoscopic biopsy. Both would be prone to a high incidence of false negative results.  My recommendation was that we proceed with a right video-assisted thoracoscopy and wedge resection incorporating both the new nodules. If intraoperative frozen section shows cancer we would proceed with a thoracoscopic right upper lobectomy for definitive treatment.  I discussed the general nature of the procedure with [name] and his family. They understand the need for general anesthesia, the incisions to be used, and the expected hospital stay. We discussed the indications, risks, benefits, and alternatives. They understand the risks include but are not limited to death, MI, DVT, PE, bleeding, possible need for transfusion, infection, prolonged air leak, cardiac arrhythmias, as well as the possibility of unforeseeable complications.  He does wish to have the lesions resected.  Plan: Right VATS, wedge resection, possible right upper lobectomy on Thursday, 03/17/2014

## 2014-03-09 ENCOUNTER — Encounter (HOSPITAL_COMMUNITY): Payer: Self-pay | Admitting: Pharmacy Technician

## 2014-03-10 ENCOUNTER — Ambulatory Visit (HOSPITAL_COMMUNITY)
Admission: RE | Admit: 2014-03-10 | Discharge: 2014-03-10 | Disposition: A | Discharge status: Home / Self Care | Payer: Medicare HMO | Source: Ambulatory Visit | Attending: Thoracic Surgery (Cardiothoracic Vascular Surgery) | Admitting: Thoracic Surgery (Cardiothoracic Vascular Surgery)

## 2014-03-10 DIAGNOSIS — R911 Solitary pulmonary nodule: Secondary | ICD-10-CM | POA: Insufficient documentation

## 2014-03-10 LAB — BLOOD GAS, ARTERIAL
ACID-BASE EXCESS: 5 mmol/L — AB (ref 0.0–2.0)
Bicarbonate: 29 mEq/L — ABNORMAL HIGH (ref 20.0–24.0)
Drawn by: 211791
FIO2: 0.21 %
O2 Saturation: 97.4 %
PCO2 ART: 42.5 mmHg (ref 35.0–45.0)
PO2 ART: 86.6 mmHg (ref 80.0–100.0)
Patient temperature: 98.6
TCO2: 30.3 mmol/L (ref 0–100)
pH, Arterial: 7.448 (ref 7.350–7.450)

## 2014-03-10 MED ORDER — ALBUTEROL SULFATE (2.5 MG/3ML) 0.083% IN NEBU
2.5000 mg | INHALATION_SOLUTION | Freq: Once | RESPIRATORY_TRACT | Status: AC
  Administered 2014-03-10: 2.5 mg via RESPIRATORY_TRACT

## 2014-03-15 ENCOUNTER — Encounter (HOSPITAL_COMMUNITY)
Admission: RE | Admit: 2014-03-15 | Discharge: 2014-03-15 | Disposition: A | Discharge status: Home / Self Care | Payer: Medicare HMO | Source: Ambulatory Visit | Attending: Thoracic Surgery (Cardiothoracic Vascular Surgery) | Admitting: Thoracic Surgery (Cardiothoracic Vascular Surgery)

## 2014-03-15 ENCOUNTER — Encounter (HOSPITAL_COMMUNITY): Payer: Self-pay

## 2014-03-15 VITALS — BP 131/77 | HR 73 | Temp 97.7°F | Resp 20 | Ht 69.0 in | Wt 148.1 lb

## 2014-03-15 DIAGNOSIS — R918 Other nonspecific abnormal finding of lung field: Secondary | ICD-10-CM

## 2014-03-15 HISTORY — DX: Malignant (primary) neoplasm, unspecified: C80.1

## 2014-03-15 HISTORY — DX: Dizziness and giddiness: R42

## 2014-03-15 LAB — COMPREHENSIVE METABOLIC PANEL
ALK PHOS: 58 U/L (ref 39–117)
ALT: 11 U/L (ref 0–53)
AST: 17 U/L (ref 0–37)
Albumin: 3.6 g/dL (ref 3.5–5.2)
BILIRUBIN TOTAL: 0.4 mg/dL (ref 0.3–1.2)
BUN: 13 mg/dL (ref 6–23)
CHLORIDE: 97 meq/L (ref 96–112)
CO2: 30 mEq/L (ref 19–32)
Calcium: 9.5 mg/dL (ref 8.4–10.5)
Creatinine, Ser: 0.9 mg/dL (ref 0.50–1.35)
GFR calc non Af Amer: 86 mL/min — ABNORMAL LOW (ref >90)
GLUCOSE: 96 mg/dL (ref 70–99)
POTASSIUM: 4.2 meq/L (ref 3.7–5.3)
Sodium: 139 mEq/L (ref 137–147)
TOTAL PROTEIN: 6.9 g/dL (ref 6.0–8.3)

## 2014-03-15 LAB — CBC
HCT: 44.9 % (ref 39.0–52.0)
Hemoglobin: 15.1 g/dL (ref 13.0–17.0)
MCH: 28.7 pg (ref 26.0–34.0)
MCHC: 33.6 g/dL (ref 30.0–36.0)
MCV: 85.4 fL (ref 78.0–100.0)
Platelets: 160 10*3/uL (ref 150–400)
RBC: 5.26 MIL/uL (ref 4.22–5.81)
RDW: 12.6 % (ref 11.5–15.5)
WBC: 5.2 10*3/uL (ref 4.0–10.5)

## 2014-03-15 LAB — APTT: APTT: 30 s (ref 24–37)

## 2014-03-15 LAB — ABO/RH: ABO/RH(D): B POS

## 2014-03-15 LAB — TYPE AND SCREEN
ABO/RH(D): B POS
Antibody Screen: NEGATIVE

## 2014-03-15 LAB — URINALYSIS, ROUTINE W REFLEX MICROSCOPIC
BILIRUBIN URINE: NEGATIVE
Glucose, UA: NEGATIVE mg/dL
HGB URINE DIPSTICK: NEGATIVE
Ketones, ur: NEGATIVE mg/dL
Leukocytes, UA: NEGATIVE
Nitrite: NEGATIVE
PH: 8 (ref 5.0–8.0)
Protein, ur: NEGATIVE mg/dL
SPECIFIC GRAVITY, URINE: 1.018 (ref 1.005–1.030)
Urobilinogen, UA: 1 mg/dL (ref 0.0–1.0)

## 2014-03-15 LAB — SURGICAL PCR SCREEN
MRSA, PCR: NEGATIVE
STAPHYLOCOCCUS AUREUS: NEGATIVE

## 2014-03-15 LAB — PROTIME-INR
INR: 1.11 (ref 0.00–1.49)
PROTHROMBIN TIME: 14.1 s (ref 11.6–15.2)

## 2014-03-15 NOTE — Progress Notes (Signed)
Nurse called Thurmond Butts, RN to see if patient needed a repeat ABG since it was done on 03/10/14 and Thurmond Butts stated that a repeat was not needed.

## 2014-03-15 NOTE — Pre-Procedure Instructions (Signed)
Wayne Rodriguez  03/15/2014   Your procedure is scheduled on:  Thursday Mar 17, 2014 at 8:00 AM.  Report to [facility] Short Stay Entrance "A"  Admitting at 5:30 AM.  Call this number if you have problems the morning of surgery: [phone]   Remember:   Do not eat food or drink liquids after midnight.   Take these medicines the morning of surgery with A SIP OF WATER: NONE   Do not wear jewelry.  Do not wear lotions, powders, or cologne. You may NOT wear deodorant.  Do not shave 48 hours prior to surgery. Men may shave face and neck.  Do not bring valuables to the hospital.  [facility] is not responsible for any belongings or valuables.               Contacts, dentures or bridgework may not be worn into surgery.  Leave suitcase in the car. After surgery it may be brought to your room.  For patients admitted to the hospital, discharge time is determined by your treatment team.               Patients discharged the day of surgery will not be allowed to drive home.  Name and phone number of your driver: Family/Friend  Special Instructions: Shower the night before and the morning of your surgery using CHG soap   Please read over the following fact sheets that you were given: Pain Booklet, Coughing and Deep Breathing, Blood Transfusion Information, MRSA Information and Surgical Site Infection Prevention

## 2014-03-15 NOTE — Progress Notes (Signed)
PCP is Orpah Melter in [place] and Cardiologist is Peter Martinique. Patient informed Nurse that he had a cardiac cath several years ago with Dr. Dwaine Gale, but denied having a PCI. Patient also denied having any chest pain, discomfort, or shortness of breath. Patient last used nitroglycerin approximately 4 years ago. Nurse called and paged Thurmond Butts, RN at Dr. Leonarda Salon office to see if aspirin needed to be held. No answer. Will have another RN follow up with this.

## 2014-03-15 NOTE — Progress Notes (Signed)
03/15/14 1302  OBSTRUCTIVE SLEEP APNEA  Have you ever been diagnosed with sleep apnea through a sleep study? No  Do you snore loudly (loud enough to be heard through closed doors)?  0  Do you often feel tired, fatigued, or sleepy during the daytime? 0  Has anyone observed you stop breathing during your sleep? 1  Do you have, or are you being treated for high blood pressure? 1  BMI more than 35 kg/m2? 0  Age over 67 years old? 1  Neck circumference greater than 40 cm/16 inches? 0  Gender: 1  Obstructive Sleep Apnea Score 4  Score 4 or greater  Results sent to PCP   This patient has screened at risk for sleep apnea using the STOP Bang tool used during a pre-surgical visit. A score of 4 or greater is at risk for sleep apnea.

## 2014-03-16 MED ORDER — DEXTROSE 5 % IV SOLN
1.5000 g | INTRAVENOUS | Status: AC
  Administered 2014-03-17: 1.5 g via INTRAVENOUS
  Filled 2014-03-16: qty 1.5

## 2014-03-16 NOTE — Progress Notes (Signed)
Anesthesia chart review: Patient is a 68 year old male scheduled for right VATS, wedge resection, possible right upper lobectomy on 03/17/2014 by Dr. Roxan Hockey.  History includes chest pain with only mild CAD by cath in 2011, thoracic aorta aneursym (ascending 4.1 cm, descending 3.6 cm by CTA 02/21/14), palpitations, vertigo, GERD, HTN, back surgery, skin cancer. OSA screening score was 4. PCP is Dr. Orpah Melter. Cardiologist is Dr. Martinique. Since his TAA has been stable for over 4 years, it was felt that there was currently no indication for intervention.  EKG on 03/15/14 showed SB with first degree AVB.  Echo on 03/04/12 showed: - Left ventricle: The cavity size was normal. Wall thickness was increased in a pattern of mild LVH. Systolic function was normal. The estimated ejection fraction was in the range of 55% to 60%. - Aortic valve: Mild regurgitation. - Mitral valve: Mild regurgitation. - Atrial septum: No defect or patent foramen ovale was identified. - Pulmonic valve: Trivial regurgitation. - Tricuspid valve: Mild regurgitation.  21 day event monitor in 02/2012 showed SR/SB with occasional PVCs. No significant arrhythmias.  Cardiac cath on 02/26/10 showed: Normal left ventricular function. Mild dilation of aortic root. Mild coronary atherosclerosis with 30% proximal RCA and somewhat diffuse irregularities in the left anterior descending without significant focal disease.  Preoperative CXR and labs noted.  His ABG was done on 03/10/14.    Anticipate that he can proceed as planned.   George Hugh [facility] Short Stay Center/Anesthesiology Phone [phone] 03/16/2014 9:47 AM

## 2014-03-17 ENCOUNTER — Inpatient Hospital Stay (HOSPITAL_COMMUNITY): Payer: Medicare HMO | Admitting: Certified Registered"

## 2014-03-17 ENCOUNTER — Encounter (HOSPITAL_COMMUNITY): Payer: Medicare HMO | Admitting: Vascular Surgery

## 2014-03-17 ENCOUNTER — Inpatient Hospital Stay (HOSPITAL_COMMUNITY): Payer: Medicare HMO

## 2014-03-17 ENCOUNTER — Encounter (HOSPITAL_COMMUNITY): Payer: Self-pay | Admitting: Anesthesiology

## 2014-03-17 ENCOUNTER — Encounter (HOSPITAL_COMMUNITY)
Admission: RE | Disposition: A | Discharge status: Home / Self Care | Payer: Self-pay | Source: Ambulatory Visit | Attending: Thoracic Surgery (Cardiothoracic Vascular Surgery)

## 2014-03-17 ENCOUNTER — Inpatient Hospital Stay (HOSPITAL_COMMUNITY)
Admission: RE | Admit: 2014-03-17 | Discharge: 2014-03-20 | DRG: 165 | Disposition: A | Discharge status: Home / Self Care | Payer: Medicare HMO | Source: Ambulatory Visit | Attending: Thoracic Surgery (Cardiothoracic Vascular Surgery) | Admitting: Thoracic Surgery (Cardiothoracic Vascular Surgery)

## 2014-03-17 DIAGNOSIS — I10 Essential (primary) hypertension: Secondary | ICD-10-CM | POA: Diagnosis present

## 2014-03-17 DIAGNOSIS — R918 Other nonspecific abnormal finding of lung field: Secondary | ICD-10-CM

## 2014-03-17 DIAGNOSIS — I712 Thoracic aortic aneurysm, without rupture, unspecified: Secondary | ICD-10-CM | POA: Diagnosis present

## 2014-03-17 DIAGNOSIS — Z87891 Personal history of nicotine dependence: Secondary | ICD-10-CM

## 2014-03-17 DIAGNOSIS — Z7982 Long term (current) use of aspirin: Secondary | ICD-10-CM

## 2014-03-17 DIAGNOSIS — I77819 Aortic ectasia, unspecified site: Secondary | ICD-10-CM | POA: Diagnosis present

## 2014-03-17 DIAGNOSIS — K219 Gastro-esophageal reflux disease without esophagitis: Secondary | ICD-10-CM | POA: Diagnosis present

## 2014-03-17 DIAGNOSIS — Z79899 Other long term (current) drug therapy: Secondary | ICD-10-CM

## 2014-03-17 DIAGNOSIS — D1430 Benign neoplasm of unspecified bronchus and lung: Principal | ICD-10-CM | POA: Diagnosis present

## 2014-03-17 DIAGNOSIS — R911 Solitary pulmonary nodule: Secondary | ICD-10-CM

## 2014-03-17 HISTORY — PX: VIDEO ASSISTED THORACOSCOPY (VATS)/WEDGE RESECTION: SHX6174

## 2014-03-17 LAB — PULMONARY FUNCTION TEST
DL/VA % pred: 81 %
DL/VA: 3.72 ml/min/mmHg/L
DLCO COR: 25.82 ml/min/mmHg
DLCO UNC % PRED: 84 %
DLCO UNC: 26.24 ml/min/mmHg
DLCO cor % pred: 83 %
FEF 25-75 PRE: 1.52 L/s
FEF 25-75 Post: 2.2 L/sec
FEF2575-%Change-Post: 44 %
FEF2575-%Pred-Post: 86 %
FEF2575-%Pred-Pre: 60 %
FEV1-%CHANGE-POST: 7 %
FEV1-%Pred-Post: 98 %
FEV1-%Pred-Pre: 91 %
FEV1-PRE: 2.98 L
FEV1-Post: 3.2 L
FEV1FVC-%Change-Post: 6 %
FEV1FVC-%PRED-PRE: 90 %
FEV6-%Change-Post: 1 %
FEV6-%Pred-Post: 107 %
FEV6-%Pred-Pre: 105 %
FEV6-POST: 4.44 L
FEV6-PRE: 4.35 L
FEV6FVC-%CHANGE-POST: 0 %
FEV6FVC-%PRED-PRE: 103 %
FEV6FVC-%Pred-Post: 104 %
FVC-%Change-Post: 1 %
FVC-%Pred-Post: 102 %
FVC-%Pred-Pre: 101 %
FVC-Post: 4.49 L
FVC-Pre: 4.44 L
POST FEV6/FVC RATIO: 99 %
Post FEV1/FVC ratio: 71 %
Pre FEV1/FVC ratio: 67 %
Pre FEV6/FVC Ratio: 98 %
RV % pred: 123 %
RV: 2.88 L
TLC % PRED: 108 %
TLC: 7.38 L

## 2014-03-17 SURGERY — VIDEO ASSISTED THORACOSCOPY (VATS)/WEDGE RESECTION
Anesthesia: General | Site: Chest | Laterality: Right

## 2014-03-17 MED ORDER — EPHEDRINE SULFATE 50 MG/ML IJ SOLN
INTRAMUSCULAR | Status: AC
  Filled 2014-03-17: qty 1

## 2014-03-17 MED ORDER — ALBUTEROL SULFATE (2.5 MG/3ML) 0.083% IN NEBU: 2.5000 mg | INHALATION_SOLUTION | RESPIRATORY_TRACT | Status: DC

## 2014-03-17 MED ORDER — ASPIRIN EC 81 MG PO TBEC
81.0000 mg | DELAYED_RELEASE_TABLET | Freq: Every day | ORAL | Status: DC
  Administered 2014-03-18 – 2014-03-19 (×2): 81 mg via ORAL
  Filled 2014-03-17 (×3): qty 1

## 2014-03-17 MED ORDER — ROCURONIUM BROMIDE 50 MG/5ML IV SOLN
INTRAVENOUS | Status: AC
  Filled 2014-03-17: qty 1

## 2014-03-17 MED ORDER — LIDOCAINE HCL (CARDIAC) 20 MG/ML IV SOLN
INTRAVENOUS | Status: AC
  Filled 2014-03-17: qty 5

## 2014-03-17 MED ORDER — PHENYLEPHRINE 40 MCG/ML (10ML) SYRINGE FOR IV PUSH (FOR BLOOD PRESSURE SUPPORT)
PREFILLED_SYRINGE | INTRAVENOUS | Status: AC
  Filled 2014-03-17: qty 10

## 2014-03-17 MED ORDER — FENTANYL 10 MCG/ML IV SOLN
INTRAVENOUS | Status: DC
  Administered 2014-03-17: 11:00:00 via INTRAVENOUS
  Administered 2014-03-17: 340 ug via INTRAVENOUS
  Administered 2014-03-17: 306.4 ug via INTRAVENOUS
  Administered 2014-03-18: 190 ug via INTRAVENOUS
  Administered 2014-03-18: 10 ug via INTRAVENOUS
  Administered 2014-03-19: 30 ug via INTRAVENOUS
  Administered 2014-03-19: 50 ug via INTRAVENOUS
  Administered 2014-03-19: 80 ug via INTRAVENOUS
  Administered 2014-03-19: 03:00:00 via INTRAVENOUS
  Administered 2014-03-19: 80 ug via INTRAVENOUS
  Filled 2014-03-17 (×4): qty 50

## 2014-03-17 MED ORDER — ASPIRIN 81 MG PO TABS: 81.0000 mg | ORAL_TABLET | Freq: Every day | ORAL | Status: DC

## 2014-03-17 MED ORDER — PNEUMOCOCCAL VAC POLYVALENT 25 MCG/0.5ML IJ INJ: 0.5000 mL | INJECTION | INTRAMUSCULAR | Status: DC

## 2014-03-17 MED ORDER — SODIUM CHLORIDE 0.9 % IJ SOLN
INTRAMUSCULAR | Status: AC
  Filled 2014-03-17: qty 10

## 2014-03-17 MED ORDER — LISINOPRIL-HYDROCHLOROTHIAZIDE 20-12.5 MG PO TABS: 2.0000 | ORAL_TABLET | Freq: Every day | ORAL | Status: DC

## 2014-03-17 MED ORDER — 0.9 % SODIUM CHLORIDE (POUR BTL) OPTIME
TOPICAL | Status: DC | PRN
  Administered 2014-03-17 (×2): 1000 mL

## 2014-03-17 MED ORDER — OXYCODONE HCL 5 MG PO TABS
ORAL_TABLET | ORAL | Status: AC
  Administered 2014-03-17: 5 mg via ORAL
  Filled 2014-03-17: qty 1

## 2014-03-17 MED ORDER — PROPOFOL 10 MG/ML IV BOLUS
INTRAVENOUS | Status: AC
  Filled 2014-03-17: qty 20

## 2014-03-17 MED ORDER — OXYCODONE HCL 5 MG PO TABS
5.0000 mg | ORAL_TABLET | Freq: Once | ORAL | Status: AC | PRN
  Administered 2014-03-17: 5 mg via ORAL

## 2014-03-17 MED ORDER — DIPHENHYDRAMINE HCL 50 MG/ML IJ SOLN: 12.5000 mg | Freq: Four times a day (QID) | INTRAMUSCULAR | Status: DC | PRN

## 2014-03-17 MED ORDER — FENTANYL CITRATE 0.05 MG/ML IJ SOLN
INTRAMUSCULAR | Status: AC
  Administered 2014-03-17: 50 ug via INTRAVENOUS
  Filled 2014-03-17: qty 2

## 2014-03-17 MED ORDER — ALBUTEROL SULFATE (2.5 MG/3ML) 0.083% IN NEBU
2.5000 mg | INHALATION_SOLUTION | RESPIRATORY_TRACT | Status: DC
  Administered 2014-03-17 (×2): 2.5 mg via RESPIRATORY_TRACT
  Filled 2014-03-17 (×2): qty 3

## 2014-03-17 MED ORDER — EPHEDRINE SULFATE 50 MG/ML IJ SOLN
INTRAMUSCULAR | Status: DC | PRN
  Administered 2014-03-17: 10 mg via INTRAVENOUS
  Administered 2014-03-17: 5 mg via INTRAVENOUS

## 2014-03-17 MED ORDER — GLYCOPYRROLATE 0.2 MG/ML IJ SOLN
INTRAMUSCULAR | Status: DC | PRN
Start: 2014-03-17 — End: 2014-03-17
  Administered 2014-03-17: .7 mg via INTRAVENOUS

## 2014-03-17 MED ORDER — LISINOPRIL 20 MG PO TABS
20.0000 mg | ORAL_TABLET | Freq: Every day | ORAL | Status: DC
  Administered 2014-03-17: 20 mg via ORAL
  Filled 2014-03-17 (×2): qty 1

## 2014-03-17 MED ORDER — MIDAZOLAM HCL 2 MG/2ML IJ SOLN
INTRAMUSCULAR | Status: AC
  Administered 2014-03-17: 2 mg via INTRAVENOUS
  Filled 2014-03-17: qty 2

## 2014-03-17 MED ORDER — HYDROMORPHONE HCL PF 1 MG/ML IJ SOLN
INTRAMUSCULAR | Status: AC
  Administered 2014-03-17: 0.5 mg via INTRAVENOUS
  Filled 2014-03-17: qty 1

## 2014-03-17 MED ORDER — BIOTENE DRY MOUTH MT LIQD
15.0000 mL | Freq: Two times a day (BID) | OROMUCOSAL | Status: DC
  Administered 2014-03-18 – 2014-03-19 (×3): 15 mL via OROMUCOSAL

## 2014-03-17 MED ORDER — LIDOCAINE HCL (CARDIAC) 20 MG/ML IV SOLN
INTRAVENOUS | Status: DC | PRN
Start: 2014-03-17 — End: 2014-03-17
  Administered 2014-03-17: 50 mg via INTRAVENOUS

## 2014-03-17 MED ORDER — DEXTROSE-NACL 5-0.9 % IV SOLN
INTRAVENOUS | Status: DC
  Administered 2014-03-17 – 2014-03-19 (×3): via INTRAVENOUS

## 2014-03-17 MED ORDER — DEXTROSE 5 % IV SOLN
1.5000 g | Freq: Two times a day (BID) | INTRAVENOUS | Status: AC
  Administered 2014-03-17 – 2014-03-18 (×2): 1.5 g via INTRAVENOUS
  Filled 2014-03-17 (×2): qty 1.5

## 2014-03-17 MED ORDER — PROPOFOL 10 MG/ML IV BOLUS
INTRAVENOUS | Status: DC | PRN
  Administered 2014-03-17: 150 mg via INTRAVENOUS

## 2014-03-17 MED ORDER — LACTATED RINGERS IV SOLN
INTRAVENOUS | Status: DC | PRN
  Administered 2014-03-17 (×2): via INTRAVENOUS

## 2014-03-17 MED ORDER — DIPHENHYDRAMINE HCL 12.5 MG/5ML PO ELIX
12.5000 mg | ORAL_SOLUTION | Freq: Four times a day (QID) | ORAL | Status: DC | PRN
  Filled 2014-03-17: qty 5

## 2014-03-17 MED ORDER — POTASSIUM CHLORIDE 10 MEQ/50ML IV SOLN: 10.0000 meq | Freq: Every day | INTRAVENOUS | Status: DC | PRN

## 2014-03-17 MED ORDER — SODIUM CHLORIDE 0.9 % IJ SOLN: 9.0000 mL | INTRAMUSCULAR | Status: DC | PRN

## 2014-03-17 MED ORDER — ONDANSETRON HCL 4 MG/2ML IJ SOLN: 4.0000 mg | Freq: Four times a day (QID) | INTRAMUSCULAR | Status: DC | PRN

## 2014-03-17 MED ORDER — NEOSTIGMINE METHYLSULFATE 10 MG/10ML IV SOLN
INTRAVENOUS | Status: AC
  Filled 2014-03-17: qty 2

## 2014-03-17 MED ORDER — NEOSTIGMINE METHYLSULFATE 10 MG/10ML IV SOLN
INTRAVENOUS | Status: DC | PRN
  Administered 2014-03-17: 4 mg via INTRAVENOUS

## 2014-03-17 MED ORDER — FENTANYL CITRATE 0.05 MG/ML IJ SOLN
INTRAMUSCULAR | Status: AC
  Filled 2014-03-17: qty 5

## 2014-03-17 MED ORDER — ACETAMINOPHEN 160 MG/5ML PO SOLN
1000.0000 mg | Freq: Four times a day (QID) | ORAL | Status: DC
  Filled 2014-03-17 (×15): qty 40

## 2014-03-17 MED ORDER — GLYCOPYRROLATE 0.2 MG/ML IJ SOLN
INTRAMUSCULAR | Status: AC
  Filled 2014-03-17: qty 4

## 2014-03-17 MED ORDER — MIDAZOLAM HCL 2 MG/2ML IJ SOLN
INTRAMUSCULAR | Status: AC
  Filled 2014-03-17: qty 2

## 2014-03-17 MED ORDER — HYDROMORPHONE HCL PF 1 MG/ML IJ SOLN
0.2500 mg | INTRAMUSCULAR | Status: DC | PRN
  Administered 2014-03-17 (×4): 0.5 mg via INTRAVENOUS

## 2014-03-17 MED ORDER — ROCURONIUM BROMIDE 100 MG/10ML IV SOLN
INTRAVENOUS | Status: DC | PRN
  Administered 2014-03-17: 50 mg via INTRAVENOUS
  Administered 2014-03-17: 20 mg via INTRAVENOUS

## 2014-03-17 MED ORDER — ONDANSETRON HCL 4 MG/2ML IJ SOLN
4.0000 mg | Freq: Four times a day (QID) | INTRAMUSCULAR | Status: DC | PRN
  Administered 2014-03-17: 4 mg via INTRAVENOUS
  Filled 2014-03-17: qty 2

## 2014-03-17 MED ORDER — ROCURONIUM BROMIDE 50 MG/5ML IV SOLN
INTRAVENOUS | Status: AC
Start: 2014-03-17 — End: 2014-03-17
  Filled 2014-03-17: qty 1

## 2014-03-17 MED ORDER — OXYCODONE HCL 5 MG/5ML PO SOLN: 5.0000 mg | Freq: Once | ORAL | Status: AC | PRN

## 2014-03-17 MED ORDER — SENNOSIDES-DOCUSATE SODIUM 8.6-50 MG PO TABS
1.0000 | ORAL_TABLET | Freq: Every day | ORAL | Status: DC
  Administered 2014-03-18 – 2014-03-19 (×3): 1 via ORAL
  Filled 2014-03-17 (×4): qty 1

## 2014-03-17 MED ORDER — TRAMADOL HCL 50 MG PO TABS
50.0000 mg | ORAL_TABLET | Freq: Four times a day (QID) | ORAL | Status: DC | PRN
  Administered 2014-03-17 – 2014-03-18 (×3): 100 mg via ORAL
  Administered 2014-03-19: 50 mg via ORAL
  Filled 2014-03-17: qty 1
  Filled 2014-03-17 (×3): qty 2

## 2014-03-17 MED ORDER — WHITE PETROLATUM GEL
Status: AC
  Administered 2014-03-18: 0.2
  Filled 2014-03-17: qty 5

## 2014-03-17 MED ORDER — MIDAZOLAM HCL 5 MG/5ML IJ SOLN
INTRAMUSCULAR | Status: DC | PRN
  Administered 2014-03-17: 2 mg via INTRAVENOUS

## 2014-03-17 MED ORDER — BISACODYL 5 MG PO TBEC
10.0000 mg | DELAYED_RELEASE_TABLET | Freq: Every day | ORAL | Status: DC
  Administered 2014-03-17 – 2014-03-19 (×3): 10 mg via ORAL
  Filled 2014-03-17 (×3): qty 2

## 2014-03-17 MED ORDER — HYDROCHLOROTHIAZIDE 12.5 MG PO CAPS
12.5000 mg | ORAL_CAPSULE | Freq: Every day | ORAL | Status: DC
  Administered 2014-03-17: 12.5 mg via ORAL
  Filled 2014-03-17 (×2): qty 1

## 2014-03-17 MED ORDER — ONDANSETRON HCL 4 MG/2ML IJ SOLN
INTRAMUSCULAR | Status: DC | PRN
  Administered 2014-03-17: 4 mg via INTRAVENOUS

## 2014-03-17 MED ORDER — ACETAMINOPHEN 500 MG PO TABS
1000.0000 mg | ORAL_TABLET | Freq: Four times a day (QID) | ORAL | Status: DC
  Administered 2014-03-17 – 2014-03-20 (×11): 1000 mg via ORAL
  Filled 2014-03-17 (×15): qty 2

## 2014-03-17 MED ORDER — NALOXONE HCL 0.4 MG/ML IJ SOLN: 0.4000 mg | INTRAMUSCULAR | Status: DC | PRN

## 2014-03-17 MED ORDER — FENTANYL CITRATE 0.05 MG/ML IJ SOLN
INTRAMUSCULAR | Status: DC | PRN
  Administered 2014-03-17: 50 ug via INTRAVENOUS
  Administered 2014-03-17 (×2): 100 ug via INTRAVENOUS

## 2014-03-17 SURGICAL SUPPLY — 85 items
ADH SKN CLS APL DERMABOND .7 (GAUZE/BANDAGES/DRESSINGS)
ADH SKN CLS LQ APL DERMABOND (GAUZE/BANDAGES/DRESSINGS) ×2
APL SKNCLS STERI-STRIP NONHPOA (GAUZE/BANDAGES/DRESSINGS) ×2
BAG SPEC RTRVL LRG 6X4 10 (ENDOMECHANICALS) ×2
BENZOIN TINCTURE PRP APPL 2/3 (GAUZE/BANDAGES/DRESSINGS) ×3 IMPLANT
CANISTER SUCTION 2500CC (MISCELLANEOUS) ×6 IMPLANT
CATH KIT ON Q 5IN SLV (PAIN MANAGEMENT) IMPLANT
CATH THORACIC 28FR (CATHETERS) IMPLANT
CATH THORACIC 36FR (CATHETERS) IMPLANT
CATH THORACIC 36FR RT ANG (CATHETERS) IMPLANT
CLIP TI MEDIUM 6 (CLIP) ×3 IMPLANT
CONT SPEC 4OZ CLIKSEAL STRL BL (MISCELLANEOUS) ×6 IMPLANT
COVER SURGICAL LIGHT HANDLE (MISCELLANEOUS) ×3 IMPLANT
DERMABOND ADHESIVE PROPEN (GAUZE/BANDAGES/DRESSINGS) ×1
DERMABOND ADVANCED (GAUZE/BANDAGES/DRESSINGS)
DERMABOND ADVANCED .7 DNX12 (GAUZE/BANDAGES/DRESSINGS) IMPLANT
DERMABOND ADVANCED .7 DNX6 (GAUZE/BANDAGES/DRESSINGS) ×1 IMPLANT
DRAIN CHANNEL 28F RND 3/8 FF (WOUND CARE) IMPLANT
DRAIN CHANNEL 32F RND 10.7 FF (WOUND CARE) IMPLANT
DRAPE LAPAROSCOPIC ABDOMINAL (DRAPES) ×3 IMPLANT
DRAPE WARM FLUID 44X44 (DRAPE) ×3 IMPLANT
ELECT REM PT RETURN 9FT ADLT (ELECTROSURGICAL) ×3
ELECTRODE REM PT RTRN 9FT ADLT (ELECTROSURGICAL) ×2 IMPLANT
GLOVE BIO SURGEON STRL SZ 6.5 (GLOVE) ×2 IMPLANT
GLOVE BIO SURGEON STRL SZ7 (GLOVE) ×2 IMPLANT
GLOVE BIO SURGEON STRL SZ7.5 (GLOVE) ×2 IMPLANT
GLOVE BIOGEL PI IND STRL 7.0 (GLOVE) ×2 IMPLANT
GLOVE BIOGEL PI INDICATOR 7.0 (GLOVE) ×2
GLOVE SURG SIGNA 7.5 PF LTX (GLOVE) ×6 IMPLANT
GOWN STRL REUS W/ TWL LRG LVL3 (GOWN DISPOSABLE) ×4 IMPLANT
GOWN STRL REUS W/ TWL XL LVL3 (GOWN DISPOSABLE) ×4 IMPLANT
GOWN STRL REUS W/TWL LRG LVL3 (GOWN DISPOSABLE) ×6
GOWN STRL REUS W/TWL XL LVL3 (GOWN DISPOSABLE) ×6
HANDLE STAPLE ENDO GIA SHORT (STAPLE) ×1
HEMOSTAT SURGICEL 2X14 (HEMOSTASIS) IMPLANT
KIT BASIN OR (CUSTOM PROCEDURE TRAY) ×3 IMPLANT
KIT ROOM TURNOVER OR (KITS) ×3 IMPLANT
KIT SUCTION CATH 14FR (SUCTIONS) ×3 IMPLANT
NS IRRIG 1000ML POUR BTL (IV SOLUTION) ×6 IMPLANT
PACK CHEST (CUSTOM PROCEDURE TRAY) ×3 IMPLANT
PAD ARMBOARD 7.5X6 YLW CONV (MISCELLANEOUS) ×6 IMPLANT
POUCH ENDO CATCH II 15MM (MISCELLANEOUS) IMPLANT
POUCH SPECIMEN RETRIEVAL 10MM (ENDOMECHANICALS) ×2 IMPLANT
RELOAD EGIA 45 MED/THCK PURPLE (STAPLE) ×6 IMPLANT
RELOAD EGIA 60 MED/THCK PURPLE (STAPLE) ×6 IMPLANT
RELOAD STAPLE 60 MED/THCK ART (STAPLE) IMPLANT
SEALANT PROGEL (MISCELLANEOUS) IMPLANT
SEALANT SURG COSEAL 4ML (VASCULAR PRODUCTS) IMPLANT
SEALANT SURG COSEAL 8ML (VASCULAR PRODUCTS) IMPLANT
SOLUTION ANTI FOG 6CC (MISCELLANEOUS) ×3 IMPLANT
SPECIMEN JAR MEDIUM (MISCELLANEOUS) ×3 IMPLANT
SPONGE GAUZE 4X4 12PLY (GAUZE/BANDAGES/DRESSINGS) ×3 IMPLANT
SPONGE GAUZE 4X4 12PLY STER LF (GAUZE/BANDAGES/DRESSINGS) ×2 IMPLANT
STAPLER ENDO GIA 12 SHRT THIN (STAPLE) IMPLANT
STAPLER ENDO GIA 12MM SHORT (STAPLE) ×2 IMPLANT
SUT PROLENE 4 0 RB 1 (SUTURE)
SUT PROLENE 4-0 RB1 .5 CRCL 36 (SUTURE) IMPLANT
SUT SILK  1 MH (SUTURE) ×1
SUT SILK 1 MH (SUTURE) ×2 IMPLANT
SUT SILK 2 0SH CR/8 30 (SUTURE) IMPLANT
SUT SILK 3 0SH CR/8 30 (SUTURE) IMPLANT
SUT VIC AB 0 CTX 27 (SUTURE) ×2 IMPLANT
SUT VIC AB 1 CTX 27 (SUTURE) ×2 IMPLANT
SUT VIC AB 2-0 CT1 27 (SUTURE)
SUT VIC AB 2-0 CT1 TAPERPNT 27 (SUTURE) IMPLANT
SUT VIC AB 2-0 CTX 36 (SUTURE) ×2 IMPLANT
SUT VIC AB 3-0 MH 27 (SUTURE) IMPLANT
SUT VIC AB 3-0 SH 27 (SUTURE)
SUT VIC AB 3-0 SH 27X BRD (SUTURE) IMPLANT
SUT VIC AB 3-0 X1 27 (SUTURE) ×6 IMPLANT
SUT VICRYL 0 UR6 27IN ABS (SUTURE) ×6 IMPLANT
SUT VICRYL 2 TP 1 (SUTURE) IMPLANT
SWAB COLLECTION DEVICE MRSA (MISCELLANEOUS) IMPLANT
SYSTEM SAHARA CHEST DRAIN ATS (WOUND CARE) ×3 IMPLANT
TAPE CLOTH SURG 4X10 WHT LF (GAUZE/BANDAGES/DRESSINGS) ×2 IMPLANT
TIP APPLICATOR SPRAY EXTEND 16 (VASCULAR PRODUCTS) IMPLANT
TOWEL OR 17X24 6PK STRL BLUE (TOWEL DISPOSABLE) ×3 IMPLANT
TOWEL OR 17X26 10 PK STRL BLUE (TOWEL DISPOSABLE) ×6 IMPLANT
TRAP SPECIMEN MUCOUS 40CC (MISCELLANEOUS) IMPLANT
TRAY FOLEY CATH 16FRSI W/METER (SET/KITS/TRAYS/PACK) ×3 IMPLANT
TROCAR XCEL BLADELESS 5X75MML (TROCAR) ×3 IMPLANT
TROCAR XCEL NON-BLD 5MMX100MML (ENDOMECHANICALS) IMPLANT
TUBE ANAEROBIC SPECIMEN COL (MISCELLANEOUS) IMPLANT
TUNNELER SHEATH ON-Q 11GX8 DSP (PAIN MANAGEMENT) IMPLANT
WATER STERILE IRR 1000ML POUR (IV SOLUTION) ×6 IMPLANT

## 2014-03-17 NOTE — Progress Notes (Signed)
Utilization review completed.  

## 2014-03-17 NOTE — Interval H&P Note (Signed)
History and Physical Interval Note:  03/17/2014 7:57 AM  Wayne Rodriguez  has presented today for surgery, with the diagnosis of RUL NODULES  The various methods of treatment have been discussed with the patient and family. After consideration of risks, benefits and other options for treatment, the patient has consented to  Procedure(s): VIDEO ASSISTED THORACOSCOPY (VATS)/WEDGE RESECTION (Right) LOBECTOMY-POSSIBLE RUL (Right) as a surgical intervention .  The patient's history has been reviewed, patient examined, no change in status, stable for surgery.  I have reviewed the patient's chart and labs.  Questions were answered to the patient's satisfaction.     Melrose Nakayama

## 2014-03-17 NOTE — Transfer of Care (Signed)
Immediate Anesthesia Transfer of Care Note  Patient: Wayne Rodriguez  Procedure(s) Performed: Procedure(s): VIDEO ASSISTED THORACOSCOPY (VATS)/WEDGE RESECTION (Right)  Patient Location: PACU  Anesthesia Type:General  Level of Consciousness: awake, alert  and oriented  Airway & Oxygen Therapy: Patient Spontanous Breathing and Patient connected to nasal cannula oxygen  Post-op Assessment: Report given to PACU RN and Post -op Vital signs reviewed and stable  Post vital signs: Reviewed and stable  Complications: No apparent anesthesia complications

## 2014-03-17 NOTE — Brief Op Note (Addendum)
      [address]       [place] [address]             [phone]     03/17/2014  9:45 AM  PATIENT:  Wayne Rodriguez  68 y.o. male  PRE-OPERATIVE DIAGNOSIS:  RUL NODULES  POST-OPERATIVE DIAGNOSIS:  right upper lobe nodules  PROCEDURE:  Procedure(s): RIGHT VIDEO ASSISTED THORACOSCOPY (VATS) WEDGE RESECTION RIGHT UPPER LOBE  SURGEON:  Surgeon(s): Melrose Nakayama, MD  PHYSICIAN ASSISTANT: WAYNE GOLD PA-C  ANESTHESIA:   general  PATIENT CONDITION:  PACU - hemodynamically stable.  PRE-OPERATIVE WEIGHT: 03.5KK  COMPLICATIONS: NO KNOWN  DRAINS: 4F right chest tube  FROZEN: INFLAMMATORY LESIONS, no malignancy seen

## 2014-03-17 NOTE — H&P (View-Only) (Signed)
HPI:  Wayne Rodriguez returns today to discuss results of his PET/CT.  Wayne Rodriguez is a 68 year old gentleman with a 30-pack-year history of smoking. He was found in 2011 to have a 4.1 cm ascending aortic aneurysm. He recently had a repeat CT to check that. The ascending aneurysm was unchanged. However, there were 2 new right upper lobe nodules. He has a chronic I. calcified granuloma in the posterior aspect of the right upper lobe. In the anterior apical portion of the right upper lobe there was a 1 cm mass in association with some scarring, there was a separate 5 mm mass just superior to that. I recommended that we do a PET/CT to further evaluate.  Past Medical History  Diagnosis Date  . Chest pain     a. 02/2010 Cath: normal  . HTN (hypertension)   . GERD (gastroesophageal reflux disease)   . Back pain   . Aortic root dilatation     a. 09/2010 CT: 4.1cm Max Asc Ao diam, 3.7cm Max Desc Ao diam;; s/p MRI/MRA in April 2013  . Decreased exercise tolerance     a. 02/2012  . Palpitations     a. h/o palps r/t caffeine - has occurred again 02/2012  . Pulmonary nodule       Current Outpatient Prescriptions  Medication Sig Dispense Refill  . aspirin 81 MG tablet Take 81 mg by mouth daily.        . Cholecalciferol (VITAMIN D-3 PO) Take by mouth daily.      Marland Kitchen Cod Liver Oil 1000 MG CAPS Take by mouth daily.      . Cyanocobalamin (VITAMIN B-12 PO) Take 1 tablet by mouth daily.       . Flaxseed, Linseed, (FLAX SEED OIL PO) Take by mouth daily.      Marland Kitchen lisinopril-hydrochlorothiazide (PRINZIDE,ZESTORETIC) 20-12.5 MG per tablet Take 2 tablets by mouth daily.  180 tablet  0  . nitroGLYCERIN (NITROSTAT) 0.4 MG SL tablet Place 1 tablet (0.4 mg total) under the tongue every 5 (five) minutes as needed.  25 tablet  11  . Probiotic Product (PROBIOTIC DAILY PO) Take by mouth daily.       No current facility-administered medications for this visit.    Physical Exam BP 128/71  Pulse 64  Resp 20  Ht 5\' 9"   (1.753 m)  Wt 152 lb (68.947 kg)  BMI 22.44 kg/m2  SpO6 70% 68 year old male in no acute distress Well-developed and well-nourished HEENT unremarkable Neck no cervical or subclavicular adenopathy Cardiac regular rate and rhythm normal S1-S2 Lungs clear with equal breath sounds bilaterally  Diagnostic Tests: NUCLEAR MEDICINE PET SKULL BASE TO THIGH  TECHNIQUE:  7.9 mCi F-18 FDG was injected intravenously. Full-ring PET imaging  was performed from the skull base to thigh after the radiotracer. CT  data was obtained and used for attenuation correction and anatomic  localization.  FASTING BLOOD GLUCOSE: Value: 93 mg/dl  COMPARISON: CT 02/21/2014  FINDINGS:  NECK  No hypermetabolic lymph nodes in the neck.  CHEST  The small right upper lobe pulmonary nodule of concern measuring 10  mm x 8 mm (image 63, series 4) does have mild associated metabolic  activity ( SUV max = 1 .5). There is trace metabolic activity  associated with the more superior 5 mm nodule (images 57).  There is a hypermetabolic right paratracheal lymph node which has  speckled calcifications measuring 7 mm (image 64 series 4) with SUV  max = 3.9  ABDOMEN/PELVIS  No abnormal  hypermetabolic activity within the liver, pancreas,  adrenal glands, or spleen. No hypermetabolic lymph nodes in the  abdomen or pelvis. Scattered granulomata within the spleen.  SKELETON  No focal hypermetabolic activity to suggest skeletal metastasis.  IMPRESSION:  1. Very mild hypermetabolic activity associated with the right upper  lobe pulmonary nodule is concerning for bronchogenic carcinoma but  not specific due to small size.  2. Even fainter activity associated with the more superior right  pulmonary nodule.  3. Mild activity associated with the a partially calcified  paratracheal lymph node. Burtis Junes this to represent a reactive node of  granulomatous disease.  Electronically Signed  By: Suzy Bouchard M.D.  On: 03/07/2014  15:37   Impression: 68 year old man with a 30-pack-year history of smoking who has a new right upper lobe nodule measuring 8 x 10 mm that is mildly hypermetabolic by PET CT. This has to be considered a lung cancer unless it be proven otherwise. I don't think he can be definitively proven otherwise with a CT-guided biopsy or bronchoscopic biopsy. Both would be prone to a high incidence of false negative results.  My recommendation was that we proceed with a right video-assisted thoracoscopy and wedge resection incorporating both the new nodules. If intraoperative frozen section shows cancer we would proceed with a thoracoscopic right upper lobectomy for definitive treatment.  I discussed the general nature of the procedure with Wayne Rodriguez and his family. They understand the need for general anesthesia, the incisions to be used, and the expected hospital stay. We discussed the indications, risks, benefits, and alternatives. They understand the risks include but are not limited to death, MI, DVT, PE, bleeding, possible need for transfusion, infection, prolonged air leak, cardiac arrhythmias, as well as the possibility of unforeseeable complications.  He does wish to have the lesions resected.  Plan: Right VATS, wedge resection, possible right upper lobectomy on Thursday, 03/17/2014

## 2014-03-17 NOTE — Anesthesia Procedure Notes (Signed)
Procedure Name: Intubation Date/Time: 03/17/2014 8:11 AM Performed by: Mariea Clonts Pre-anesthesia Checklist: Patient identified, Patient being monitored, Timeout performed, Emergency Drugs available and Suction available Patient Re-evaluated:Patient Re-evaluated prior to inductionOxygen Delivery Method: Circle system utilized Preoxygenation: Pre-oxygenation with 100% oxygen Intubation Type: IV induction Ventilation: Mask ventilation without difficulty Grade View: Grade I Tube type: Oral Endobronchial tube: Double lumen EBT, Left, EBT position confirmed by fiberoptic bronchoscope and EBT position confirmed by auscultation and 37 Fr Number of attempts: 1 Placement Confirmation: ETT inserted through vocal cords under direct vision,  breath sounds checked- equal and bilateral and positive ETCO2 Tube secured with: Tape Dental Injury: Teeth and Oropharynx as per pre-operative assessment

## 2014-03-17 NOTE — Anesthesia Preprocedure Evaluation (Signed)
Anesthesia Evaluation  Patient identified by MRN, date of birth, ID band Patient awake    Reviewed: Allergy & Precautions, H&P , NPO status , Patient's Chart, lab work & pertinent test results  Airway Mallampati: II  Neck ROM: full    Dental   Pulmonary former smoker,  Lung nodule         Cardiovascular hypertension, + Peripheral Vascular Disease  02/2010 Cath: normal   Neuro/Psych    GI/Hepatic GERD-  ,  Endo/Other    Renal/GU      Musculoskeletal   Abdominal   Peds  Hematology   Anesthesia Other Findings   Reproductive/Obstetrics                           Anesthesia Physical Anesthesia Plan  ASA: III  Anesthesia Plan: General   Post-op Pain Management:    Induction: Intravenous  Airway Management Planned: Double Lumen EBT  Additional Equipment: Arterial line and CVP  Intra-op Plan:   Post-operative Plan: Extubation in OR  Informed Consent: I have reviewed the patients History and Physical, chart, labs and discussed the procedure including the risks, benefits and alternatives for the proposed anesthesia with the patient or authorized representative who has indicated his/her understanding and acceptance.     Plan Discussed with: CRNA, Anesthesiologist and Surgeon  Anesthesia Plan Comments:         Anesthesia Quick Evaluation

## 2014-03-18 ENCOUNTER — Inpatient Hospital Stay (HOSPITAL_COMMUNITY): Payer: Medicare HMO

## 2014-03-18 ENCOUNTER — Encounter (HOSPITAL_COMMUNITY): Payer: Self-pay | Admitting: Thoracic Surgery (Cardiothoracic Vascular Surgery)

## 2014-03-18 LAB — BLOOD GAS, ARTERIAL
Acid-Base Excess: 4.4 mmol/L — ABNORMAL HIGH (ref 0.0–2.0)
BICARBONATE: 28.8 meq/L — AB (ref 20.0–24.0)
FIO2: 0.21 %
O2 SAT: 96.5 %
PCO2 ART: 46.2 mmHg — AB (ref 35.0–45.0)
PH ART: 7.411 (ref 7.350–7.450)
PO2 ART: 82 mmHg (ref 80.0–100.0)
Patient temperature: 98.6
TCO2: 30.2 mmol/L (ref 0–100)

## 2014-03-18 LAB — BASIC METABOLIC PANEL
BUN: 9 mg/dL (ref 6–23)
CHLORIDE: 97 meq/L (ref 96–112)
CO2: 27 mEq/L (ref 19–32)
Calcium: 8.2 mg/dL — ABNORMAL LOW (ref 8.4–10.5)
Creatinine, Ser: 0.85 mg/dL (ref 0.50–1.35)
GFR, EST NON AFRICAN AMERICAN: 88 mL/min — AB (ref >90)
Glucose, Bld: 119 mg/dL — ABNORMAL HIGH (ref 70–99)
POTASSIUM: 3.5 meq/L — AB (ref 3.7–5.3)
Sodium: 133 mEq/L — ABNORMAL LOW (ref 137–147)

## 2014-03-18 LAB — CBC
HEMATOCRIT: 35.5 % — AB (ref 39.0–52.0)
HEMOGLOBIN: 12 g/dL — AB (ref 13.0–17.0)
MCH: 29 pg (ref 26.0–34.0)
MCHC: 33.8 g/dL (ref 30.0–36.0)
MCV: 85.7 fL (ref 78.0–100.0)
Platelets: 123 10*3/uL — ABNORMAL LOW (ref 150–400)
RBC: 4.14 MIL/uL — ABNORMAL LOW (ref 4.22–5.81)
RDW: 12.9 % (ref 11.5–15.5)
WBC: 6.3 10*3/uL (ref 4.0–10.5)

## 2014-03-18 MED ORDER — ENOXAPARIN SODIUM 40 MG/0.4ML ~~LOC~~ SOLN
40.0000 mg | SUBCUTANEOUS | Status: DC
  Administered 2014-03-18 – 2014-03-19 (×2): 40 mg via SUBCUTANEOUS
  Filled 2014-03-18 (×3): qty 0.4

## 2014-03-18 MED ORDER — LISINOPRIL 40 MG PO TABS
40.0000 mg | ORAL_TABLET | Freq: Every day | ORAL | Status: DC
  Administered 2014-03-19: 40 mg via ORAL
  Filled 2014-03-18 (×3): qty 1

## 2014-03-18 MED ORDER — HYDROCHLOROTHIAZIDE 25 MG PO TABS
25.0000 mg | ORAL_TABLET | Freq: Every day | ORAL | Status: DC
  Administered 2014-03-19: 25 mg via ORAL
  Filled 2014-03-18 (×3): qty 1

## 2014-03-18 MED ORDER — ALBUTEROL SULFATE (2.5 MG/3ML) 0.083% IN NEBU
2.5000 mg | INHALATION_SOLUTION | Freq: Two times a day (BID) | RESPIRATORY_TRACT | Status: DC
  Administered 2014-03-19: 2.5 mg via RESPIRATORY_TRACT
  Filled 2014-03-18: qty 3

## 2014-03-18 NOTE — Discharge Summary (Signed)
Physician Discharge Summary       [phone]       [place] [address]             [phone]    Patient ID: Wayne Rodriguez MRN: [id number] DOB/AGE: 67-Sep-1947 67 y.o.  Admit date: 03/17/2014 Discharge date: 03/18/2014  Admission Diagnoses: 1.Right upper lobe nodules 2. History of tobacco abuse 3. History of hypertension 4. History of aortic root dilatation 5. History of GERD  Discharge Diagnoses:  1.Right upper lobe nodules 2. History of tobacco abuse 3. History of hypertension 4. History of aortic root dilatation 5. History of GERD   Procedure (s):  Right video-assisted thoracoscopy, wedge resection, right  upper lobe nodules by Dr. Roxan Hockey 03/17/2014.  Pathology:  Inflammatory nodules, no cancer seen  History of Presenting Illness: This is a 68 year old man with a remote history of tobacco abuse. He smoked about a pack a day starting at age 44 and quit in 71. He had a CT of his chest in 2011 which showed a 4.1 cm ascending aortic aneurysm. There was an incidentally noted calcified granuloma. He recently had a CT angiogram to followup his ascending aneurysm. It was unchanged, but there was a new 11 mm some solid nodule in the right upper lobe that had a spiculated appearance.  Wayne Rodriguez says that he has been feeling well. He's had a chronic non productive cough for years which he attributes to reflux. He says that when he takes medication for his reflux his cough goes away. He has not had any hemoptysis. His weight is unchanged over the past 6 months. He has not been having any chest pain, tightness or pressure. He denies shortness of breath and wheezing. Dr. Roxan Hockey discussed the need for a right VATS and biopsy of the nodule. Potential risks, benefits, and complications were discussed with the patient and he agreed to proceed with surgery. He underwent a right VATS and wedge resection on 03/17/2014.  Brief Hospital Course:  Patient remained  afebrile and hemodynamically stable. A line and foley were removed early in his post operative course. Daily chest x rays were obtained and remained stable. Chest tube had minimal output. It was placed to water seal on 03/18/2014. If chest x ray remains stable, chest tube will be removed later  on 03/18/2014. Patient is ambulating on room air. He is tolerating a diet. Provided he remains afebrile, chest x ray remains stable, and pending morning round evaluation, he will be surgically stable for discharge on 03/20/2014.    Latest Vital Signs: Blood pressure 114/74, pulse 64, temperature 97.7 F (36.5 C), temperature source Oral, resp. rate 25, height 5\' 9"  (1.753 m), weight 156 lb 15.5 oz (71.2 kg), SpO2 96.00%.  Physical Exam: General appearance: alert and no distress  Neurologic: intact  Heart: regular rate and rhythm  Lungs: clear to auscultation bilaterally  no air leak, serosanguinous drainage   Discharge Condition:Stable  Recent laboratory studies:  Lab Results  Component Value Date   WBC 6.3 03/18/2014   HGB 12.0* 03/18/2014   HCT 35.5* 03/18/2014   MCV 85.7 03/18/2014   PLT 123* 03/18/2014   Lab Results  Component Value Date   NA 133* 03/18/2014   K 3.5* 03/18/2014   CL 97 03/18/2014   CO2 27 03/18/2014   CREATININE 0.85 03/18/2014   GLUCOSE 119* 03/18/2014      Diagnostic Studies:    Nm Pet Image Initial (pi) Skull Base To Thigh  03/07/2014  CLINICAL DATA:  Initial treatment strategy for solitary pulmonary nodule.  EXAM: NUCLEAR MEDICINE PET SKULL BASE TO THIGH  TECHNIQUE: 7.9 mCi F-18 FDG was injected intravenously. Full-ring PET imaging was performed from the skull base to thigh after the radiotracer. CT data was obtained and used for attenuation correction and anatomic localization.  FASTING BLOOD GLUCOSE:  Value: 93 mg/dl  COMPARISON:  CT 02/21/2014  FINDINGS: NECK  No hypermetabolic lymph nodes in the neck.  CHEST  The small right upper lobe pulmonary nodule of concern  measuring 10 mm x 8 mm (image 63, series 4) does have mild associated metabolic activity ( SUV max = 1 .5). There is trace metabolic activity associated with the more superior 5 mm nodule (images 57).  There is a hypermetabolic right paratracheal lymph node which has speckled calcifications measuring 7 mm (image 64 series 4) with SUV max = 3.9  ABDOMEN/PELVIS  No abnormal hypermetabolic activity within the liver, pancreas, adrenal glands, or spleen. No hypermetabolic lymph nodes in the abdomen or pelvis. Scattered granulomata within the spleen.  SKELETON  No focal hypermetabolic activity to suggest skeletal metastasis.  IMPRESSION: 1. Very mild hypermetabolic activity associated with the right upper lobe pulmonary nodule is concerning for bronchogenic carcinoma but not specific due to small size. 2. Even fainter activity associated with the more superior right pulmonary nodule. 3. Mild activity associated with the a partially calcified paratracheal lymph node. Wayne Rodriguez this to represent a reactive node of granulomatous disease.   Electronically Signed   By: Suzy Bouchard M.D.   On: 03/07/2014 15:37   Dg Chest Port 1 View  03/18/2014   CLINICAL DATA:  Right chest tube.  EXAM: PORTABLE CHEST - 1 VIEW  COMPARISON:  03/17/2014  FINDINGS: The right chest tube remains near the right lung apex. Postsurgical changes are noted in the right upper lung. There appears to be increased or new subcutaneous gas in the right chest. Question a pleural line along the periphery of the right chest and near the right lung apex. Heart size is normal. The trachea is midline. Left lung is clear. Central line is stable with the tip in the SVC region.  IMPRESSION: Question a small right pneumothorax with increased subcutaneous gas in the right chest.  Stable position of the right chest tube.   Electronically Signed   By: Markus Daft M.D.   On: 03/18/2014 07:57    Ct Angio Chest Aorta W/cm &/or Wo/cm  02/21/2014   CLINICAL DATA:   Follow-up thoracic aortic dilatation  EXAM: CT ANGIOGRAPHY CHEST WITH CONTRAST  TECHNIQUE: Multidetector CT imaging of the chest was performed using the standard protocol during bolus administration of intravenous contrast. Multiplanar CT image reconstructions and MIPs were obtained to evaluate the vascular anatomy.  CONTRAST:  130mL OMNIPAQUE IOHEXOL 350 MG/ML SOLN  COMPARISON:  Prior MRA chest 02/27/2012; prior CT chest 09/24/2010  FINDINGS: Mediastinum: Unremarkable CT appearance of the thyroid gland. No suspicious mediastinal or hilar adenopathy. No soft tissue mediastinal mass. The thoracic esophagus is unremarkable. Small calcified mediastinal nodes likely reflect old granulomatous disease.  Heart/Vascular: Slightly limited evaluation secondary to motion related artifact. Ectasia of the ascending thoracic aorta is essentially unchanged and again measures 4.1 cm at the level of the right main pulmonary artery. Normal appearance of the aortic root and sino-tubular junction. Trace atherosclerotic calcifications without significant stenosis. No evidence of dissection. Persistent mild aneurysmal dilatation of the proximal descending thoracic aorta. Maximal transverse diameter Stable at 3.6 cm. More distal descending  thoracic aorta is unremarkable.  The heart is within normal limits for size. No pericardial effusion.  Lungs/Pleura: Nonspecific 5 mm sub solid ground-glass attenuation nodular opacity in the right upper lobe (image 13 series 5). More inferiorly, there is a spiculated 1.1 x 0.7 cm nodule affiliated with linear pleural parenchymal scar in the right upper lobe (image 21 series 5). Stable calcified granuloma in the medial aspect of the right upper lobe. Background of mild centrilobular emphysema.  Bones/Soft Tissues: No acute fracture or aggressive appearing lytic or blastic osseous lesion.  Upper Abdomen: Scattered punctate calcifications throughout the spleen consistent with old granulomatous disease. No  definite hepatic lesion. Visualized adrenal glands are unremarkable. No acute abnormality in the visualized upper abdomen.  Review of the MIP images confirms the above findings.  IMPRESSION: VASCULAR  1. Stable fusiform dilatation of the ascending thoracic aorta with a maximal diameter of 4.1 cm, and proximal descending thoracic aorta to 3.6 cm. 2. Mild aortic atherosclerosis. NON VASCULAR  1. Interval development of a new 1.1 x 0.7 cm spiculated pulmonary nodule in the right upper lobe associated with a band of linear pleural parenchymal scarring. Differential considerations include nodular scarring and primary bronchogenic carcinoma. Recommend further evaluation with PET-CT. 2. Nonspecific 5 mm sub solid nodule more superiorly in the right lung apex. This abnormality could also be further evaluated by PET-CT although it may be below the size threshold for effective evaluation. 3. Background of centrilobular emphysema, old granulomatous disease and chronic mild bronchial wall thickening. These results will be called to the ordering clinician or representative by the Radiologist Assistant, and communication documented in the PACS Dashboard.   Electronically Signed   By: Jacqulynn Cadet M.D.   On: 02/21/2014 17:26    Discharge Medications:    Medication List         aspirin 81 MG tablet  Take 81 mg by mouth daily.     Cod Liver Oil 1000 MG Caps  Take 1 capsule by mouth daily.     FLAX SEED OIL PO  Take 1 tablet by mouth daily.     lisinopril-hydrochlorothiazide 20-12.5 MG per tablet  Commonly known as:  PRINZIDE,ZESTORETIC  Take 2 tablets by mouth daily.     nitroGLYCERIN 0.4 MG SL tablet  Commonly known as:  NITROSTAT  Place 1 tablet (0.4 mg total) under the tongue every 5 (five) minutes as needed.     PROBIOTIC DAILY PO  Take 1 capsule by mouth daily.     traMADol 50 MG tablet  Commonly known as:  ULTRAM  Take 1-2 tablets (50-100 mg total) by mouth every 6 (six) hours as needed  (mild pain).     VITAMIN B-12 PO  Take 1 tablet by mouth daily.     VITAMIN D-3 PO  Take 1 tablet by mouth daily.       Follow Up Appointments: Follow-up Information   Follow up with Melrose Nakayama, MD On 04/05/2014. (PA/LAT CXR to be taken (at [facility] which is in the same building as Dr. Leonarda Salon office) on 04/05/2014 at 1:00 pm;Appointment time is at 2:00 pm)    Specialty:  Cardiothoracic Surgery   Contact information:   [place] [address] [phone]       Signed: Sharalyn Ink [facility] 03/18/2014, 1:44 PM

## 2014-03-18 NOTE — Op Note (Signed)
Wayne Rodriguez, Wayne Rodriguez              ACCOUNT NO.:  [id number]  MEDICAL RECORD NO.:  [id number]  LOCATION:  3S13C                        FACILITY:  [facility]  PHYSICIAN:  Revonda Standard. Roxan Hockey, M.D.DATE OF BIRTH:  1945-11-28  DATE OF PROCEDURE:  03/17/2014 DATE OF DISCHARGE:                              OPERATIVE REPORT   PREOPERATIVE DIAGNOSIS:  Right upper lobe nodules.  POSTOPERATIVE DIAGNOSIS:  Inflammatory nodules of right upper lobe.  PROCEDURE:  Right video-assisted thoracoscopy, wedge resection, right upper lobe nodules.  SURGEON:  Revonda Standard. Roxan Hockey, M.D.  ASSISTANT:  [name], P.A.-C.  ANESTHESIA:  General.  FINDINGS:  Adhesions at the apex, palpable mass in the anterior aspect of the right upper lobe.  Frozen section revealed inflammatory masses. No tumor was seen.  Calcified granuloma posteriorly.  Multiple small granulomatous like lesions, palpable and visible throughout the right upper lobe.  CLINICAL NOTE:  [name] is a 68 year old gentleman with a history of tobacco abuse.  He was being followed with CT scan for a 4.1 cm ascending aortic aneurysm.  On his most recent CT, the aneurysm was unchanged, but there were 2 new right upper lobe nodules and also was a chronic calcified granuloma posteriorly.  A PET-CT showed the new nodules to be mildly hypermetabolic.  Given the patient's risk and the radiographic findings, the patient was advised to undergo wedge resection for diagnostic purposes to be followed by lobectomy if cancer was found on frozen section.  The indications, risks, benefits, and alternatives were discussed in detail with the patient.  He understood and accepted the risks and agreed to proceed.  DESCRIPTION OF PROCEDURE:  [name] was brought to the preoperative holding area on Mar 17, 2014.  There anesthesia placed a central line and an arterial blood pressure monitoring line.  Intravenous antibiotics were administered.  He was taken  to the operating room, anesthetized, and intubated with a double-lumen endotracheal tube.  Sequential compression devices were placed on the calves for DVT prophylaxis.  A Foley catheter was placed.  He was placed in the left lateral decubitus position, and the right chest was prepped and draped in usual sterile fashion.  Single lung ventilation of the left lung was initiated and was tolerated well throughout the procedure.  An incision was made in the seventh intercostal space in the midaxillary line.  It was carried through the skin and subcutaneous tissue.  The chest was entered bluntly using a 5-mm port.  The thoracoscope was placed through the port.  There was no pleural effusion.  There were adhesions of the upper lobe to the apical chest wall.  These were thin and filmy adhesions.  A small working incision was made in approximately the fourth interspace anterolaterally.  This was 5 cm in length.  No rib spreading was performed during the procedure.  The adhesions were taken down with electrocautery.  The upper lobe was palpated.  The calcified granuloma was clearly visible posteriorly. Anteriorly, there was a palpable mass that was not directly associated with the adhesion, but was in the general vicinity of that area. It corresponded to the lesion seen on CT. A wedge resection was performed with sequential firings of an  endoscopic GIA stapler.  The specimen was placed in an endoscopic retrieval bag and removed through the utility incision.  It was sent for frozen section. While awaiting that result, the fissures were inspected and were essentially complete.  The inferior pulmonary ligament was divided, and then we awaited the results of the frozen section before doing any additional dissection.  The frozen section returned showing an inflammatory fibrotic process with no tumor seen.  The decision was made to go ahead and removed the calcified granuloma posteriorly.  This was done  with sequential firings of an endoscopic GIA stapler.  The specimen was sent for permanent Pathology only.  A final inspection was made of the staple lines for hemostasis.  The scope was removed.  A 28-French chest tube was placed through the original port incision and secured with #1 silk suture.  The right lung was reinflated.  The incision was closed with a #1 Vicryl fascial suture followed by 2-0 Vicryl subcutaneous closure and a 3-0 Vicryl subcuticular closure.  The chest tube was placed to suction.  The patient was placed back into the supine position.  He was extubated in the operating room and taken to the postanesthetic care unit in good condition.     Revonda Standard Roxan Hockey, M.D.     SCH/MEDQ  D:  03/17/2014  T:  03/18/2014  Job:  [id number]

## 2014-03-18 NOTE — Progress Notes (Signed)
1 Day Post-Op Procedure(s) (LRB): VIDEO ASSISTED THORACOSCOPY (VATS)/WEDGE RESECTION (Right) Subjective: Some incisional pain No difficulty breathing  Objective: Vital signs in last 24 hours: Temp:  [97.6 F (36.4 C)-99 F (37.2 C)] 97.7 F (36.5 C) (05/15 0723) Pulse Rate:  [40-57] 48 (05/15 0400) Cardiac Rhythm:  [-] Sinus bradycardia (05/15 0400) Resp:  [9-22] 14 (05/15 0400) BP: (102-145)/(59-72) 102/60 mmHg (05/15 0400) SpO2:  [93 %-100 %] 93 % (05/15 0400) Arterial Line BP: (123-188)/(45-70) 123/51 mmHg (05/15 0400) Weight:  [156 lb 15.5 oz (71.2 kg)] 156 lb 15.5 oz (71.2 kg) (05/14 1330)  Hemodynamic parameters for last 24 hours:    Intake/Output from previous day: 05/14 0701 - 05/15 0700 In: 3890 [P.O.:840; I.V.:3000; IV Piggyback:50] Out: 2220 [Urine:2025; Chest Tube:195] Intake/Output this shift: Total I/O In: 100 [I.V.:100] Out: 425 [Urine:425]  General appearance: alert and no distress Neurologic: intact Heart: regular rate and rhythm Lungs: clear to auscultation bilaterally no air leak, serosanguinous drainage  Lab Results:  Recent Labs  03/15/14 1324 03/18/14 0425  WBC 5.2 6.3  HGB 15.1 12.0*  HCT 44.9 35.5*  PLT 160 123*   BMET:  Recent Labs  03/15/14 1324 03/18/14 0425  NA 139 133*  K 4.2 3.5*  CL 97 97  CO2 30 27  GLUCOSE 96 119*  BUN 13 9  CREATININE 0.90 0.85  CALCIUM 9.5 8.2*    PT/INR:  Recent Labs  03/15/14 1324  LABPROT 14.1  INR 1.11   ABG    Component Value Date/Time   PHART 7.411 03/18/2014 0423   HCO3 28.8* 03/18/2014 0423   TCO2 30.2 03/18/2014 0423   O2SAT 96.5 03/18/2014 0423   CBG (last 3)  No results found for this basename: GLUCAP,  in the last 72 hours  Assessment/Plan: S/P Procedure(s) (LRB): VIDEO ASSISTED THORACOSCOPY (VATS)/WEDGE RESECTION (Right) - POD # 1 wedge resection  No air leak and minimal drainage- CT to water seal may be able to dc later today  Hypertension- restart lisinopril/  HCTZ  Decrease IV to 50 ml  Clears, advance diet as tolerated  OOB and ambulate   LOS: 1 day    Melrose Nakayama 03/18/2014

## 2014-03-18 NOTE — Progress Notes (Signed)
Additional copy of Medicare IM given to pt and signed copy placed in shadow chart 

## 2014-03-18 NOTE — Discharge Instructions (Signed)
Video-Assisted Thoracic Surgery °Care After °Refer to this sheet in the next few weeks. These instructions provide you with information on caring for yourself after your procedure. Your caregiver may also give you more specific instructions. Your procedure has been planned according to current medical practices, but problems sometimes occur. Call your caregiver if you have any problems or questions after your procedure. °HOME CARE INSTRUCTIONS  °· Only take over-the-counter or prescription medications as directed. °· Only take pain medications (narcotics) as directed. °· Do not drive until your caregiver approves. Driving while taking narcotics or soon after surgery can be dangerous, so discuss the specific timing with your caregiver. °· Avoid activities that use your chest muscles, such as lifting heavy objects, for at least 3 4 weeks.   °· Take deep breaths to expand the lungs and to protect against pneumonia. °· Do breathing exercises as directed by your caregiver. If you were given an incentive spirometer to help with breathing, use it as directed. °· You may resume a normal diet and activities when you feel you are able to or as directed. °· Do not take a bath until your caregiver says it is OK. Use the shower instead.   °· Keep the bandage (dressing) covering the area where the chest tube was inserted (incision site) dry for 48 hours. After 48 hours, remove the dressing unless there is new drainage. °· Remove dressings as directed by your caregiver. °· Change dressings if necessary or as directed. °· Keep all follow-up appointments. It is important for you to see your caregiver after surgery to discuss appropriate follow-up care and surveillance, if it is necessary. °SEEK MEDICAL CARE: °· You feel excessive or increasing pain at an incision site. °· You notice bleeding, skin irritation, drainage, swelling, or redness at an incision site. °· There is a bad smell coming from an incision or dressing. °· It feels  like your heart is fluttering or beating rapidly. °· Your pain medication does not relieve your pain. °SEEK IMMEDIATE MEDICAL CARE IF:  °· You have a fever.   °· You have chest pain.  °· You have a rash. °· You have shortness of breath. °· You have trouble breathing.   °· You feel weak, lightheaded, dizzy, or faint.   °MAKE SURE YOU:  °· Understand these instructions.   °· Will watch your condition.   °· Will get help right away if you are not doing well or get worse. °Document Released: 02/15/2013 Document Reviewed: 02/15/2013 °[employer]® Patient Information ©2014 [employer]. ° °

## 2014-03-19 ENCOUNTER — Inpatient Hospital Stay (HOSPITAL_COMMUNITY): Payer: Medicare HMO

## 2014-03-19 LAB — COMPREHENSIVE METABOLIC PANEL
ALBUMIN: 2.8 g/dL — AB (ref 3.5–5.2)
ALK PHOS: 46 U/L (ref 39–117)
ALT: 9 U/L (ref 0–53)
AST: 17 U/L (ref 0–37)
BILIRUBIN TOTAL: 0.5 mg/dL (ref 0.3–1.2)
BUN: 9 mg/dL (ref 6–23)
CO2: 27 mEq/L (ref 19–32)
Calcium: 8.7 mg/dL (ref 8.4–10.5)
Chloride: 102 mEq/L (ref 96–112)
Creatinine, Ser: 0.78 mg/dL (ref 0.50–1.35)
GFR calc Af Amer: >90 mL/min (ref >90)
GFR calc non Af Amer: >90 mL/min (ref >90)
Glucose, Bld: 120 mg/dL — ABNORMAL HIGH (ref 70–99)
POTASSIUM: 3.9 meq/L (ref 3.7–5.3)
Sodium: 140 mEq/L (ref 137–147)
Total Protein: 5.7 g/dL — ABNORMAL LOW (ref 6.0–8.3)

## 2014-03-19 LAB — CBC
HEMATOCRIT: 37.4 % — AB (ref 39.0–52.0)
Hemoglobin: 12.3 g/dL — ABNORMAL LOW (ref 13.0–17.0)
MCH: 28.5 pg (ref 26.0–34.0)
MCHC: 32.9 g/dL (ref 30.0–36.0)
MCV: 86.6 fL (ref 78.0–100.0)
Platelets: 131 10*3/uL — ABNORMAL LOW (ref 150–400)
RBC: 4.32 MIL/uL (ref 4.22–5.81)
RDW: 13 % (ref 11.5–15.5)
WBC: 6.3 10*3/uL (ref 4.0–10.5)

## 2014-03-19 MED ORDER — MAGNESIUM HYDROXIDE 400 MG/5ML PO SUSP
30.0000 mL | Freq: Every day | ORAL | Status: DC | PRN
  Administered 2014-03-19: 30 mL via ORAL
  Filled 2014-03-19: qty 30

## 2014-03-19 MED ORDER — ALBUTEROL SULFATE (2.5 MG/3ML) 0.083% IN NEBU: 2.5000 mg | INHALATION_SOLUTION | RESPIRATORY_TRACT | Status: DC | PRN

## 2014-03-19 NOTE — Progress Notes (Signed)
Fentanyl PCA, 40 ml, wasted in sink. Witness by Pecolia Ades, RN. Connye Burkitt, RN

## 2014-03-19 NOTE — Progress Notes (Addendum)
[facility] DAILY ICU PROGRESS NOTE                   [address]            [place] [address]          [phone]   2 Days Post-Op Procedure(s) (LRB): VIDEO ASSISTED THORACOSCOPY (VATS)/WEDGE RESECTION (Right)  Total Length of Stay:  LOS: 2 days   Subjective: conts to progress nicely   Objective: Vital signs in last 24 hours: Temp:  [97.7 F (36.5 C)-98.5 F (36.9 C)] 98.5 F (36.9 C) (05/16 8588) Pulse Rate:  [54-61] 56 (05/16 0633) Cardiac Rhythm:  [-] Sinus bradycardia (05/16 0400) Resp:  [11-18] 15 (05/16 0746) BP: (112-150)/(58-70) 128/58 mmHg (05/16 0633) SpO2:  [92 %-99 %] 99 % (05/16 0746)  Filed Weights   03/17/14 0548 03/17/14 1330  Weight: 148 lb 2.4 oz (67.2 kg) 156 lb 15.5 oz (71.2 kg)    Weight change:    Hemodynamic parameters for last 24 hours:    Intake/Output from previous day: 05/15 0701 - 05/16 0700 In: 740 [P.O.:240; I.V.:500] Out: 725 [Urine:575; Chest Tube:150]  Intake/Output this shift:    Current Meds: Scheduled Meds: . acetaminophen  1,000 mg Oral 4 times per day   Or  . acetaminophen (TYLENOL) oral liquid 160 mg/5 mL  1,000 mg Oral 4 times per day  . albuterol  2.5 mg Nebulization BID  . antiseptic oral rinse  15 mL Mouth Rinse BID  . aspirin EC  81 mg Oral Daily  . bisacodyl  10 mg Oral Daily  . enoxaparin (LOVENOX) injection  40 mg Subcutaneous Q24H  . fentaNYL   Intravenous 6 times per day  . hydrochlorothiazide  25 mg Oral Daily  . lisinopril  40 mg Oral Daily  . senna-docusate  1 tablet Oral QHS   Continuous Infusions: . dextrose 5 % and 0.9% NaCl 50 mL/hr at 03/19/14 0320   PRN Meds:.diphenhydrAMINE, diphenhydrAMINE, naloxone, ondansetron (ZOFRAN) IV, potassium chloride, sodium chloride, traMADol  General appearance: alert, cooperative and no distress Heart: regular rate and rhythm and brady Lungs: clear Abdomen: benign Extremities: no edema Wound: incis healing well  Lab Results: CBC: Recent  Labs  03/18/14 0425 03/19/14 0600  WBC 6.3 6.3  HGB 12.0* 12.3*  HCT 35.5* 37.4*  PLT 123* 131*   BMET:  Recent Labs  03/18/14 0425 03/19/14 0600  NA 133* 140  K 3.5* 3.9  CL 97 102  CO2 27 27  GLUCOSE 119* 120*  BUN 9 9  CREATININE 0.85 0.78  CALCIUM 8.2* 8.7    PT/INR: No results found for this basename: LABPROT, INR,  in the last 72 hours Radiology: Dg Chest 1v Repeat Same Day  03/18/2014   CLINICAL DATA:  Followup right pneumothorax. Postop from resection of benign lung tumor.  EXAM: CHEST - 1 VIEW SAME DAY  COMPARISON:  03/18/2014  FINDINGS: Right chest tube remains in place. Subcutaneous emphysema again seen in the right chest wall but no residual pneumothorax identified. Mild atelectasis or infiltrate again seen in the right upper lobe. Left lung is clear. Heart size is normal. Right jugular central venous catheter remains in appropriate position.  IMPRESSION: Right chest tube in appropriate position. No residual pneumothorax visualized.  Stable right upper lobe atelectasis versus infiltrate.   Electronically Signed   By: Earle Gell M.D.   On: 03/18/2014 12:32   Dg Chest Port 1 View  03/18/2014   CLINICAL DATA:  Right chest tube.  EXAM: PORTABLE CHEST - 1 VIEW  COMPARISON:  03/17/2014  FINDINGS: The right chest tube remains near the right lung apex. Postsurgical changes are noted in the right upper lung. There appears to be increased or new subcutaneous gas in the right chest. Question a pleural line along the periphery of the right chest and near the right lung apex. Heart size is normal. The trachea is midline. Left lung is clear. Central line is stable with the tip in the SVC region.  IMPRESSION: Question a small right pneumothorax with increased subcutaneous gas in the right chest.  Stable position of the right chest tube.   Electronically Signed   By: Markus Daft M.D.   On: 03/18/2014 07:57     Assessment/Plan: S/P Procedure(s) (LRB): VIDEO ASSISTED THORACOSCOPY  (VATS)/WEDGE RESECTION (Right)  1 doing well 2 d/c chest tube 3 requests laxative, will order MOM 4 d/c PCA 5d/c ivf 6 poss home 1-2 days     [name] 03/19/2014 11:00 AM Chest tube out today I have seen and examined Wayne Rodriguez and agree with the above assessment  and plan.  Grace Isaac MD [facility] [phone] Office [phone] 03/19/2014 1:38 PM

## 2014-03-20 ENCOUNTER — Inpatient Hospital Stay (HOSPITAL_COMMUNITY): Payer: Medicare HMO

## 2014-03-20 MED ORDER — TRAMADOL HCL 50 MG PO TABS: 50.0000 mg | ORAL_TABLET | Freq: Four times a day (QID) | ORAL | Status: DC | PRN

## 2014-03-20 NOTE — Progress Notes (Signed)
[address]       [name] [address]             [phone]      3 Days Post-Op Procedure(s) (LRB): VIDEO ASSISTED THORACOSCOPY (VATS)/WEDGE RESECTION (Right) Subjective: Feels quite well overall  Objective: Vital signs in last 24 hours: Temp:  [97.8 F (36.6 C)-98.4 F (36.9 C)] 98.1 F (36.7 C) (05/17 0726) Pulse Rate:  [58-74] 58 (05/17 0726) Cardiac Rhythm:  [-] Normal sinus rhythm (05/17 0726) Resp:  [12-18] 16 (05/17 0726) BP: (131-146)/(54-78) 131/61 mmHg (05/17 0726) SpO2:  [92 %-100 %] 100 % (05/17 0726)  Hemodynamic parameters for last 24 hours:    Intake/Output from previous day: 05/16 0701 - 05/17 0700 In: 840 [P.O.:840] Out: 40 [Chest Tube:40] Intake/Output this shift:    General appearance: alert, cooperative and no distress Heart: regular rate and rhythm Lungs: clear to auscultation bilaterally Abdomen: benign Extremities: no edema Wound: incis healing well  Lab Results:  Recent Labs  03/18/14 0425 03/19/14 0600  WBC 6.3 6.3  HGB 12.0* 12.3*  HCT 35.5* 37.4*  PLT 123* 131*   BMET:  Recent Labs  03/18/14 0425 03/19/14 0600  NA 133* 140  K 3.5* 3.9  CL 97 102  CO2 27 27  GLUCOSE 119* 120*  BUN 9 9  CREATININE 0.85 0.78  CALCIUM 8.2* 8.7    PT/INR: No results found for this basename: LABPROT, INR,  in the last 72 hours ABG    Component Value Date/Time   PHART 7.411 03/18/2014 0423   HCO3 28.8* 03/18/2014 0423   TCO2 30.2 03/18/2014 0423   O2SAT 96.5 03/18/2014 0423   CBG (last 3)  No results found for this basename: GLUCAP,  in the last 72 hours Scheduled Meds: . acetaminophen  1,000 mg Oral 4 times per day   Or  . acetaminophen (TYLENOL) oral liquid 160 mg/5 mL  1,000 mg Oral 4 times per day  . antiseptic oral rinse  15 mL Mouth Rinse BID  . aspirin EC  81 mg Oral Daily  . bisacodyl  10 mg Oral Daily  . enoxaparin (LOVENOX) injection  40 mg Subcutaneous Q24H  . hydrochlorothiazide  25 mg Oral Daily   . lisinopril  40 mg Oral Daily  . senna-docusate  1 tablet Oral QHS   Continuous Infusions:  PRN Meds:.albuterol, magnesium hydroxide, potassium chloride, traMADol Dg Chest 2 View  03/20/2014   CLINICAL DATA:  Followup right apical pneumothorax following chest tube removal.  EXAM: CHEST  2 VIEW  COMPARISON:  Yesterday.  FINDINGS: No significant change in a less than 5% right apical pneumothorax. Adjacent patchy opacity in the right upper lobe has also not changed significantly with multiple surgical staples noted in that region. The remainder of the lungs are clear and mildly high expanded. Normal sized heart. Thoracic spine degenerative changes. Persistent subcutaneous emphysema overlying the right shoulder.  IMPRESSION: 1. Stable less than 5% right apical pneumothorax. 2. Stable right upper lobe pneumonia or focal edema following surgery. 3. COPD.   Electronically Signed   By: Enrique Sack M.D.   On: 03/20/2014 06:30   Dg Chest 2 View  03/19/2014   CLINICAL DATA:  Chest tube placed  EXAM: CHEST - 2 VIEW  COMPARISON:  DG CHEST 1VSAME DAY dated 03/18/2014  FINDINGS: Normal heart size. Right internal jugular central venous catheter and right chest tube stable. Tiny right apical pneumothorax is stable. Emphysema in the right axilla is  stable. Hazy airspace disease in the medial right upper lobe.  IMPRESSION: Tiny right apical pneumothorax. Stable hazy medial right apical airspace disease.   Electronically Signed   By: Maryclare Bean M.D.   On: 03/19/2014 12:24   Dg Chest 1v Repeat Same Day  03/18/2014   CLINICAL DATA:  Followup right pneumothorax. Postop from resection of benign lung tumor.  EXAM: CHEST - 1 VIEW SAME DAY  COMPARISON:  03/18/2014  FINDINGS: Right chest tube remains in place. Subcutaneous emphysema again seen in the right chest wall but no residual pneumothorax identified. Mild atelectasis or infiltrate again seen in the right upper lobe. Left lung is clear. Heart size is normal. Right jugular  central venous catheter remains in appropriate position.  IMPRESSION: Right chest tube in appropriate position. No residual pneumothorax visualized.  Stable right upper lobe atelectasis versus infiltrate.   Electronically Signed   By: Earle Gell M.D.   On: 03/18/2014 12:32   Dg Chest Port 1 View  03/18/2014   CLINICAL DATA:  Right chest tube.  EXAM: PORTABLE CHEST - 1 VIEW  COMPARISON:  03/17/2014  FINDINGS: The right chest tube remains near the right lung apex. Postsurgical changes are noted in the right upper lung. There appears to be increased or new subcutaneous gas in the right chest. Question a pleural line along the periphery of the right chest and near the right lung apex. Heart size is normal. The trachea is midline. Left lung is clear. Central line is stable with the tip in the SVC region.  IMPRESSION: Question a small right pneumothorax with increased subcutaneous gas in the right chest.  Stable position of the right chest tube.   Electronically Signed   By: Markus Daft M.D.   On: 03/18/2014 07:57   Dg Chest Port 1 View  03/17/2014   CLINICAL DATA:  Postop video-assisted thoracoscopy surgery  EXAM: PORTABLE CHEST - 1 VIEW  COMPARISON:  DG CHEST 2 VIEW dated 03/15/2014  FINDINGS: Right chest tube been placed without pneumothorax. Right internal jugular central venous catheter with its tip in the lower SVC. Left lung is clear. Patchy opacity at the medial right apex. No pleural effusion.  IMPRESSION: Right chest tube in place without pneumothorax.  Right internal jugular central venous catheter tip in the SVC.   Electronically Signed   By: Maryclare Bean M.D.   On: 03/17/2014 10:52   Dg Chest Port 1v Same Day  03/19/2014   CLINICAL DATA:  Chest tube removal  EXAM: PORTABLE CHEST - 1 VIEW SAME DAY  COMPARISON:  DG CHEST 2 VIEW dated 03/19/2014  FINDINGS: Trace persistent right apical pneumothorax is reidentified with overlying subcutaneous emphysema after removal of right-sided chest tube. No significant  change allowing for differences in technique. No mediastinal shift.  IMPRESSION: No change in trace right apical pneumothorax without mediastinal shift, after removal of chest tube.   Electronically Signed   By: Conchita Paris M.D.   On: 03/19/2014 19:03   Assessment/Plan: S/P Procedure(s) (LRB): VIDEO ASSISTED THORACOSCOPY (VATS)/WEDGE RESECTION (Right)  1 quite stable for discharge     LOS: 3 days    Wayne Rodriguez 03/20/2014

## 2014-03-20 NOTE — Progress Notes (Signed)
Pt discharged home per MD order. All discharge instructions reviewed and all questions answered. Surgical site clean, dry, and without signs of infections.

## 2014-03-20 NOTE — Progress Notes (Signed)
Right IJ removed per MD order. Pt tolerated procedure well. Will continue to monitor.

## 2014-03-21 NOTE — Anesthesia Postprocedure Evaluation (Signed)
Anesthesia Post Note  Patient: Wayne Rodriguez  Procedure(s) Performed: Procedure(s) (LRB): VIDEO ASSISTED THORACOSCOPY (VATS)/WEDGE RESECTION (Right)  Anesthesia type: General  Patient location: PACU  Post pain: Pain level controlled and Adequate analgesia  Post assessment: Post-op Vital signs reviewed, Patient's Cardiovascular Status Stable, Respiratory Function Stable, Patent Airway and Pain level controlled  Last Vitals:  Filed Vitals:   03/20/14 0726  BP: 131/61  Pulse: 58  Temp: 36.7 C  Resp: 16    Post vital signs: Reviewed and stable  Level of consciousness: awake, alert  and oriented  Complications: No apparent anesthesia complications

## 2014-03-25 ENCOUNTER — Encounter (INDEPENDENT_AMBULATORY_CARE_PROVIDER_SITE_OTHER): Payer: Commercial Managed Care - HMO

## 2014-03-25 DIAGNOSIS — I2510 Atherosclerotic heart disease of native coronary artery without angina pectoris: Secondary | ICD-10-CM

## 2014-04-04 ENCOUNTER — Other Ambulatory Visit: Payer: Self-pay | Admitting: Thoracic Surgery (Cardiothoracic Vascular Surgery)

## 2014-04-04 DIAGNOSIS — D1430 Benign neoplasm of unspecified bronchus and lung: Secondary | ICD-10-CM

## 2014-04-05 ENCOUNTER — Encounter (HOSPITAL_COMMUNITY): Payer: Self-pay

## 2014-04-05 ENCOUNTER — Ambulatory Visit
Admission: RE | Admit: 2014-04-05 | Discharge: 2014-04-05 | Disposition: A | Discharge status: Home / Self Care | Payer: Commercial Managed Care - HMO | Source: Ambulatory Visit | Attending: Thoracic Surgery (Cardiothoracic Vascular Surgery) | Admitting: Thoracic Surgery (Cardiothoracic Vascular Surgery)

## 2014-04-05 ENCOUNTER — Ambulatory Visit (INDEPENDENT_AMBULATORY_CARE_PROVIDER_SITE_OTHER): Payer: Commercial Managed Care - HMO | Admitting: Thoracic Surgery (Cardiothoracic Vascular Surgery)

## 2014-04-05 ENCOUNTER — Encounter: Payer: Self-pay | Admitting: Thoracic Surgery (Cardiothoracic Vascular Surgery)

## 2014-04-05 VITALS — BP 137/74 | HR 62 | Resp 20 | Ht 69.0 in | Wt 150.0 lb

## 2014-04-05 DIAGNOSIS — Z09 Encounter for follow-up examination after completed treatment for conditions other than malignant neoplasm: Secondary | ICD-10-CM

## 2014-04-05 DIAGNOSIS — D1430 Benign neoplasm of unspecified bronchus and lung: Secondary | ICD-10-CM

## 2014-04-05 NOTE — Progress Notes (Signed)
HPI:  [name] returns today for a scheduled postoperative followup visit.  He is a 68 year old gentleman who had nodules in his right upper lobe there were hypermetabolic on PET. He had a thoracoscopic wedge resection of the right upper lobe nodules as well as an old calcified granuloma on May 14. The nodules were inflammatory, so a further resection was not necessary. He did well postoperatively and went home on day 3.  He continues to do well. He does notice some hypersensitivity of the skin in his right pectoral region. He is not taking narcotics. He has started driving. He mowed his lawn a couple of days ago.  Past Medical History  Diagnosis Date  . Chest pain     a. 02/2010 Cath: normal  . HTN (hypertension)   . GERD (gastroesophageal reflux disease)   . Back pain   . Aortic root dilatation     a. 09/2010 CT: 4.1cm Max Asc Ao diam, 3.7cm Max Desc Ao diam;; s/p MRI/MRA in April 2013  . Decreased exercise tolerance     a. 02/2012  . Palpitations     a. h/o palps r/t caffeine - has occurred again 02/2012  . Pulmonary nodule   . Vertigo     hx of  . Cancer     skin cancer removed from face      Current Outpatient Prescriptions  Medication Sig Dispense Refill  . aspirin 81 MG tablet Take 81 mg by mouth daily.        . Cholecalciferol (VITAMIN D-3 PO) Take 1 tablet by mouth daily.       Marland Kitchen Cod Liver Oil 1000 MG CAPS Take 1 capsule by mouth daily.       . Cyanocobalamin (VITAMIN B-12 PO) Take 1 tablet by mouth daily.       . Flaxseed, Linseed, (FLAX SEED OIL PO) Take 1 tablet by mouth daily.       Marland Kitchen lisinopril-hydrochlorothiazide (PRINZIDE,ZESTORETIC) 20-12.5 MG per tablet Take 2 tablets by mouth daily.  180 tablet  0  . nitroGLYCERIN (NITROSTAT) 0.4 MG SL tablet Place 1 tablet (0.4 mg total) under the tongue every 5 (five) minutes as needed.  25 tablet  11  . Probiotic Product (PROBIOTIC DAILY PO) Take 1 capsule by mouth daily.        No current facility-administered  medications for this visit.    Physical Exam BP 137/74  Pulse 62  Resp 20  Ht 5\' 9"  (1.753 m)  Wt 150 lb (68.04 kg)  BMI 22.14 kg/m2  SpO46 25% 67 year old gentleman in no acute distress Well-developed and well-nourished Incisions healing well Lungs clear with equal breath sounds bilaterally  Diagnostic Tests: CHEST 2 VIEW  COMPARISON: Chest x-ray of 03/20/2014  FINDINGS:  Postoperative sutures and atelectasis/ scarring remain in the right  upper lung new the apex. The lungs are hyperaerated. There may be a  tiny right pleural effusion present. The left lung is clear. Heart  size is stable. There are degenerative changes throughout the  thoracic spine.  IMPRESSION:  No pneumothorax. Stable postoperative change in the right lung apex.  Probable tiny right effusion.  Electronically Signed  By: Ivar Drape M.D.  On: 04/05/2014 13:05   Impression: 68 year old gentleman who recently underwent a wedge resection for hypermetabolic nodule in his right upper lobe. This turned out to be an inflammatory nodule. In fact he had multiple inflammatory nodules noted. No cancer was found. He did well postoperatively and is making good progress since discharge.  He may gradually increase his activities as tolerated. He may drive, appropriate precautions were discussed.  His work involves heavy lifting. We set a tentative return to work date of July 13, about 2 months postoperatively.  Plan: These lesions were benign but he has a relatively high risk for lung cancer given his smoking history and Agent Orange exposure in the past. I recommended that we repeat a CT on him in about a year. I will see him back at that time

## 2014-04-11 ENCOUNTER — Telehealth: Payer: Self-pay | Admitting: Nurse Practitioner

## 2014-04-11 NOTE — Telephone Encounter (Signed)
New message          Pt would like for lori to give him a call / pt would not disclose any other information

## 2014-04-11 NOTE — Telephone Encounter (Signed)
S/w pt had lung surgery 3 weeks ago stated for about two weeks heart has been racing.  Just got in from walking in the garage no exertion pt took bp, 137/70 and hr 90.  Stated after pt eats pt feels heart racing and rib pressure, stated doesn't feel like food is digesting.  Since out of work started on caffeine a little bit, couple of ounces a day.  Is going to [place] next week and would like to be checked out. I stated do not have any openings and would have to discuss with Cecille Rubin in the am.  Pt aware and that I will call back tomorrow.

## 2014-04-12 NOTE — Telephone Encounter (Signed)
Will see at 2pm tomorrow.

## 2014-04-12 NOTE — Telephone Encounter (Signed)
S/w pt made appointment with Truitt Merle, NP, tomorrow, pt is aware and agreeable to plan

## 2014-04-13 ENCOUNTER — Ambulatory Visit (INDEPENDENT_AMBULATORY_CARE_PROVIDER_SITE_OTHER): Payer: Commercial Managed Care - HMO | Admitting: Nurse Practitioner

## 2014-04-13 ENCOUNTER — Encounter: Payer: Self-pay | Admitting: Nurse Practitioner

## 2014-04-13 ENCOUNTER — Encounter (INDEPENDENT_AMBULATORY_CARE_PROVIDER_SITE_OTHER): Payer: Self-pay

## 2014-04-13 ENCOUNTER — Encounter: Payer: Self-pay | Admitting: Internal Medicine

## 2014-04-13 VITALS — BP 110/60 | HR 71 | Ht 69.0 in | Wt 147.8 lb

## 2014-04-13 DIAGNOSIS — R002 Palpitations: Secondary | ICD-10-CM

## 2014-04-13 NOTE — Patient Instructions (Signed)
I think you are doing ok  Think about seeing the GI doctor if your swallowing issues continue and you keep losing weight  See Dr. Martinique back as planned  Call the [place] office at [phone] if you have any questions, problems or concerns.

## 2014-04-13 NOTE — Progress Notes (Signed)
Wayne Rodriguez Date of Birth: 1946/04/01 Medical Record #[id number]  History of Present Illness: Mr. Rewerts is seen back today for a work in visit. He is seen for Dr. Martinique. He has no known CAD per remote cath. Does have an enlarged aortic root with MRA last in April of 2013. Has had palpitations with a negative event monitor back last April of 2013. Other issues include HTN.   Last seen by me in February of 2014.  Has had recent lung surgery for nodules and had a VATS with wedge with Dr. Roxan Hockey. These lesions were benign but he has a relatively high risk for lung cancer given his smoking history and Agent Orange exposure in the past. He is to have a repeat a CT in about a year   Called yesterday with palpitations - going out of town next week. Thus added to my schedule.   He comes in today. He is here alone. He now feels fine - says he was drinking coffee and had M&Ms which caused this most recent episode. He now feels fine. Not eating very much - has lost 9 pounds in a month. Some dysphagia noted. No chest pain. Some incisional pain but that is improving. Not short of breath. Not dizzy. No syncope.   Current Outpatient Prescriptions  Medication Sig Dispense Refill  . aspirin 81 MG tablet Take 81 mg by mouth daily.        . Cholecalciferol (VITAMIN D-3 PO) Take 1 tablet by mouth daily.       Marland Kitchen Cod Liver Oil 1000 MG CAPS Take 1 capsule by mouth daily.       . Cyanocobalamin (VITAMIN B-12 PO) Take 1 tablet by mouth daily.       . Flaxseed, Linseed, (FLAX SEED OIL PO) Take 1 tablet by mouth daily.       Marland Kitchen lisinopril-hydrochlorothiazide (PRINZIDE,ZESTORETIC) 20-12.5 MG per tablet Take 2 tablets by mouth daily.  180 tablet  0  . nitroGLYCERIN (NITROSTAT) 0.4 MG SL tablet Place 1 tablet (0.4 mg total) under the tongue every 5 (five) minutes as needed.  25 tablet  11  . Probiotic Product (PROBIOTIC DAILY PO) Take 1 capsule by mouth daily.        No current facility-administered  medications for this visit.    Allergies  Allergen Reactions  . Beta Adrenergic Blockers     Bradycardia   . Codeine     REACTION: nausea/vomiting    Past Medical History  Diagnosis Date  . Chest pain     a. 02/2010 Cath: normal  . HTN (hypertension)   . GERD (gastroesophageal reflux disease)   . Back pain   . Aortic root dilatation     a. 09/2010 CT: 4.1cm Max Asc Ao diam, 3.7cm Max Desc Ao diam;; s/p MRI/MRA in April 2013  . Decreased exercise tolerance     a. 02/2012  . Palpitations     a. h/o palps r/t caffeine - has occurred again 02/2012  . Pulmonary nodule   . Vertigo     hx of  . Cancer     skin cancer removed from face    Past Surgical History  Procedure Laterality Date  . Back surgery  1983  . Eye surgery  1997    remove foreign body; right  . Colonoscopy w/ polypectomy    . Cardiac catheterization      no PCI  . Video assisted thoracoscopy (vats)/wedge resection Right 03/17/2014    Procedure:  VIDEO ASSISTED THORACOSCOPY (VATS)/WEDGE RESECTION;  Surgeon: Melrose Nakayama, MD;  Location: [facility];  Service: Thoracic;  Laterality: Right;    History  Smoking status  . Former Smoker -- 1.00 packs/day for 30 years  . Types: Cigarettes  . Start date: 03/08/1962  . Quit date: 03/08/1992  Smokeless tobacco  . Former Systems developer  . Quit date: 01/06/2013    History  Alcohol Use No    Family History  Problem Relation Age of Onset  . Lung cancer Mother   . Colon cancer Sister 74    Review of Systems: The review of systems is per the HPI.  All other systems were reviewed and are negative.  Physical Exam: BP 110/60  Pulse 71  Ht 5\' 9"  (1.753 m)  Wt 147 lb 12.8 oz (67.042 kg)  BMI 21.82 kg/m2  SpO2 100% Patient is very pleasant and in no acute distress. He is quite thin. Weight is down. Skin is warm and dry. Color is normal.  HEENT is unremarkable. Normocephalic/atraumatic. PERRL. Sclera are nonicteric. Neck is supple. No masses. No JVD. Lungs are clear.  Cardiac exam shows a regular rate and rhythm. Abdomen is soft. Extremities are without edema. Gait and ROM are intact. No gross neurologic deficits noted.  Wt Readings from Last 3 Encounters:  04/13/14 147 lb 12.8 oz (67.042 kg)  04/05/14 150 lb (68.04 kg)  03/17/14 156 lb 15.5 oz (71.2 kg)     LABORATORY DATA:  EKG today with sinus rhythm - early repolarization - reviewed with Dr. Ron Parker (DOD) - he agrees Lab Results  Component Value Date   WBC 6.3 03/19/2014   HGB 12.3* 03/19/2014   HCT 37.4* 03/19/2014   PLT 131* 03/19/2014   GLUCOSE 120* 03/19/2014   CHOL 151 10/19/2012   TRIG 114.0 10/19/2012   HDL 43.60 10/19/2012   LDLCALC 85 10/19/2012   ALT 9 03/19/2014   AST 17 03/19/2014   NA 140 03/19/2014   K 3.9 03/19/2014   CL 102 03/19/2014   CREATININE 0.78 03/19/2014   BUN 9 03/19/2014   CO2 27 03/19/2014   TSH 1.50 02/17/2012   INR 1.11 03/15/2014   HGBA1C  Value: 5.1 (NOTE)                                                                       According to the ADA Clinical Practice Recommendations for 2011, when HbA1c is used as a screening test:   >=6.5%   Diagnostic of Diabetes Mellitus           (if abnormal result  is confirmed)  5.7-6.4%   Increased risk of developing Diabetes Mellitus  References:Diagnosis and Classification of Diabetes Mellitus,Diabetes MGQQ,7619,50(DTOIZ 1):S62-S69 and Standards of Medical Care in         Diabetes - 2011,Diabetes [id number],8099,83  (Suppl 1):S11-S61. 02/24/2010     Assessment / Plan:  1. HTN - BP looks good.   2. Palpitations - resolved with cessation of coffee and chocolate   3. Enlarged aortic root - will need repeat scan in April of 2016  4. Lung nodules - s/p VATs with wedge - followed by Dr. Roxan Hockey  5. Weight loss - encouraged to see GI, eat smaller meals more frequently, supplement  with Boost, Ensure, etc.  See back as planned. No change in his medicines.   Patient is agreeable to this plan and will call if any problems develop in the  interim.   Burtis Junes, RN, [facility] [address] [place], [place]  [id number] [phone]

## 2014-04-15 ENCOUNTER — Telehealth: Payer: Self-pay | Admitting: Nurse Practitioner

## 2014-04-15 ENCOUNTER — Other Ambulatory Visit: Payer: Self-pay | Admitting: *Deleted

## 2014-04-15 DIAGNOSIS — R1310 Dysphagia, unspecified: Secondary | ICD-10-CM

## 2014-04-15 NOTE — Telephone Encounter (Signed)
S/w pt wants to see GI was referred to [facility], Dr. Cristina Gong sent to scheduling DX: dysphagia

## 2014-04-15 NOTE — Telephone Encounter (Signed)
New message         Pt would like to know the name and phone number of GI doctor that he was referred to

## 2014-05-14 ENCOUNTER — Ambulatory Visit (INDEPENDENT_AMBULATORY_CARE_PROVIDER_SITE_OTHER): Payer: Commercial Managed Care - HMO | Admitting: Family Medicine

## 2014-05-14 VITALS — BP 110/60 | HR 53 | Temp 97.5°F | Resp 16 | Ht 68.5 in | Wt 150.5 lb

## 2014-05-14 DIAGNOSIS — IMO0001 Reserved for inherently not codable concepts without codable children: Secondary | ICD-10-CM

## 2014-05-14 DIAGNOSIS — M7918 Myalgia, other site: Secondary | ICD-10-CM

## 2014-05-14 MED ORDER — MELOXICAM 7.5 MG PO TABS: 7.5000 mg | ORAL_TABLET | Freq: Two times a day (BID) | ORAL | Status: DC | PRN

## 2014-05-14 MED ORDER — METHOCARBAMOL 750 MG PO TABS: 750.0000 mg | ORAL_TABLET | Freq: Four times a day (QID) | ORAL | Status: DC

## 2014-05-14 NOTE — Progress Notes (Signed)
Subjective:  This chart was scribed for Delman Cheadle, MD by Jeanell Sparrow, ED Scribe. This patient was seen in room 12 and the patient's care was started at 1:18 PM. Chief Complaint  Patient presents with  . Shoulder Pain    Right-started yesterday morning-no injury     Patient ID: Wayne Rodriguez, male    DOB: [birth year], 68 y.o.   MRN: [id number]  HPI HPI Comments: Wayne Rodriguez is a 68 y.o. male who presents to the [facility] complaining of right shoulder pain that started yesterday.  He states that the pain is on the back part of his right shoulder.   He states that he used ice and tylenol with some relief.  He also reports that he had "nodules" on his upper extremities that he was told was possibly cancerous. He states that his son had the same thing.  He reports that has had no visual disturbance.  Had right lung surgery about 2 months ago.  He states that he has been using a new pillow.  Past Medical History  Diagnosis Date  . Chest pain     a. 02/2010 Cath: normal  . HTN (hypertension)   . GERD (gastroesophageal reflux disease)   . Back pain   . Aortic root dilatation     a. 09/2010 CT: 4.1cm Max Asc Ao diam, 3.7cm Max Desc Ao diam;; s/p MRI/MRA in April 2013  . Decreased exercise tolerance     a. 02/2012  . Palpitations     a. h/o palps r/t caffeine - has occurred again 02/2012  . Pulmonary nodule   . Vertigo     hx of  . Cancer     skin cancer removed from face   Current Outpatient Prescriptions on File Prior to Visit  Medication Sig Dispense Refill  . aspirin 81 MG tablet Take 81 mg by mouth daily.        . Cholecalciferol (VITAMIN D-3 PO) Take 1 tablet by mouth daily.       Marland Kitchen Cod Liver Oil 1000 MG CAPS Take 1 capsule by mouth daily.       . Cyanocobalamin (VITAMIN B-12 PO) Take 1 tablet by mouth daily.       . Flaxseed, Linseed, (FLAX SEED OIL PO) Take 1 tablet by mouth daily.       Marland Kitchen lisinopril-hydrochlorothiazide  (PRINZIDE,ZESTORETIC) 20-12.5 MG per tablet Take 2 tablets by mouth daily.  180 tablet  0  . nitroGLYCERIN (NITROSTAT) 0.4 MG SL tablet Place 1 tablet (0.4 mg total) under the tongue every 5 (five) minutes as needed.  25 tablet  11  . Probiotic Product (PROBIOTIC DAILY PO) Take 1 capsule by mouth daily.        No current facility-administered medications on file prior to visit.   Allergies  Allergen Reactions  . Beta Adrenergic Blockers     Bradycardia   . Codeine     REACTION: nausea/vomiting    Review of Systems  Eyes: Negative for visual disturbance.  Musculoskeletal: Positive for myalgias.   BP 110/60  Pulse 53  Temp(Src) 97.5 F (36.4 C) (Oral)  Resp 16  Ht 5' 8.5" (1.74 m)  Wt 150 lb 8 oz (68.266 kg)  BMI 22.55 kg/m2  SpO2 98%     Objective:   Physical Exam  Nursing note and vitals reviewed. Constitutional: He is oriented to person, place, and time. He appears well-developed and well-nourished. No distress.  HENT:  Head: Normocephalic  and atraumatic.  Mouth/Throat: Oropharynx is clear and moist. No oropharyngeal exudate.  Eyes: Conjunctivae and EOM are normal. Pupils are equal, round, and reactive to light.  Neck: Normal range of motion. Neck supple.  No meningismus.  Cardiovascular: Normal rate, regular rhythm, normal heart sounds and intact distal pulses.   No murmur heard. Pulmonary/Chest: Effort normal and breath sounds normal. No respiratory distress.  Abdominal: Soft. There is no tenderness. There is no rebound and no guarding.  Musculoskeletal: Normal range of motion. He exhibits tenderness. He exhibits no edema.  Medial superior aspect of right scapula large has a visible and palable muscle spasm. TTP with no fluctuance or induration poorly defined. Rt shoulder pain with right lateral neck rotation  Full ROM bilateral shoulder. 5/5 strength. Normal rotator cuff    Neurological: He is alert and oriented to person, place, and time. No cranial nerve  deficit. He exhibits normal muscle tone. Coordination normal.  No ataxia on finger to nose bilaterally. No pronator drift. 5/5 strength throughout. CN 2-12 intact. Negative Romberg. Equal grip strength. Sensation intact. Gait is normal.   Skin: Skin is warm.  Psychiatric: He has a normal mood and affect. His behavior is normal.          Assessment & Plan:   Rhomboid myalgia  Meds ordered this encounter  Medications  . methocarbamol (ROBAXIN) 750 MG tablet    Sig: Take 1 tablet (750 mg total) by mouth 4 (four) times daily.    Dispense:  40 tablet    Refill:  1  . meloxicam (MOBIC) 7.5 MG tablet    Sig: Take 1 tablet (7.5 mg total) by mouth 2 (two) times daily as needed for pain.    Dispense:  60 tablet    Refill:  0    I personally performed the services described in this documentation, which was scribed in my presence. The recorded information has been reviewed and considered, and addended by me as needed.  Delman Cheadle, MD MPH

## 2014-05-14 NOTE — Patient Instructions (Addendum)
I think you have a large rhomboid muscle spasm.  I recommend keeping the meloxicam on board - esp taking before and during work. Then when you come home, take a muscle relaxant followed by 15 minutes of heat followed by gentle stretching and massage. Try to do this regiment 2 - 3 times a day.  If you are still having pain in 4 to 6 wks, come back to clinic for further eval. RTC immed if symptoms worsen or you develop any other symptoms.  If your pain does not improve, please return to clinic so we can determine if an xray, injection, or physical therapy is necessary.  Thoracic Strain You have injured the muscles or tendons that attach to the upper part of your back behind your chest. This injury is called a thoracic strain, thoracic sprain, or mid-back strain.  CAUSES  The cause of thoracic strain varies. A less severe injury involves pulling a muscle or tendon without tearing it. A more severe injury involves tearing (rupturing) a muscle or tendon. With less severe injuries, there may be little loss of strength. Sometimes, there are breaks (fractures) in the bones to which the muscles are attached. These fractures are rare, unless there was a direct hit (trauma) or you have weak bones due to osteoporosis or age. Longstanding strains may be caused by overuse or improper form during certain movements. Obesity can also increase your risk for back injuries. Sudden strains may occur due to injury or not warming up properly before exercise. Often, there is no obvious cause for a thoracic strain. SYMPTOMS  The main symptom is pain, especially with movement, such as during exercise. DIAGNOSIS  Your caregiver can usually tell what is wrong by taking an X-ray and doing a physical exam. TREATMENT   Physical therapy may be helpful for recovery. Your caregiver can give you exercises to do or refer you to a physical therapist after your pain improves.  After your pain improves, strengthening and conditioning  programs appropriate for your sport or occupation may be helpful.  Always warm up before physical activities or athletics. Stretching after physical activity may also help.  Certain over-the-counter medicines may also help. Ask your caregiver if there are medicines that would help you. If this is your first thoracic strain injury, proper care and proper healing time before starting activities should prevent long-term problems. Torn ligaments and tendons require as long to heal as broken bones. Average healing times may be only 1 week for a mild strain. For torn muscles and tendons, healing time may be up to 6 weeks to 2 months. HOME CARE INSTRUCTIONS   Apply ice to the injured area. Ice massages may also be used as directed.  Put ice in a plastic bag.  Place a towel between your skin and the bag.  Leave the ice on for 15-20 minutes, 03-04 times a day, for the first 2 days.  Only take over-the-counter or prescription medicines for pain, discomfort, or fever as directed by your caregiver.  Keep your appointments for physical therapy if this was prescribed.  Use wraps and back braces as instructed. SEEK IMMEDIATE MEDICAL CARE IF:   You have an increase in bruising, swelling, or pain.  Your pain has not improved with medicines.  You develop new shortness of breath, chest pain, or fever.  Problems seem to be getting worse rather than better. MAKE SURE YOU:   Understand these instructions.  Will watch your condition.  Will get help right away if you  are not doing well or get worse. Document Released: 01/11/2004 Document Revised: 01/13/2012 Document Reviewed: 12/07/2010 [facility] Patient Information 2015 [place], [place]. This information is not intended to replace advice given to you by your health care provider. Make sure you discuss any questions you have with your health care provider.

## 2014-06-03 ENCOUNTER — Other Ambulatory Visit: Payer: Self-pay

## 2014-06-03 MED ORDER — LISINOPRIL-HYDROCHLOROTHIAZIDE 20-12.5 MG PO TABS: 2.0000 | ORAL_TABLET | Freq: Every day | ORAL | Status: DC

## 2014-09-19 ENCOUNTER — Other Ambulatory Visit: Payer: Self-pay | Admitting: *Deleted

## 2014-09-19 MED ORDER — LISINOPRIL-HYDROCHLOROTHIAZIDE 20-12.5 MG PO TABS: 2.0000 | ORAL_TABLET | Freq: Every day | ORAL | Status: DC

## 2014-09-28 ENCOUNTER — Other Ambulatory Visit: Payer: Self-pay | Admitting: *Deleted

## 2014-09-28 MED ORDER — LISINOPRIL-HYDROCHLOROTHIAZIDE 20-12.5 MG PO TABS: 2.0000 | ORAL_TABLET | Freq: Every day | ORAL | Status: DC

## 2015-01-05 ENCOUNTER — Ambulatory Visit (INDEPENDENT_AMBULATORY_CARE_PROVIDER_SITE_OTHER): Payer: Commercial Managed Care - HMO | Admitting: Physician Assistant

## 2015-01-05 ENCOUNTER — Encounter: Payer: Self-pay | Admitting: Physician Assistant

## 2015-01-05 VITALS — BP 136/78 | HR 69 | Temp 97.5°F | Resp 16 | Ht 69.0 in | Wt 158.2 lb

## 2015-01-05 DIAGNOSIS — Z021 Encounter for pre-employment examination: Secondary | ICD-10-CM

## 2015-01-05 NOTE — Progress Notes (Signed)
Commercial Driver Medical Examination   Wayne Rodriguez is a 69 y.o. male who presents today for a commercial driver fitness determination physical exam. The patient reports no problems. The following portions of the patient's history were reviewed and updated as appropriate: allergies, current medications, past family history, past medical history, past social history, past surgical history and problem list. He has a PMH of HTN - well controlled on meds. Review of Systems A comprehensive review of systems was negative.   Objective:    Vision/hearing: Vision checked at ophthalmology appointment in march and passes with corrected lenses.  Hearing Screening Comments: Both ears 73ft whisper test  Applicant can recognize and distinguish among traffic control signals and devices showing standard red, green, and amber colors.  Applicant meets visual acuity requirement only when wearing corrective lenses.  Monocular Vision?: No  BP 136/78 mmHg  Pulse 69  Temp(Src) 97.5 F (36.4 C) (Oral)  Resp 16  Ht 5\' 9"  (1.753 m)  Wt 158 lb 3.2 oz (71.759 kg)  BMI 23.35 kg/m2  SpO2 100%  General Appearance:    Alert, cooperative, no distress, appears stated age  Head:    Normocephalic, without obvious abnormality, atraumatic  Eyes:    PERRL, conjunctiva/corneas clear, EOM's intact, fundi    benign, both eyes       Ears:    Normal TM's and external ear canals, both ears  Nose:   Nares normal, septum midline, mucosa normal, no drainage    or sinus tenderness  Throat:   Lips, mucosa, and tongue normal; teeth and gums normal  Neck:   Supple, symmetrical, trachea midline, no adenopathy;       thyroid:  No enlargement/tenderness/nodules; no carotid   bruit or JVD  Back:     Symmetric, no curvature, ROM normal, no CVA tenderness  Lungs:     Clear to auscultation bilaterally, respirations unlabored  Chest wall:    No tenderness or deformity  Heart:    Regular rate and rhythm, S1 and S2 normal, no  murmur, rub   or gallop  Abdomen:     Soft, non-tender, bowel sounds active all four quadrants,    no masses, no organomegaly, no inguinal hernias        Extremities:   Extremities normal, atraumatic, no cyanosis or edema  Pulses:   2+ and symmetric all extremities  Skin:   Skin color, texture, turgor normal, no rashes or lesions  Lymph nodes:   Cervical, supraclavicular, and axillary nodes normal  Neurologic:   CNII-XII intact. Normal strength, sensation and reflexes      throughout    Labs:   spec gravity 1.015 glucose negative, protein negative, blood trace  Assessment:    Healthy male exam.  Meets standards, but periodic monitoring required due to HTN.  Driver qualified only for 1 year.    Plan:    Medical examiners certificate completed and printed. Return as needed.  Return in 1 year for recert.

## 2015-03-06 ENCOUNTER — Telehealth: Payer: Self-pay | Admitting: Cardiology

## 2015-03-06 DIAGNOSIS — R0602 Shortness of breath: Secondary | ICD-10-CM

## 2015-03-06 NOTE — Telephone Encounter (Signed)
Patient complained of SOB, rapid heart beat and extreme weakness, no pain but soreness in arms, shoulders, and some in chest. Currently BP 152/71 HR 76. This is the 4 th time this month patient has had this keep of episode.  Patient states that it started this morning when taking out the trash. Patient had rapid heartbeat and become so weak that he just feels like he could collapse. Patient took two ibuprofen yesterday and felt better. Patient has taken his morning medications and took two ibuprofen about 30 minutes ago. This has not helped so far. Patient states his heartbeat has slowed down, but still feels week and SOB.  Patient has been doing a lot of yard work lately. Patient also included that he has not been taking his ASA for three weeks, but started back a couple of days ago. Will consult FLEX and DOD.  Wayne Merle NP (FLEX) order for patient to have an event monitor and to keep his appointment with Wayne Rodriguez on 5/12. Patient verbalized understanding, but was still concerned. Encouraged the patient to go to urgent care or ED if symptoms become worse. Will forward to Wayne Rodriguez and to [facility] to schedule event monitor.

## 2015-03-06 NOTE — Telephone Encounter (Signed)
Pt c/o Shortness Of Breath: STAT if SOB developed within the last 24 hours or pt is noticeably SOB on the phone  1. Are you currently SOB (can you hear that pt is SOB on the phone)? Not currently (Not on the phone)  2. How long have you been experiencing SOB? With in 24 hours 3. Are you SOB when sitting or when up moving around? Both 4.  Are you currently experiencing any other symptoms? Not cu  Comments: Pt called states that he has had trouble with his heart for the past 3 weeks, and its getting worse. He started getting weak when he took the trash out. He gets really weak. Pt states that cant even take a shower without having to lay down (He wants to take a shower now but will not because he is afraid. When this happens he has a soreness (tightness in the left shoulder) Almost like he's lifted weights, It not pain but a soreness. Feels week as if he is about to pass out and his heart races profusely. Please call back to discuss.

## 2015-03-16 ENCOUNTER — Ambulatory Visit (INDEPENDENT_AMBULATORY_CARE_PROVIDER_SITE_OTHER): Payer: Commercial Managed Care - HMO | Admitting: Cardiology

## 2015-03-16 ENCOUNTER — Encounter: Payer: Self-pay | Admitting: Cardiology

## 2015-03-16 ENCOUNTER — Other Ambulatory Visit: Payer: Self-pay

## 2015-03-16 VITALS — BP 120/66 | HR 66 | Ht 69.0 in | Wt 156.9 lb

## 2015-03-16 DIAGNOSIS — R002 Palpitations: Secondary | ICD-10-CM

## 2015-03-16 DIAGNOSIS — I10 Essential (primary) hypertension: Secondary | ICD-10-CM

## 2015-03-16 DIAGNOSIS — R011 Cardiac murmur, unspecified: Secondary | ICD-10-CM | POA: Diagnosis not present

## 2015-03-16 DIAGNOSIS — I77810 Thoracic aortic ectasia: Secondary | ICD-10-CM | POA: Diagnosis not present

## 2015-03-16 MED ORDER — LISINOPRIL-HYDROCHLOROTHIAZIDE 20-12.5 MG PO TABS: 2.0000 | ORAL_TABLET | Freq: Every day | ORAL | Status: DC

## 2015-03-16 MED ORDER — NITROGLYCERIN 0.4 MG SL SUBL: 0.4000 mg | SUBLINGUAL_TABLET | SUBLINGUAL | Status: DC | PRN

## 2015-03-16 NOTE — Progress Notes (Signed)
Wayne Rodriguez Date of Birth: 1945/11/26 Medical Record #[id number]  History of Present Illness: Wayne Rodriguez is seen for evaluation of heart pounding. He had a cardiac cath 5 years ago due to complaints of palpitations. This showed minor nonobstructive disease. Negative event monitor back last April of 2013. Other issues include HTN. He has a history of mildly dilated aortic root. No change by CT from 2011 to 2015.  On follow up today he reports a 2 week history of heart pounding with exertion. No skipping, racing or dizziness. It just pounds very hard and is resolved with rest. This has occurred repeatedly with yard work, walking up hill, or putting out trash. Very concerning to him.    Current Outpatient Prescriptions  Medication Sig Dispense Refill  . aspirin 81 MG tablet Take 81 mg by mouth daily.      . Cholecalciferol (VITAMIN D-3 PO) Take 1 tablet by mouth daily.     . Cyanocobalamin (VITAMIN B-12 PO) Take 1 tablet by mouth daily.     . Flaxseed, Linseed, (FLAX SEED OIL PO) Take 1 tablet by mouth daily.     Marland Kitchen lisinopril-hydrochlorothiazide (PRINZIDE,ZESTORETIC) 20-12.5 MG per tablet Take 2 tablets by mouth daily. 60 tablet 0  . nitroGLYCERIN (NITROSTAT) 0.4 MG SL tablet Place 1 tablet (0.4 mg total) under the tongue every 5 (five) minutes as needed. 25 tablet 11  . Probiotic Product (PROBIOTIC DAILY PO) Take 1 capsule by mouth daily.      No current facility-administered medications for this visit.    Allergies  Allergen Reactions  . Beta Adrenergic Blockers     Bradycardia   . Codeine     REACTION: nausea/vomiting    Past Medical History  Diagnosis Date  . Chest pain     a. 02/2010 Cath: normal  . HTN (hypertension)   . GERD (gastroesophageal reflux disease)   . Back pain   . Aortic root dilatation     a. 09/2010 CT: 4.1cm Max Asc Ao diam, 3.7cm Max Desc Ao diam;; s/p MRI/MRA in April 2013  . Decreased exercise tolerance     a. 02/2012  . Palpitations     a. h/o  palps r/t caffeine - has occurred again 02/2012  . Pulmonary nodule   . Vertigo     hx of  . Cancer     skin cancer removed from face    Past Surgical History  Procedure Laterality Date  . Back surgery  1983  . Eye surgery  1997    remove foreign body; right  . Colonoscopy w/ polypectomy    . Cardiac catheterization      no PCI  . Video assisted thoracoscopy (vats)/wedge resection Right 03/17/2014    Procedure: VIDEO ASSISTED THORACOSCOPY (VATS)/WEDGE RESECTION;  Surgeon: Melrose Nakayama, MD;  Location: [facility];  Service: Thoracic;  Laterality: Right;    History  Smoking status  . Former Smoker -- 1.00 packs/day for 30 years  . Types: Cigarettes  . Start date: 03/08/1962  . Quit date: 03/08/1992  Smokeless tobacco  . Former Systems developer  . Quit date: 01/06/2013    History  Alcohol Use No    Family History  Problem Relation Age of Onset  . Lung cancer Mother   . Colon cancer Sister 6    Review of Systems: The review of systems is per the HPI.  All other systems were reviewed and are negative.  Physical Exam: BP 120/66 mmHg  Pulse 66  Ht 5'  9" (1.753 m)  Wt 156 lb 14.4 oz (71.169 kg)  BMI 23.16 kg/m2 Patient is very pleasant and in no acute distress. He is  thin.  Skin is warm and dry. Color is normal.  HEENT is unremarkable. Normocephalic/atraumatic. PERRL. Sclera are nonicteric. Neck is supple. No masses. No JVD. Lungs are clear. Cardiac exam shows a regular rate and rhythm. There is a grade 2/6 harsh mid systolic murmur with late peaking heard at the apex and left axilla.  Abdomen is soft. Extremities are without edema. Gait and ROM are intact. No gross neurologic deficits noted.  Wt Readings from Last 3 Encounters:  03/16/15 156 lb 14.4 oz (71.169 kg)  01/05/15 158 lb 3.2 oz (71.759 kg)  05/14/14 150 lb 8 oz (68.266 kg)     LABORATORY DATA:    Lab Results  Component Value Date   WBC 6.3 03/19/2014   HGB 12.3* 03/19/2014   HCT 37.4* 03/19/2014   PLT 131*  03/19/2014   GLUCOSE 120* 03/19/2014   CHOL 151 10/19/2012   TRIG 114.0 10/19/2012   HDL 43.60 10/19/2012   LDLCALC 85 10/19/2012   ALT 9 03/19/2014   AST 17 03/19/2014   NA 140 03/19/2014   K 3.9 03/19/2014   CL 102 03/19/2014   CREATININE 0.78 03/19/2014   BUN 9 03/19/2014   CO2 27 03/19/2014   TSH 1.50 02/17/2012   INR 1.11 03/15/2014   HGBA1C  02/24/2010    5.1 (NOTE)                                                                       According to the ADA Clinical Practice Recommendations for 2011, when HbA1c is used as a screening test:   >=6.5%   Diagnostic of Diabetes Mellitus           (if abnormal result  is confirmed)  5.7-6.4%   Increased risk of developing Diabetes Mellitus  References:Diagnosis and Classification of Diabetes Mellitus,Diabetes TKWI,0973,53(GDJME 1):S62-S69 and Standards of Medical Care in         Diabetes - 2011,Diabetes Care,2011,34  (Suppl 1):S11-S61.   Ecg today shows NSR with first degree AV block. Otherwise normal. I have personally reviewed and interpreted this study.   Assessment / Plan:  1. Heart pounding with exertion. Need to evaluate for possible arrhythmia versus angina. Will schedule stress test.    2. Heart murmur. I reviewed all prior notes. This has never been noted before. Sounds like MV prolapse to me. Echo in 2013 showed mild MR, AI without prolapse. Will update Echo now.    3. Enlarged aortic root - Minimal enlargement by CT 4.1 cm. Will assess with Echo. I doubt this will ever cause a problem given stability over the past 5 years.   4. Lung nodules benign  5. Palpitations. Resolved with cessation of caffeine in the past.

## 2015-03-16 NOTE — Patient Instructions (Signed)
We will schedule you for an Echocardiogram and a stress test.

## 2015-04-06 ENCOUNTER — Other Ambulatory Visit: Payer: Self-pay | Admitting: Thoracic Surgery (Cardiothoracic Vascular Surgery)

## 2015-04-06 DIAGNOSIS — D1430 Benign neoplasm of unspecified bronchus and lung: Secondary | ICD-10-CM

## 2015-04-07 ENCOUNTER — Telehealth (HOSPITAL_COMMUNITY): Payer: Self-pay

## 2015-04-07 NOTE — Telephone Encounter (Signed)
Encounter complete. 

## 2015-04-12 ENCOUNTER — Ambulatory Visit (HOSPITAL_BASED_OUTPATIENT_CLINIC_OR_DEPARTMENT_OTHER)
Admission: RE | Admit: 2015-04-12 | Discharge: 2015-04-12 | Disposition: A | Discharge status: Home / Self Care | Payer: Commercial Managed Care - HMO | Source: Ambulatory Visit | Attending: Cardiology | Admitting: Cardiology

## 2015-04-12 ENCOUNTER — Ambulatory Visit (HOSPITAL_COMMUNITY)
Admission: RE | Admit: 2015-04-12 | Discharge: 2015-04-12 | Disposition: A | Discharge status: Home / Self Care | Payer: Commercial Managed Care - HMO | Source: Ambulatory Visit | Attending: Cardiovascular Disease | Admitting: Cardiovascular Disease

## 2015-04-12 DIAGNOSIS — R011 Cardiac murmur, unspecified: Secondary | ICD-10-CM

## 2015-04-12 DIAGNOSIS — Z87891 Personal history of nicotine dependence: Secondary | ICD-10-CM | POA: Diagnosis not present

## 2015-04-12 DIAGNOSIS — R002 Palpitations: Secondary | ICD-10-CM | POA: Diagnosis not present

## 2015-04-12 DIAGNOSIS — I080 Rheumatic disorders of both mitral and aortic valves: Secondary | ICD-10-CM | POA: Diagnosis not present

## 2015-04-12 DIAGNOSIS — I77810 Thoracic aortic ectasia: Secondary | ICD-10-CM

## 2015-04-12 DIAGNOSIS — I10 Essential (primary) hypertension: Secondary | ICD-10-CM

## 2015-04-12 LAB — EXERCISE TOLERANCE TEST
CHL CUP RESTING HR STRESS: 63 {beats}/min
CSEPED: 7 min
CSEPEW: 8.5 METS
CSEPPHR: 146 {beats}/min
MPHR: 152 {beats}/min
Percent HR: 96 %
RPE: 25155

## 2015-04-25 ENCOUNTER — Ambulatory Visit
Admission: RE | Admit: 2015-04-25 | Discharge: 2015-04-25 | Disposition: A | Discharge status: Home / Self Care | Payer: Commercial Managed Care - HMO | Source: Ambulatory Visit | Attending: Thoracic Surgery (Cardiothoracic Vascular Surgery) | Admitting: Thoracic Surgery (Cardiothoracic Vascular Surgery)

## 2015-04-25 ENCOUNTER — Encounter: Payer: Self-pay | Admitting: Thoracic Surgery (Cardiothoracic Vascular Surgery)

## 2015-04-25 ENCOUNTER — Ambulatory Visit (INDEPENDENT_AMBULATORY_CARE_PROVIDER_SITE_OTHER): Payer: Commercial Managed Care - HMO | Admitting: Thoracic Surgery (Cardiothoracic Vascular Surgery)

## 2015-04-25 VITALS — BP 123/65 | HR 63 | Resp 16 | Ht 69.0 in | Wt 156.0 lb

## 2015-04-25 DIAGNOSIS — D1431 Benign neoplasm of right bronchus and lung: Secondary | ICD-10-CM | POA: Diagnosis not present

## 2015-04-25 DIAGNOSIS — D1430 Benign neoplasm of unspecified bronchus and lung: Secondary | ICD-10-CM

## 2015-04-25 DIAGNOSIS — R918 Other nonspecific abnormal finding of lung field: Secondary | ICD-10-CM

## 2015-04-25 NOTE — Progress Notes (Signed)
[address]       [place] [address]             [phone]       HPI:  Wayne Rodriguez returns today for a scheduled 1 year follow-up.  He is a 69 year old man who had a thoracoscopic wedge resection of hypermetabolic nodules in his right upper lobe last year. He did well postoperatively.  I recommended a one-year follow-up scan to rule out any additional inflammatory nodules and also to follow-up his ascending aortic dilatation.  Says that over the past year he has been feeling well. He remains relatively active. He quit smoking back in 1993. His appetite is good. His weight is stable. He is not having any chest pain or shortness of breath.  Past Medical History  Diagnosis Date  . Chest pain     a. 02/2010 Cath: normal  . HTN (hypertension)   . GERD (gastroesophageal reflux disease)   . Back pain   . Aortic root dilatation     a. 09/2010 CT: 4.1cm Max Asc Ao diam, 3.7cm Max Desc Ao diam;; s/p MRI/MRA in April 2013  . Decreased exercise tolerance     a. 02/2012  . Palpitations     a. h/o palps r/t caffeine - has occurred again 02/2012  . Pulmonary nodule   . Vertigo     hx of  . Cancer     skin cancer removed from face    Current Outpatient Prescriptions  Medication Sig Dispense Refill  . aspirin 81 MG tablet Take 81 mg by mouth daily.      . Cholecalciferol (VITAMIN D-3 PO) Take 1,000 mg by mouth daily.     . Cyanocobalamin (VITAMIN B-12 PO) Take 1 tablet by mouth daily.     . Flaxseed, Linseed, (FLAX SEED OIL PO) Take 1 tablet by mouth daily.     Marland Kitchen lisinopril-hydrochlorothiazide (PRINZIDE,ZESTORETIC) 20-12.5 MG per tablet Take 2 tablets by mouth daily. 180 tablet 3  . nitroGLYCERIN (NITROSTAT) 0.4 MG SL tablet Place 1 tablet (0.4 mg total) under the tongue every 5 (five) minutes as needed. 25 tablet 11  . Probiotic Product (PROBIOTIC DAILY PO) Take 1 capsule by mouth daily.      No current facility-administered medications for this visit.     Physical Exam BP 123/65 mmHg  Pulse 63  Resp 16  Ht 5\' 9"  (1.753 m)  Wt 156 lb (70.761 kg)  BMI 23.03 kg/m2  SpO26 3% 69 year old man in no acute distress Alert and oriented 3 with no focal deficits No cervical or subclavicular adenopathy Cardiac regular rate and rhythm normal S1 and S2, 2/6 systolic murmur at left lower sternal border Lungs clear with equal breath sounds bilaterally  Diagnostic Tests: CT CHEST WITHOUT CONTRAST  TECHNIQUE: Multidetector CT imaging of the chest was performed following the standard protocol without IV contrast.  COMPARISON: PET-CT 03/07/2014. Chest CT 02/21/2014.  FINDINGS: Mediastinum/Lymph Nodes: Heart size is normal. There is no significant pericardial fluid, thickening or pericardial calcification. There is atherosclerosis of the thoracic aorta, the great vessels of the mediastinum and the coronary arteries, including calcified atherosclerotic plaque in the left main, left anterior descending and right coronary arteries. In addition, there is ectasia of the ascending thoracic aorta which measures up to 4.3 cm in diameter. No pathologically enlarged mediastinal or hilar lymph nodes. Please note that accurate exclusion of hilar adenopathy is limited on noncontrast CT scans. Several densely calcified mediastinal lymph nodes  are noted. Esophagus is unremarkable in appearance. No axillary lymphadenopathy.  Lungs/Pleura: Postoperative changes of wedge resection in the right upper lobe are noted, with a new area of architectural distortion. No suspicious mass or nodularity in this region to suggest local recurrence of the resected lesion. No other suspicious appearing pulmonary nodules or masses are noted elsewhere in the lungs. No acute consolidative airspace disease. No pleural effusions.  Upper Abdomen: Numerous calcified granulomas in the spleen.  Musculoskeletal/Soft Tissues: There are no aggressive appearing lytic or  blastic lesions noted in the visualized portions of the skeleton.  IMPRESSION: 1. Status post wedge resection in the right upper lobe with some mild postoperative scarring. No new pulmonary nodules or masses are noted. 2. Atherosclerosis, including left main and 2 vessel coronary artery disease. Please note that although the presence of coronary artery calcium documents the presence of coronary artery disease, the severity of this disease and any potential stenosis cannot be assessed on this non-gated CT examination. Assessment for potential risk factor modification, dietary therapy or pharmacologic therapy may be warranted, if clinically indicated. 3. In addition, there is mild ectasia of the ascending thoracic aorta (4.3 cm in diameter). Recommend annual imaging followup by CTA or MRA. This recommendation follows 2010 ACCF/AHA/AATS/ACR/ASA/SCA/SCAI/SIR/STS/SVM Guidelines for the Diagnosis and Management of Patients with Thoracic Aortic Disease. Circulation. 2010; 121: Y195-K932. 4. Old granulomatous disease, as above.   Electronically Signed  By: Vinnie Langton M.D.  On: 04/25/2015 12:06   Impression: 69 year old man with a ascending aortic ectasia. The aorta is 4.3 cm at the aortic root. This has not changed significantly in the past year. His blood pressure is good and he is doing well clinically. There is no indication for surgery at the present time.  There are no new lung nodules since last year.  Plan:  Return in one year with CT angiogram of chest follow-up ascending aorta.  Wayne Nakayama, MD [facility] [phone]  I spent 10 minutes face-to-face with Wayne Rodriguez during this visit

## 2015-07-17 ENCOUNTER — Ambulatory Visit (INDEPENDENT_AMBULATORY_CARE_PROVIDER_SITE_OTHER): Payer: Commercial Managed Care - HMO | Admitting: Cardiology

## 2015-07-17 ENCOUNTER — Encounter: Payer: Self-pay | Admitting: Cardiology

## 2015-07-17 VITALS — BP 138/60 | HR 56 | Ht 69.0 in | Wt 156.0 lb

## 2015-07-17 DIAGNOSIS — I341 Nonrheumatic mitral (valve) prolapse: Secondary | ICD-10-CM | POA: Diagnosis not present

## 2015-07-17 DIAGNOSIS — I77810 Thoracic aortic ectasia: Secondary | ICD-10-CM | POA: Diagnosis not present

## 2015-07-17 DIAGNOSIS — I493 Ventricular premature depolarization: Secondary | ICD-10-CM | POA: Diagnosis not present

## 2015-07-17 HISTORY — DX: Ventricular premature depolarization: I49.3

## 2015-07-17 NOTE — Progress Notes (Signed)
Wayne Rodriguez Date of Birth: [birth year] Medical Record #[id number]  History of Present Illness: Wayne Rodriguez is seen for follow up PVCs. He had a cardiac cath 5 years ago due to complaints of palpitations. This showed minor nonobstructive disease. Negative event monitor back last April of 2013. Other issues include HTN. He has a history of mildly dilated aortic root. No change by CT from 2011 to 2015. He was seen in May with complaints of palpitations with exertion or stress. ETT was done which demonstrated PVCs with exertion but was negative for ischemia. Echo showed normal EF with MV prolapse and mild to moderate MR. On follow up today his symptoms are unchanged.  No  racing or dizziness. It just pounds very hard and is resolved with deep breathing.    Current Outpatient Prescriptions  Medication Sig Dispense Refill  . aspirin 81 MG tablet Take 81 mg by mouth daily.      . Cholecalciferol (VITAMIN D-3 PO) Take 1,000 mg by mouth daily.     . Cyanocobalamin (VITAMIN B-12 PO) Take 1 tablet by mouth daily.     . Flaxseed, Linseed, (FLAX SEED OIL PO) Take 1 tablet by mouth daily.     Marland Kitchen lisinopril-hydrochlorothiazide (PRINZIDE,ZESTORETIC) 20-12.5 MG per tablet Take 2 tablets by mouth daily. 180 tablet 3  . nitroGLYCERIN (NITROSTAT) 0.4 MG SL tablet Place 1 tablet (0.4 mg total) under the tongue every 5 (five) minutes as needed. 25 tablet 11  . Probiotic Product (PROBIOTIC DAILY PO) Take 1 capsule by mouth daily.      No current facility-administered medications for this visit.    Allergies  Allergen Reactions  . Beta Adrenergic Blockers     Bradycardia   . Codeine     REACTION: nausea/vomiting    Past Medical History  Diagnosis Date  . Chest pain     a. 02/2010 Cath: normal  . HTN (hypertension)   . GERD (gastroesophageal reflux disease)   . Back pain   . Aortic root dilatation     a. 09/2010 CT: 4.1cm Max Asc Ao diam, 3.7cm Max Desc Ao diam;; s/p MRI/MRA in April 2013  .  Decreased exercise tolerance     a. 02/2012  . Palpitations     a. h/o palps r/t caffeine - has occurred again 02/2012  . Pulmonary nodule   . Vertigo     hx of  . Cancer     skin cancer removed from face  . PVC's (premature ventricular contractions) 07/17/2015  . Mitral valve prolapse     Past Surgical History  Procedure Laterality Date  . Back surgery  1983  . Eye surgery  1997    remove foreign body; right  . Colonoscopy w/ polypectomy    . Cardiac catheterization      no PCI  . Video assisted thoracoscopy (vats)/wedge resection Right 03/17/2014    Procedure: VIDEO ASSISTED THORACOSCOPY (VATS)/WEDGE RESECTION;  Surgeon: Melrose Nakayama, MD;  Location: [facility];  Service: Thoracic;  Laterality: Right;    History  Smoking status  . Former Smoker -- 1.00 packs/day for 30 years  . Types: Cigarettes  . Start date: 03/08/1962  . Quit date: 03/08/1992  Smokeless tobacco  . Former Systems developer  . Quit date: 01/06/2013    History  Alcohol Use No    Family History  Problem Relation Age of Onset  . Lung cancer Mother   . Colon cancer Sister 71    Review of Systems: The review of  systems is per the HPI.  All other systems were reviewed and are negative.  Physical Exam: BP 138/60 mmHg  Pulse 56  Ht 5\' 9"  (1.753 m)  Wt 70.761 kg (156 lb)  BMI 23.03 kg/m2 Patient is very pleasant and in no acute distress. He is  thin.  Skin is warm and dry. Color is normal.  HEENT is unremarkable. Normocephalic/atraumatic. PERRL. Sclera are nonicteric. Neck is supple. No masses. No JVD. Lungs are clear. Cardiac exam shows a regular rate and rhythm. There is a grade 2/6 harsh mid systolic murmur with late peaking heard at the apex and left axilla.  Abdomen is soft. Extremities are without edema. Gait and ROM are intact. No gross neurologic deficits noted.  Wt Readings from Last 3 Encounters:  07/17/15 70.761 kg (156 lb)  04/25/15 70.761 kg (156 lb)  03/16/15 71.169 kg (156 lb 14.4 oz)      LABORATORY DATA:    Lab Results  Component Value Date   WBC 6.3 03/19/2014   HGB 12.3* 03/19/2014   HCT 37.4* 03/19/2014   PLT 131* 03/19/2014   GLUCOSE 120* 03/19/2014   CHOL 151 10/19/2012   TRIG 114.0 10/19/2012   HDL 43.60 10/19/2012   LDLCALC 85 10/19/2012   ALT 9 03/19/2014   AST 17 03/19/2014   NA 140 03/19/2014   K 3.9 03/19/2014   CL 102 03/19/2014   CREATININE 0.78 03/19/2014   BUN 9 03/19/2014   CO2 27 03/19/2014   TSH 1.50 02/17/2012   INR 1.11 03/15/2014   HGBA1C  02/24/2010    5.1 (NOTE)                                                                       According to the ADA Clinical Practice Recommendations for 2011, when HbA1c is used as a screening test:   >=6.5%   Diagnostic of Diabetes Mellitus           (if abnormal result  is confirmed)  5.7-6.4%   Increased risk of developing Diabetes Mellitus  References:Diagnosis and Classification of Diabetes Mellitus,Diabetes GHWE,9937,16(RCVEL 1):S62-S69 and Standards of Medical Care in         Diabetes - 2011,Diabetes Care,2011,34  (Suppl 1):S11-S61.   ETT  Study Highlights     There was no ST segment deviation noted during stress.  Blood pressure demonstrated a hypertensive response to exercise.  Negative adequate GXT with the patient exercising to a peak HR 146 (96% APMHR) and workload of 8.5 mets. No chest pain or ECG changes of ischemia. Patient had an exaggerated BP response to exercise and occasional isolated PVC's during Stage 2 of exercise.    Echo: 04/12/15:Study Conclusions  - Left ventricle: The cavity size was normal. Systolic function was vigorous. The estimated ejection fraction was in the range of 65% to 70%. Wall motion was normal; there were no regional wall motion abnormalities. Left ventricular diastolic function parameters were normal. - Aortic valve: There was moderate regurgitation. - Mitral valve: There is a prolapse of the posterior mitral valve leaflet associated  with anteriorly directed jet of mitral regurgitation. There was mild to moderate regurgitation directed anteriorly. - Right ventricle: Systolic function was normal. - Right atrium: The atrium was normal in size. -  Tricuspid valve: There was trivial regurgitation. - Pulmonary arteries: Systolic pressure was within the normal range. - Inferior vena cava: The vessel was normal in size. The respirophasic diameter changes were in the normal range (= 50%), consistent with normal central venous pressure. - Pericardium, extracardiac: There was no pericardial effusion.  Assessment / Plan:  1. PVCs. Symptoms correlate with these noted on ETT. PVCs are benign based on normal EF and no ischemia. Discussed taking a beta blocker to reduce his symptoms but at this time he doesn't feel his symptoms are bad enough to take medication. If his symptoms worsen would consider a beta blocker.    2. MV prolapse- posterior leaflet- mild to moderate. Asymptomatic.   3. Enlarged aortic root - Minimal enlargement by CT 4.1 cm. Followed by Dr. Roxan Hockey.  4. Lung nodules benign  I will follow up in one year.

## 2015-07-17 NOTE — Patient Instructions (Signed)
Continue your current therapy  I will see you in one year   

## 2015-09-13 ENCOUNTER — Ambulatory Visit (INDEPENDENT_AMBULATORY_CARE_PROVIDER_SITE_OTHER): Payer: Worker's Compensation | Admitting: Family Medicine

## 2015-09-13 VITALS — BP 132/60 | HR 61 | Temp 98.1°F | Resp 20 | Ht 69.0 in | Wt 158.0 lb

## 2015-09-13 DIAGNOSIS — S161XXA Strain of muscle, fascia and tendon at neck level, initial encounter: Secondary | ICD-10-CM
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
## 2015-09-13 NOTE — Patient Instructions (Signed)
Cervical Strain and Sprain With Rehab Cervical strain and sprain are injuries that commonly occur with "whiplash" injuries. Whiplash occurs when the neck is forcefully whipped backward or forward, such as during a motor vehicle accident or during contact sports. The muscles, ligaments, tendons, discs, and nerves of the neck are susceptible to injury when this occurs. RISK FACTORS Risk of having a whiplash injury increases if:  Osteoarthritis of the spine.  Situations that make head or neck accidents or trauma more likely.  High-risk sports (football, rugby, wrestling, hockey, auto racing, gymnastics, diving, contact karate, or boxing).  Poor strength and flexibility of the neck.  Previous neck injury.  Poor tackling technique.  Improperly fitted or padded equipment. SYMPTOMS   Pain or stiffness in the front or back of neck or both.  Symptoms may present immediately or up to 24 hours after injury.  Dizziness, headache, nausea, and vomiting.  Muscle spasm with soreness and stiffness in the neck.  Tenderness and swelling at the injury site. PREVENTION  Learn and use proper technique (avoid tackling with the head, spearing, and head-butting; use proper falling techniques to avoid landing on the head).  Warm up and stretch properly before activity.  Maintain physical fitness:  Strength, flexibility, and endurance.  Cardiovascular fitness.  Wear properly fitted and padded protective equipment, such as padded soft collars, for participation in contact sports. PROGNOSIS  Recovery from cervical strain and sprain injuries is dependent on the extent of the injury. These injuries are usually curable in 1 week to 3 months with appropriate treatment.  RELATED COMPLICATIONS   Temporary numbness and weakness may occur if the nerve roots are damaged, and this may persist until the nerve has completely healed.  Chronic pain due to frequent recurrence of symptoms.  Prolonged healing,  especially if activity is resumed too soon (before complete recovery). TREATMENT  Treatment initially involves the use of ice and medication to help reduce pain and inflammation. It is also important to perform strengthening and stretching exercises and modify activities that worsen symptoms so the injury does not get worse. These exercises may be performed at home or with a therapist. For patients who experience severe symptoms, a soft, padded collar may be recommended to be worn around the neck.  Improving your posture may help reduce symptoms. Posture improvement includes pulling your chin and abdomen in while sitting or standing. If you are sitting, sit in a firm chair with your buttocks against the back of the chair. While sleeping, try replacing your pillow with a small towel rolled to 2 inches in diameter, or use a cervical pillow or soft cervical collar. Poor sleeping positions delay healing.  For patients with nerve root damage, which causes numbness or weakness, the use of a cervical traction apparatus may be recommended. Surgery is rarely necessary for these injuries. However, cervical strain and sprains that are present at birth (congenital) may require surgery. MEDICATION   If pain medication is necessary, nonsteroidal anti-inflammatory medications, such as aspirin and ibuprofen, or other minor pain relievers, such as acetaminophen, are often recommended.  Do not take pain medication for 7 days before surgery.  Prescription pain relievers may be given if deemed necessary by your caregiver. Use only as directed and only as much as you need. HEAT AND COLD:   Cold treatment (icing) relieves pain and reduces inflammation. Cold treatment should be applied for 10 to 15 minutes every 2 to 3 hours for inflammation and pain and immediately after any activity that aggravates your  symptoms. Use ice packs or an ice massage.  Heat treatment may be used prior to performing the stretching and  strengthening activities prescribed by your caregiver, physical therapist, or athletic trainer. Use a heat pack or a warm soak. SEEK MEDICAL CARE IF:   Symptoms get worse or do not improve in 2 weeks despite treatment.  New, unexplained symptoms develop (drugs used in treatment may produce side effects). EXERCISES RANGE OF MOTION (ROM) AND STRETCHING EXERCISES - Cervical Strain and Sprain These exercises may help you when beginning to rehabilitate your injury. In order to successfully resolve your symptoms, you must improve your posture. These exercises are designed to help reduce the forward-head and rounded-shoulder posture which contributes to this condition. Your symptoms may resolve with or without further involvement from your physician, physical therapist or athletic trainer. While completing these exercises, remember:   Restoring tissue flexibility helps normal motion to return to the joints. This allows healthier, less painful movement and activity.  An effective stretch should be held for at least 20 seconds, although you may need to begin with shorter hold times for comfort.  A stretch should never be painful. You should only feel a gentle lengthening or release in the stretched tissue. STRETCH- Axial Extensors  Lie on your back on the floor. You may bend your knees for comfort. Place a rolled-up hand towel or dish towel, about 2 inches in diameter, under the part of your head that makes contact with the floor.  Gently tuck your chin, as if trying to make a "double chin," until you feel a gentle stretch at the base of your head.  Hold __________ seconds. Repeat __________ times. Complete this exercise __________ times per day.  STRETCH - Axial Extension   Stand or sit on a firm surface. Assume a good posture: chest up, shoulders drawn back, abdominal muscles slightly tense, knees unlocked (if standing) and feet hip width apart.  Slowly retract your chin so your head slides back  and your chin slightly lowers. Continue to look straight ahead.  You should feel a gentle stretch in the back of your head. Be certain not to feel an aggressive stretch since this can cause headaches later.  Hold for __________ seconds. Repeat __________ times. Complete this exercise __________ times per day. STRETCH - Cervical Side Bend   Stand or sit on a firm surface. Assume a good posture: chest up, shoulders drawn back, abdominal muscles slightly tense, knees unlocked (if standing) and feet hip width apart.  Without letting your nose or shoulders move, slowly tip your right / left ear to your shoulder until your feel a gentle stretch in the muscles on the opposite side of your neck.  Hold __________ seconds. Repeat __________ times. Complete this exercise __________ times per day. STRETCH - Cervical Rotators   Stand or sit on a firm surface. Assume a good posture: chest up, shoulders drawn back, abdominal muscles slightly tense, knees unlocked (if standing) and feet hip width apart.  Keeping your eyes level with the ground, slowly turn your head until you feel a gentle stretch along the back and opposite side of your neck.  Hold __________ seconds. Repeat __________ times. Complete this exercise __________ times per day. RANGE OF MOTION - Neck Circles   Stand or sit on a firm surface. Assume a good posture: chest up, shoulders drawn back, abdominal muscles slightly tense, knees unlocked (if standing) and feet hip width apart.  Gently roll your head down and around from the back  of one shoulder to the back of the other. The motion should never be forced or painful.  Repeat the motion 10-20 times, or until you feel the neck muscles relax and loosen. Repeat __________ times. Complete the exercise __________ times per day. STRENGTHENING EXERCISES - Cervical Strain and Sprain These exercises may help you when beginning to rehabilitate your injury. They may resolve your symptoms with or  without further involvement from your physician, physical therapist, or athletic trainer. While completing these exercises, remember:   Muscles can gain both the endurance and the strength needed for everyday activities through controlled exercises.  Complete these exercises as instructed by your physician, physical therapist, or athletic trainer. Progress the resistance and repetitions only as guided.  You may experience muscle soreness or fatigue, but the pain or discomfort you are trying to eliminate should never worsen during these exercises. If this pain does worsen, stop and make certain you are following the directions exactly. If the pain is still present after adjustments, discontinue the exercise until you can discuss the trouble with your clinician. STRENGTH - Cervical Flexors, Isometric  Face a wall, standing about 6 inches away. Place a small pillow, a ball about 6-8 inches in diameter, or a folded towel between your forehead and the wall.  Slightly tuck your chin and gently push your forehead into the soft object. Push only with mild to moderate intensity, building up tension gradually. Keep your jaw and forehead relaxed.  Hold 10 to 20 seconds. Keep your breathing relaxed.  Release the tension slowly. Relax your neck muscles completely before you start the next repetition. Repeat __________ times. Complete this exercise __________ times per day. STRENGTH- Cervical Lateral Flexors, Isometric   Stand about 6 inches away from a wall. Place a small pillow, a ball about 6-8 inches in diameter, or a folded towel between the side of your head and the wall.  Slightly tuck your chin and gently tilt your head into the soft object. Push only with mild to moderate intensity, building up tension gradually. Keep your jaw and forehead relaxed.  Hold 10 to 20 seconds. Keep your breathing relaxed.  Release the tension slowly. Relax your neck muscles completely before you start the next  repetition. Repeat __________ times. Complete this exercise __________ times per day. STRENGTH - Cervical Extensors, Isometric   Stand about 6 inches away from a wall. Place a small pillow, a ball about 6-8 inches in diameter, or a folded towel between the back of your head and the wall.  Slightly tuck your chin and gently tilt your head back into the soft object. Push only with mild to moderate intensity, building up tension gradually. Keep your jaw and forehead relaxed.  Hold 10 to 20 seconds. Keep your breathing relaxed.  Release the tension slowly. Relax your neck muscles completely before you start the next repetition. Repeat __________ times. Complete this exercise __________ times per day. POSTURE AND BODY MECHANICS CONSIDERATIONS - Cervical Strain and Sprain Keeping correct posture when sitting, standing or completing your activities will reduce the stress put on different body tissues, allowing injured tissues a chance to heal and limiting painful experiences. The following are general guidelines for improved posture. Your physician or physical therapist will provide you with any instructions specific to your needs. While reading these guidelines, remember:  The exercises prescribed by your provider will help you have the flexibility and strength to maintain correct postures.  The correct posture provides the optimal environment for your joints to work.  POSTURE AND BODY MECHANICS CONSIDERATIONS - Cervical Strain and Sprain  Keeping correct posture when sitting, standing or completing your activities will reduce the stress put on different body tissues, allowing injured tissues a chance to heal and limiting painful experiences. The following are general guidelines for improved posture. Your physician or physical therapist will provide you with any instructions specific to your needs. While reading these guidelines, remember:  · The exercises prescribed by your provider will help you have the flexibility and strength to maintain correct postures.  · The correct posture provides the optimal environment for your joints to work. All of your joints have less wear and tear when properly supported by a spine with good posture. This means you will experience a healthier, less painful body.  · Correct posture must be practiced with all of your activities, especially prolonged sitting and standing. Correct posture is as important when doing repetitive low-stress activities (typing) as it is when doing a single heavy-load activity (lifting).  PROLONGED STANDING WHILE SLIGHTLY LEANING FORWARD  When completing a task that requires you to lean forward while standing in one  place for a long time, place either foot up on a stationary 2- to 4-inch high object to help maintain the best posture. When both feet are on the ground, the low back tends to lose its slight inward curve. If this curve flattens (or becomes too large), then the back and your other joints will experience too much stress, fatigue more quickly, and can cause pain.   RESTING POSITIONS  Consider which positions are most painful for you when choosing a resting position. If you have pain with flexion-based activities (sitting, bending, stooping, squatting), choose a position that allows you to rest in a less flexed posture. You would want to avoid curling into a fetal position on your side. If your pain worsens with extension-based activities (prolonged standing, working overhead), avoid resting in an extended position such as sleeping on your stomach. Most people will find more comfort when they rest with their spine in a more neutral position, neither too rounded nor too arched. Lying on a non-sagging bed on your side with a pillow between your knees, or on your back with a pillow under your knees will often provide some relief. Keep in mind, being in any one position for a prolonged period of time, no matter how correct your posture, can still lead to stiffness.  WALKING  Walk with an upright posture. Your ears, shoulders, and hips should all line up.  OFFICE WORK  When working at a desk, create an environment that supports good, upright posture. Without extra support, muscles fatigue and lead to excessive strain on joints and other tissues.  CHAIR:  · A chair should be able to slide under your desk when your back makes contact with the back of the chair. This allows you to work closely.  · The chair's height should allow your eyes to be level with the upper part of your monitor and your hands to be slightly lower than your elbows.  · Body position:    Your feet should make contact with the floor. If this is not  possible, use a foot rest.    Keep your ears over your shoulders. This will reduce stress on your neck and low back.     This information is not intended to replace advice given to you by your health care provider. Make sure you discuss any questions you have with your health care provider.

## 2015-10-25 IMAGING — CT CT ANGIO CHEST
2 of 5 series · 17 of 36 positions shown · IV contrast (Omnipaque 300)
Comparison: Prior MRA chest 02/27/2012; prior CT chest 09/24/2010

CLINICAL DATA: Follow-up thoracic aortic dilatation

EXAM:
CT ANGIOGRAPHY CHEST WITH CONTRAST
TECHNIQUE: Multidetector CT imaging of the chest was performed using the
standard protocol during bolus administration of intravenous
contrast. Multiplanar CT image reconstructions and MIPs were
obtained to evaluate the vascular anatomy.
CONTRAST:  100mL OMNIPAQUE IOHEXOL 350 MG/ML SOLN

[Series 4: cta chest w/cm 3mm · axial · 0.65mm/px · z∈[-284,-32]mm · 16 of 92 slices shown]
[im 4/92  lung]
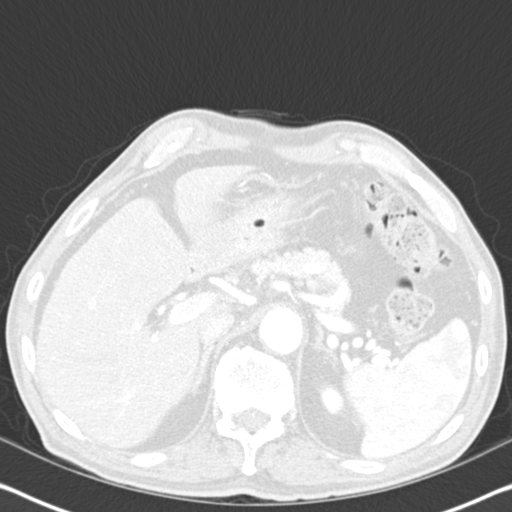
[im 11/92  mediastinal]
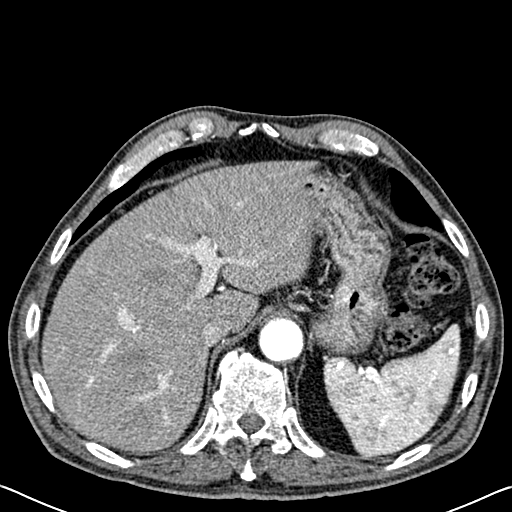
[im 17/92  lung]
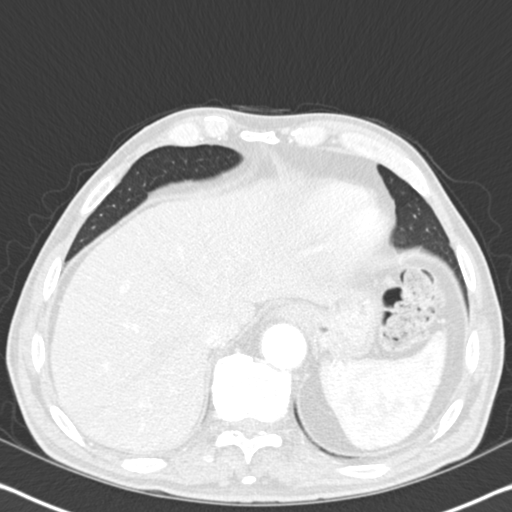
[im 21/92  mediastinal]
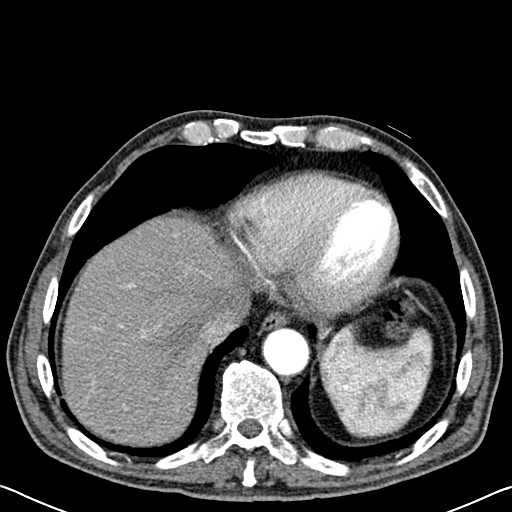
[im 27/92  lung]
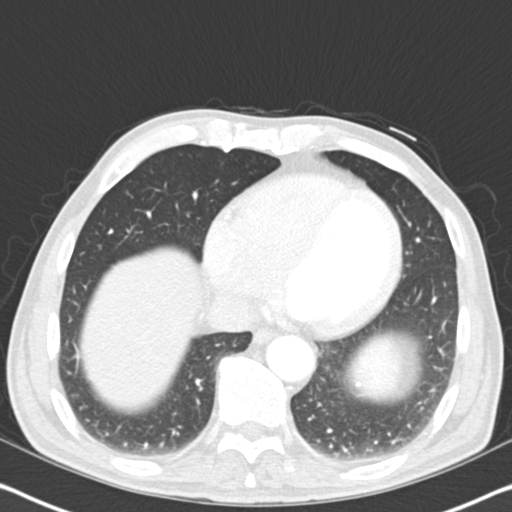
[im 31/92  mediastinal]
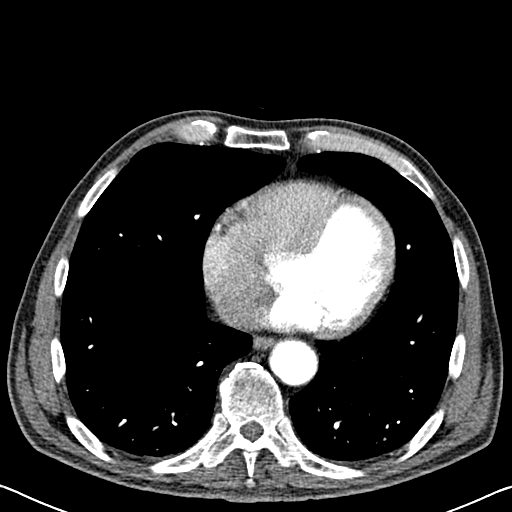
[im 38/92  lung]
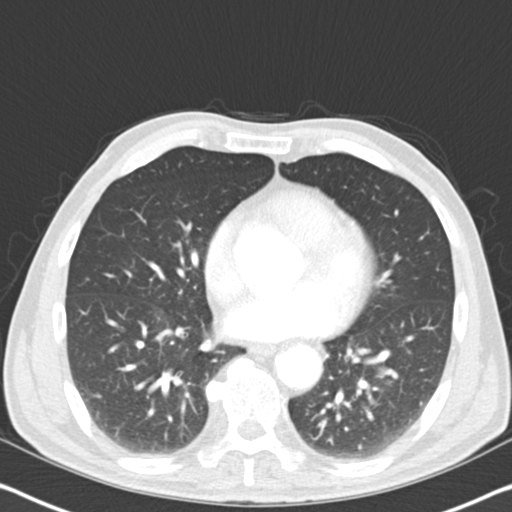
[im 44/92  mediastinal]
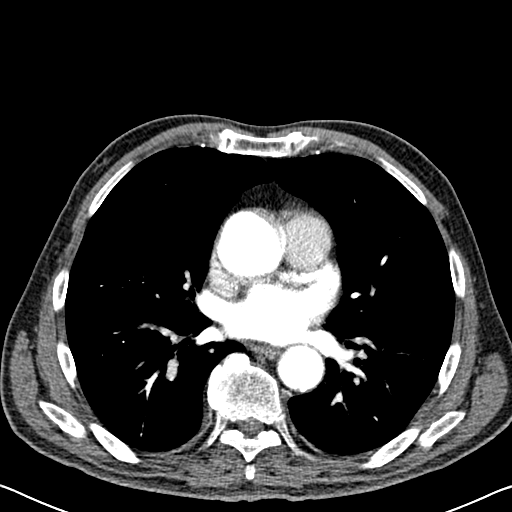
[im 48/92  lung]
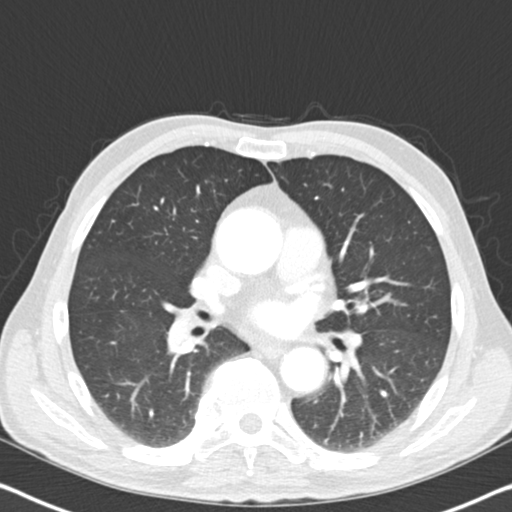
[im 54/92  mediastinal]
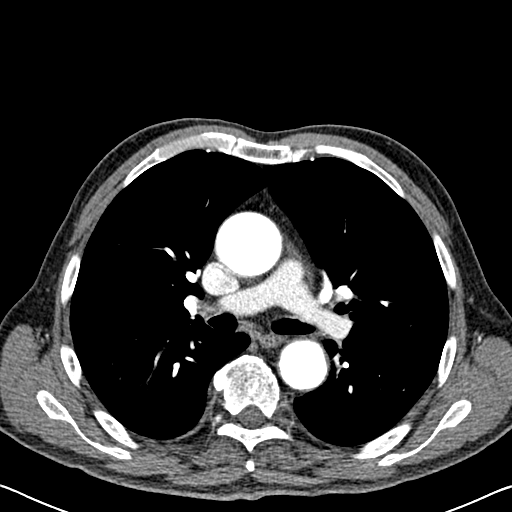
[im 61/92  lung]
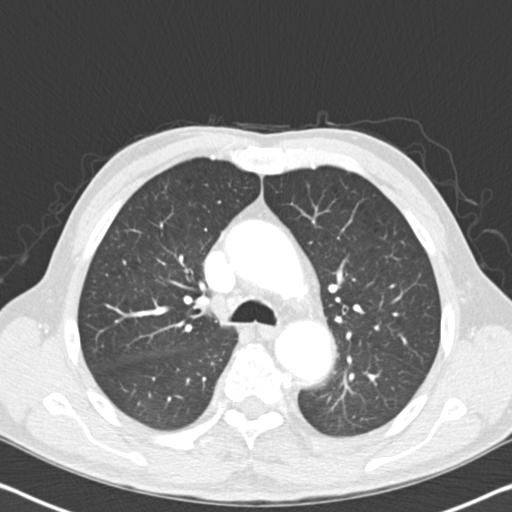
[im 65/92  mediastinal]
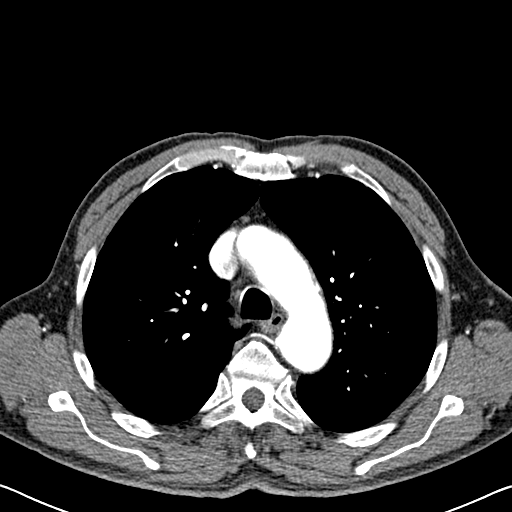
[im 71/92  lung]
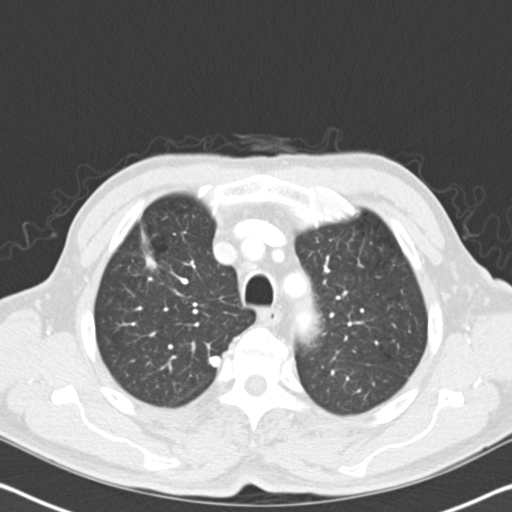
[im 75/92  mediastinal]
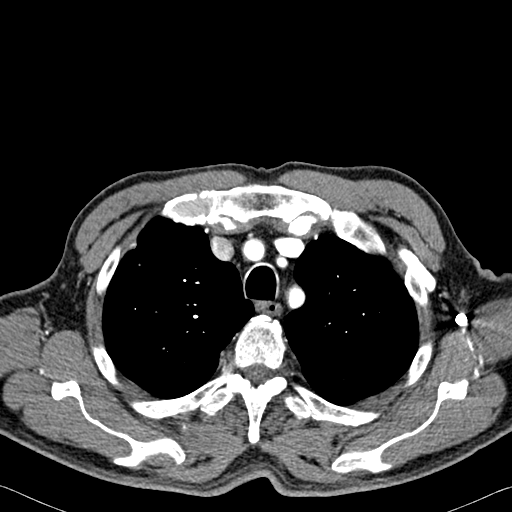
[im 81/92  lung]
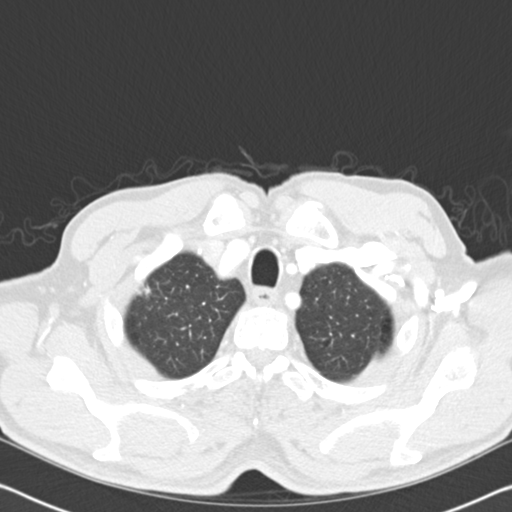
[im 88/92  mediastinal]
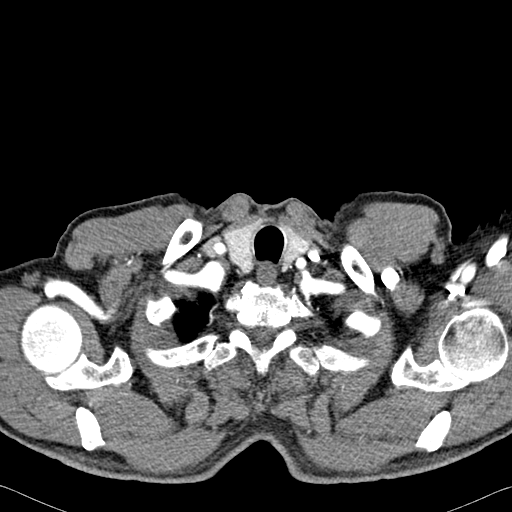

[Series 602: cor mpr · coronal · 0.65mm/px · 1 of 110 slices shown]
[im 55/110  mediastinal]
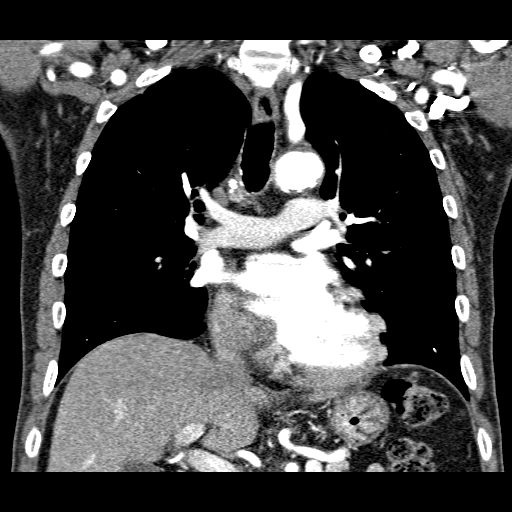

[17 of 36 positions shown; findings below may reference images not displayed]

FINDINGS: Mediastinum: Unremarkable CT appearance of the thyroid gland. No
suspicious mediastinal or hilar adenopathy. No soft tissue
mediastinal mass. The thoracic esophagus is unremarkable. Small
calcified mediastinal nodes likely reflect old granulomatous
disease.

Heart/Vascular: Slightly limited evaluation secondary to motion
related artifact. Ectasia of the ascending thoracic aorta is
essentially unchanged and again measures 4.1 cm at the level of the
right main pulmonary artery. Normal appearance of the aortic root
and Ritumoni junction. Trace atherosclerotic calcifications
without significant stenosis. No evidence of dissection. Persistent
mild aneurysmal dilatation of the proximal descending thoracic
aorta. Maximal transverse diameter Stable at 3.6 cm. More distal
descending thoracic aorta is unremarkable.

The heart is within normal limits for size. No pericardial effusion.

Lungs/Pleura: Nonspecific 5 mm sub solid ground-glass attenuation
nodular opacity in the right upper lobe (image 13 series 5). More
inferiorly, there is a spiculated 1.1 x 0.7 cm nodule affiliated
with linear pleural parenchymal scar in the right upper lobe (image
21 series 5). Stable calcified granuloma in the medial aspect of the
right upper lobe. Background of mild centrilobular emphysema.

Bones/Soft Tissues: No acute fracture or aggressive appearing lytic
or blastic osseous lesion.

Upper Abdomen: Scattered punctate calcifications throughout the
spleen consistent with old granulomatous disease. No definite
hepatic lesion. Visualized adrenal glands are unremarkable. No acute
abnormality in the visualized upper abdomen.

Review of the MIP images confirms the above findings.
IMPRESSION: VASCULAR

1. Stable fusiform dilatation of the ascending thoracic aorta with a
maximal diameter of 4.1 cm, and proximal descending thoracic aorta
to 3.6 cm.
2. Mild aortic atherosclerosis.
NON VASCULAR

1. Interval development of a new 1.1 x 0.7 cm spiculated pulmonary
nodule in the right upper lobe associated with a band of linear
pleural parenchymal scarring. Differential considerations include
nodular scarring and primary bronchogenic carcinoma. Recommend
further evaluation with PET-CT.
2. Nonspecific 5 mm sub solid nodule more superiorly in the right
lung apex. This abnormality could also be further evaluated by
PET-CT although it may be below the size threshold for effective
evaluation.
3. Background of centrilobular emphysema, old granulomatous disease
and chronic mild bronchial wall thickening.
These results will be called to the ordering clinician or
representative by the Radiologist Assistant, and communication
documented in the PACS Dashboard.

## 2016-01-01 ENCOUNTER — Other Ambulatory Visit: Payer: Self-pay | Admitting: Cardiology

## 2016-01-01 NOTE — Telephone Encounter (Signed)
Rx(s) sent to pharmacy electronically.  

## 2016-01-15 ENCOUNTER — Ambulatory Visit (INDEPENDENT_AMBULATORY_CARE_PROVIDER_SITE_OTHER): Payer: Self-pay | Admitting: Physician Assistant

## 2016-01-15 VITALS — BP 120/80 | HR 64 | Temp 97.8°F | Resp 20 | Ht 69.0 in | Wt 159.0 lb

## 2016-01-15 DIAGNOSIS — Z021 Encounter for pre-employment examination: Secondary | ICD-10-CM

## 2016-01-15 DIAGNOSIS — Z0289 Encounter for other administrative examinations: Secondary | ICD-10-CM

## 2016-01-15 NOTE — Patient Instructions (Signed)
     IF you received an x-ray today, you will receive an invoice from [facility]. Please contact [facility] at [phone] with questions or concerns regarding your invoice.   IF you received labwork today, you will receive an invoice from [facility]. Please contact [facility] at [phone] with questions or concerns regarding your invoice.   Our billing staff will not be able to assist you with questions regarding bills from these companies.  You will be contacted with the lab results as soon as they are available. The fastest way to get your results is to activate your My Chart account. Instructions are located on the last page of this paperwork. If you have not heard from us regarding the results in 2 weeks, please contact this office.      

## 2016-01-15 NOTE — Progress Notes (Signed)
[facility] [address] [place] [address] [phone]  Date:  01/15/2016   Name:  Wayne Rodriguez   DOB:  08/03/1946   MRN:  [id number]  PCP:  Orpah Melter, MD    History of Present Illness:  Wayne Rodriguez is a 70 y.o. 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 He has no concerns or complaints at this time.  He is compliant on the linopril-HCTZ.  BP appears to be well controlled and followed.      Patient Active Problem List   Diagnosis Date Noted  . PVC's (premature ventricular contractions) 07/17/2015  . Mitral valve prolapse 07/17/2015  . Benign tumor of lung 03/17/2014  . Lung nodule 02/28/2014  . Palpitations   . Decreased exercise tolerance   . Aortic root dilatation ([place]) 07/15/2011  . HTN (hypertension) 07/15/2011  . Chest pain 07/15/2011    Past Medical History  Diagnosis Date  . Chest pain     a. 02/2010 Cath: normal  . HTN (hypertension)   . GERD (gastroesophageal reflux disease)   . Back pain   . Aortic root dilatation ([place])     a. 09/2010 CT: 4.1cm Max Asc Ao diam, 3.7cm Max Desc Ao diam;; s/p MRI/MRA in April 2013  . Decreased exercise tolerance     a. 02/2012  . Palpitations     a. h/o palps r/t caffeine - has occurred again 02/2012  . Pulmonary nodule   . Vertigo     hx of  . Cancer ([place])     skin cancer removed from face  . PVC's (premature ventricular contractions) 07/17/2015  . Mitral valve prolapse     Past Surgical History  Procedure Laterality Date  . Back surgery  1983  . Eye surgery  1997    remove foreign body; right  . Colonoscopy w/ polypectomy    . Cardiac catheterization      no PCI  . Video assisted thoracoscopy (vats)/wedge resection Right 03/17/2014    Procedure: VIDEO ASSISTED THORACOSCOPY (VATS)/WEDGE RESECTION;  Surgeon: Melrose Nakayama, MD;  Location: [place];  Service: Thoracic;  Laterality: Right;    Social History  Substance Use Topics  . Smoking status: Former Smoker --  1.00 packs/day for 30 years    Types: Cigarettes    Start date: 03/08/1962    Quit date: 03/08/1992  . Smokeless tobacco: Former Systems developer    Quit date: 01/06/2013  . Alcohol Use: No    Family History  Problem Relation Age of Onset  . Lung cancer Mother   . Colon cancer Sister 53    Allergies  Allergen Reactions  . Beta Adrenergic Blockers     Bradycardia   . Codeine     REACTION: nausea/vomiting    Medication list has been reviewed and updated.  Current Outpatient Prescriptions on File Prior to Visit  Medication Sig Dispense Refill  . aspirin 81 MG tablet Take 81 mg by mouth daily.      . Cholecalciferol (VITAMIN D-3 PO) Take 1,000 mg by mouth daily.     . Cyanocobalamin (VITAMIN B-12 PO) Take 1 tablet by mouth daily.     Marland Kitchen lisinopril-hydrochlorothiazide (PRINZIDE,ZESTORETIC) 20-12.5 MG tablet TAKE 2 TABLETS BY MOUTH DAILY. 180 tablet 3  . nitroGLYCERIN (NITROSTAT) 0.4 MG SL tablet Place 1 tablet (0.4 mg total) under the tongue every 5 (five) minutes as needed. (Patient not taking: Reported on 01/15/2016) 25 tablet 11  . Probiotic Product (  PROBIOTIC DAILY PO) Take 1 capsule by mouth daily. Reported on 01/15/2016     No current facility-administered medications on file prior to visit.    Review of Systems  Constitutional: Negative for fever and chills.  HENT: Negative for ear discharge, ear pain and sore throat.   Eyes: Negative for blurred vision and double vision.  Respiratory: Negative for cough, shortness of breath and wheezing.   Cardiovascular: Negative for chest pain, palpitations and leg swelling.  Gastrointestinal: Negative for nausea, vomiting and diarrhea.  Genitourinary: Negative for dysuria, frequency and hematuria.  Skin: Negative for itching and rash.  Neurological: Negative for dizziness and headaches.     Physical Examination: BP 120/80 mmHg  Pulse 64  Temp(Src) 97.8 F (36.6 C) (Oral)  Resp 20  Ht 5\' 9"  (1.753 m)  Wt 159 lb (72.122 kg)  BMI 23.47  kg/m2  SpO2 98% Ideal Body Weight: Weight in (lb) to have BMI = 25: 168.9  Physical Exam  Constitutional: He is oriented to person, place, and time. He appears well-developed and well-nourished. No distress.  HENT:  Head: Normocephalic and atraumatic.  Right Ear: Tympanic membrane, external ear and ear canal normal.  Left Ear: Tympanic membrane, external ear and ear canal normal.  Eyes: Conjunctivae and EOM are normal. Pupils are equal, round, and reactive to light.  Cardiovascular: Normal rate, regular rhythm, normal heart sounds and intact distal pulses.  Exam reveals no friction rub.   No murmur heard. Pulmonary/Chest: Effort normal and breath sounds normal. No respiratory distress. He has no wheezes.  Abdominal: Soft. Bowel sounds are normal. He exhibits no distension and no mass. There is no tenderness. Hernia confirmed negative in the right inguinal area and confirmed negative in the left inguinal area.  Musculoskeletal: Normal range of motion. He exhibits no edema or tenderness.  Neurological: He is alert and oriented to person, place, and time. He has normal reflexes. He displays normal reflexes. No cranial nerve deficit. Coordination normal.  Skin: Skin is warm and dry. He is not diaphoretic.  Psychiatric: He has a normal mood and affect. His behavior is normal.     Assessment and Plan: Wayne Rodriguez is a 70 y.o. male who is here today for DOT exam.  Encounter for examination required by Department of Transportation (DOT)  Ivar Drape, PA-C [facility] 3/13/20175:02 PM

## 2016-03-01 ENCOUNTER — Encounter: Payer: Self-pay | Admitting: Gastroenterology

## 2016-04-04 ENCOUNTER — Other Ambulatory Visit: Payer: Self-pay | Admitting: Thoracic Surgery (Cardiothoracic Vascular Surgery)

## 2016-04-04 DIAGNOSIS — I712 Thoracic aortic aneurysm, without rupture: Secondary | ICD-10-CM

## 2016-04-04 DIAGNOSIS — I7121 Aneurysm of the ascending aorta, without rupture: Secondary | ICD-10-CM

## 2016-04-30 ENCOUNTER — Ambulatory Visit (INDEPENDENT_AMBULATORY_CARE_PROVIDER_SITE_OTHER): Payer: Commercial Managed Care - HMO | Admitting: Thoracic Surgery (Cardiothoracic Vascular Surgery)

## 2016-04-30 ENCOUNTER — Encounter: Payer: Self-pay | Admitting: Thoracic Surgery (Cardiothoracic Vascular Surgery)

## 2016-04-30 ENCOUNTER — Ambulatory Visit
Admission: RE | Admit: 2016-04-30 | Discharge: 2016-04-30 | Disposition: A | Discharge status: Home / Self Care | Payer: Commercial Managed Care - HMO | Source: Ambulatory Visit | Attending: Thoracic Surgery (Cardiothoracic Vascular Surgery) | Admitting: Thoracic Surgery (Cardiothoracic Vascular Surgery)

## 2016-04-30 VITALS — BP 133/64 | HR 65 | Resp 16 | Ht 69.0 in | Wt 157.4 lb

## 2016-04-30 DIAGNOSIS — I712 Thoracic aortic aneurysm, without rupture, unspecified: Secondary | ICD-10-CM

## 2016-04-30 DIAGNOSIS — I7121 Aneurysm of the ascending aorta, without rupture: Secondary | ICD-10-CM

## 2016-04-30 DIAGNOSIS — D1431 Benign neoplasm of right bronchus and lung: Secondary | ICD-10-CM | POA: Diagnosis not present

## 2016-04-30 MED ORDER — IOPAMIDOL (ISOVUE-370) INJECTION 76%
75.0000 mL | Freq: Once | INTRAVENOUS | Status: AC | PRN
  Administered 2016-04-30: 75 mL via INTRAVENOUS

## 2016-04-30 NOTE — Progress Notes (Signed)
[address]       [place] [address]             [phone]       HPI: Mr. Lienhard returns for a scheduled 1 year follow-up visit  He is a 70 year old man who had a thoracoscopic wedge resection of hypermetabolic nodules in his right upper lobe in 2015. The nodules turned out to be inflammatory. There never was a specific name given to them that even with outside consultation. It might have been old healing granulomas. He did well postoperatively without any major complications. He also has a dilated ascending aorta.  I last saw him in the office in 2016. He had scarring from his previous artery but no new lung nodules. He did have a 4.3 cm ascending aneurysm and evidence of aortic atherosclerotic disease.  He has been feeling well with no major health problems over the past year. He remains relatively active, and still works part-time. He quit smoking in 1993. His appetite is good. His weight is stable. He is not having any chest pain or shortness of breath.  Past Medical History  Diagnosis Date  . Chest pain     a. 02/2010 Cath: normal  . HTN (hypertension)   . GERD (gastroesophageal reflux disease)   . Back pain   . Aortic root dilatation ([place])     a. 09/2010 CT: 4.1cm Max Asc Ao diam, 3.7cm Max Desc Ao diam;; s/p MRI/MRA in April 2013  . Decreased exercise tolerance     a. 02/2012  . Palpitations     a. h/o palps r/t caffeine - has occurred again 02/2012  . Pulmonary nodule   . Vertigo     hx of  . Cancer ([place])     skin cancer removed from face  . PVC's (premature ventricular contractions) 07/17/2015  . Mitral valve prolapse     Current Outpatient Prescriptions  Medication Sig Dispense Refill  . aspirin 81 MG tablet Take 81 mg by mouth daily.      . Cholecalciferol (VITAMIN D-3 PO) Take 1,000 mg by mouth daily.     . Cyanocobalamin (VITAMIN B-12 PO) Take 1 tablet by mouth daily.     Marland Kitchen lisinopril-hydrochlorothiazide (PRINZIDE,ZESTORETIC) 20-12.5 MG  tablet TAKE 2 TABLETS BY MOUTH DAILY. 180 tablet 3  . nitroGLYCERIN (NITROSTAT) 0.4 MG SL tablet Place 1 tablet (0.4 mg total) under the tongue every 5 (five) minutes as needed. 25 tablet 11  . Probiotic Product (PROBIOTIC DAILY PO) Take 1 capsule by mouth daily. Reported on 01/15/2016     No current facility-administered medications for this visit.    Physical Exam BP 133/64 mmHg  Pulse 65  Resp 16  Ht 5\' 9"  (1.753 m)  Wt 157 lb 6.4 oz (71.396 kg)  BMI 23.23 kg/m2  SpO35 59% 70 year old man in no acute distress Well-developed well-nourished No cervical or subclavicular adenopathy Cardiac regular rate and rhythm with a harsh systolic murmur at the left lower sternal border and apex Lungs clear with equal breath sounds bilaterally Multiple soft subcutaneous nodules on arms  Diagnostic Tests: CT ANGIOGRAPHY CHEST WITH CONTRAST  TECHNIQUE: Multidetector CT imaging of the chest was performed using the standard protocol during bolus administration of intravenous contrast. Multiplanar CT image reconstructions and MIPs were obtained to evaluate the vascular anatomy.  CONTRAST: 75 mL of Isovue 370 intravenously.  COMPARISON: CT scan of April 25, 2015.  FINDINGS: Atherosclerosis of thoracic aorta is noted. No pneumothorax  or pleural effusion is noted. Stable scarring is noted in right upper lobe. There is interval development of 9 mm nodular lesion seen laterally in left upper lobe best seen on image number 138 of series 604. This is highly concerning for malignancy. Minimal emphysematous changes noted in the left upper lobe. There is no evidence of thoracic aortic dissection. 4.3 cm ascending thoracic aortic aneurysm is again noted and stable compared to prior exam. Great vessels are widely patent without significant stenosis.  Transverse aortic arch measures 2.7 cm in diameter. Proximal portion of descending thoracic aorta measures 3.7 cm which is dilated. Distal  descending thoracic aorta measures 3.1 cm. No significant mediastinal mass or adenopathy is noted. Pulmonary vessels are not well opacified. Visualized portion of upper abdomen is unremarkable. Anterior osteophyte formation is seen in the middle and lower thoracic spine.  Review of the MIP images confirms the above findings.  IMPRESSION: Aortic atherosclerosis is noted. There is no evidence of dissection.  Stable 4.3 cm ascending thoracic aortic aneurysm is noted compared to prior exam. Recommend annual imaging followup by CTA or MRA. This recommendation follows 2010 ACCF/AHA/AATS/ACR/ASA/SCA/SCAI/SIR/STS/SVM Guidelines for the Diagnosis and Management of Patients with Thoracic Aortic Disease. Circulation. 2010; 121SP:1689793.  Stable right upper lobe scarring is noted consistent with history of prior resection.  Interval development of 9 mm nodular lesion seen in left upper lobe, which is suspicious for malignancy. Consider one of the following in 3 months for both low-risk and high-risk individuals: (a) repeat chest CT, (b) follow-up PET-CT, or (c) tissue sampling. This recommendation follows the consensus statement: Guidelines for Management of Incidental Pulmonary Nodules Detected on CT Images:From the Fleischner Society 2017; published online before print (10.1148/radiol.SG:5268862). These results will be called to the ordering clinician or representative by the Radiologist Assistant, and communication documented in the PACS or zVision Dashboard.   Electronically Signed  By: Marijo Conception, M.D.  On: 04/30/2016 10:20  I personally reviewed the CT chest and concur with the findings as noted above.  Impression: 70 year old man with a history of remote tobacco abuse who had a wedge resection for inflammatory nodules in the right upper lobe in 2015. He also has an ascending aneurysm.  Lung nodules- in the interim since his last scan is developed a new left upper  lobe nodule that is 9-10 mm in maximal size. Given his history of previous inflammatory nodules I think that is the most likely scenario. I discussed the options of wedge resection, versus attempted biopsy, versus radiographic follow-up with him. We discussed the relative advantages and disadvantages to each approach. I think this would be a difficult lesion to biopsy at its present size. Since he has a history of similar-appearing nodules that turned out to be infectious/inflammatory, I think that is the most likely scenario here. I recommended that we do a short interval follow-up in 3 months with a PET/CT. He is agreeable with that suggestion.  Ascending aneurysm- stable at 4.3 cm maximal diameter. Blood pressure control remains the mainstay of treatment. This does need continued follow-up.  Hypertension- blood pressure well controlled today  Mitral valve prolapse- mild to moderate MR, followed by Dr. Martinique.  Plan: Return in 3 months with PET/CT to follow-up left upper lobe nodule.  Melrose Nakayama, MD [facility] [phone]

## 2016-06-27 ENCOUNTER — Other Ambulatory Visit: Payer: Self-pay | Admitting: *Deleted

## 2016-06-27 DIAGNOSIS — R911 Solitary pulmonary nodule: Secondary | ICD-10-CM

## 2016-07-30 ENCOUNTER — Ambulatory Visit (HOSPITAL_COMMUNITY)
Admission: RE | Admit: 2016-07-30 | Discharge: 2016-07-30 | Disposition: A | Discharge status: Home / Self Care | Payer: Commercial Managed Care - HMO | Source: Ambulatory Visit | Attending: Thoracic Surgery (Cardiothoracic Vascular Surgery) | Admitting: Thoracic Surgery (Cardiothoracic Vascular Surgery)

## 2016-07-30 ENCOUNTER — Encounter: Payer: Self-pay | Admitting: Thoracic Surgery (Cardiothoracic Vascular Surgery)

## 2016-07-30 ENCOUNTER — Ambulatory Visit (INDEPENDENT_AMBULATORY_CARE_PROVIDER_SITE_OTHER): Payer: Commercial Managed Care - HMO | Admitting: Thoracic Surgery (Cardiothoracic Vascular Surgery)

## 2016-07-30 VITALS — BP 150/72 | HR 50 | Resp 20 | Ht 69.0 in | Wt 157.0 lb

## 2016-07-30 DIAGNOSIS — I712 Thoracic aortic aneurysm, without rupture, unspecified: Secondary | ICD-10-CM

## 2016-07-30 DIAGNOSIS — I2510 Atherosclerotic heart disease of native coronary artery without angina pectoris: Secondary | ICD-10-CM | POA: Insufficient documentation

## 2016-07-30 DIAGNOSIS — R911 Solitary pulmonary nodule: Secondary | ICD-10-CM | POA: Diagnosis present

## 2016-07-30 DIAGNOSIS — I898 Other specified noninfective disorders of lymphatic vessels and lymph nodes: Secondary | ICD-10-CM | POA: Insufficient documentation

## 2016-07-30 DIAGNOSIS — J439 Emphysema, unspecified: Secondary | ICD-10-CM | POA: Diagnosis not present

## 2016-07-30 DIAGNOSIS — N400 Enlarged prostate without lower urinary tract symptoms: Secondary | ICD-10-CM | POA: Diagnosis not present

## 2016-07-30 DIAGNOSIS — K5730 Diverticulosis of large intestine without perforation or abscess without bleeding: Secondary | ICD-10-CM | POA: Diagnosis not present

## 2016-07-30 DIAGNOSIS — I700 Atherosclerosis of aorta: Secondary | ICD-10-CM | POA: Diagnosis not present

## 2016-07-30 DIAGNOSIS — K4090 Unilateral inguinal hernia, without obstruction or gangrene, not specified as recurrent: Secondary | ICD-10-CM | POA: Insufficient documentation

## 2016-07-30 DIAGNOSIS — R918 Other nonspecific abnormal finding of lung field: Secondary | ICD-10-CM

## 2016-07-30 LAB — GLUCOSE, CAPILLARY: Glucose-Capillary: 96 mg/dL (ref 65–99)

## 2016-07-30 MED ORDER — FLUDEOXYGLUCOSE F - 18 (FDG) INJECTION: 7.8000 | Freq: Once | INTRAVENOUS | Status: DC | PRN

## 2016-07-30 NOTE — Progress Notes (Signed)
[address]       [place] [address]             [phone]       HPI: Mr. Leshko returns today for follow-up of a lung nodule.  He is a 70 year old man who had a thoracoscopic wedge resection of hypermetabolic nodules from the right upper lobe in 2015. Nodules turned out to be inflammatory. Path was sent for outside consultation but even with that no specific diagnosis was possible.  I saw him in the office in June. That was for a one-year follow-up. His CT scan showed a new 9 mm left upper lobe nodule. We discussed biopsying or removing the nodule versus observation. He elected to have observation. He now returns with a PET/CT.  The interim since his last visit he has been feeling well. He's not having any chest pain, shortness of breath, or wheezing. He denies any significant cough or hemoptysis. His appetite is good and his weight is stable.  Past Medical History:  Diagnosis Date  . Aortic root dilatation ([place])    a. 09/2010 CT: 4.1cm Max Asc Ao diam, 3.7cm Max Desc Ao diam;; s/p MRI/MRA in April 2013  . Back pain   . Cancer ([place])    skin cancer removed from face  . Chest pain    a. 02/2010 Cath: normal  . Decreased exercise tolerance    a. 02/2012  . GERD (gastroesophageal reflux disease)   . HTN (hypertension)   . Mitral valve prolapse   . Palpitations    a. h/o palps r/t caffeine - has occurred again 02/2012  . Pulmonary nodule   . PVC's (premature ventricular contractions) 07/17/2015  . Vertigo    hx of      Current Outpatient Prescriptions  Medication Sig Dispense Refill  . aspirin 81 MG tablet Take 81 mg by mouth daily.      . Cholecalciferol (VITAMIN D-3 PO) Take 1,000 mg by mouth daily.     . Cyanocobalamin (VITAMIN B-12 PO) Take 1 tablet by mouth daily.     Marland Kitchen lisinopril-hydrochlorothiazide (PRINZIDE,ZESTORETIC) 20-12.5 MG tablet TAKE 2 TABLETS BY MOUTH DAILY. 180 tablet 3  . nitroGLYCERIN (NITROSTAT) 0.4 MG SL tablet Place 1 tablet  (0.4 mg total) under the tongue every 5 (five) minutes as needed. 25 tablet 11  . Probiotic Product (PROBIOTIC DAILY PO) Take 1 capsule by mouth daily. Reported on 01/15/2016     No current facility-administered medications for this visit.    Facility-Administered Medications Ordered in Other Visits  Medication Dose Route Frequency Provider Last Rate Last Dose  . fludeoxyglucose F - 18 (FDG) injection 7.8 millicurie  7.8 millicurie Intravenous Once PRN David A Martinique, MD        Physical Exam BP (!) 150/72 (BP Location: Right Arm, Patient Position: Sitting, Cuff Size: Small)   Pulse (!) 50   Resp 20   Ht 5\' 9"  (1.753 m)   Wt 157 lb (71.2 kg)   SpO2 99% Comment: RA  BMI 23.45 kg/m  70 year old man in no acute distress Alert and oriented 3 with no focal deficits No cervical or supraclavicular adenopathy Cardiac regular rate and rhythm with a 2/6 murmur Lungs with equal breath sounds bilaterally, no wheezing  Diagnostic Tests: NUCLEAR MEDICINE PET SKULL BASE TO THIGH  TECHNIQUE: 7.8 mCi F-18 FDG was injected intravenously. Full-ring PET imaging was performed from the skull base to thigh after the radiotracer. CT data was obtained and  used for attenuation correction and anatomic localization.  FASTING BLOOD GLUCOSE:  Value: 96 mg/dl  COMPARISON:  04/30/2016 chest CT.  03/07/2014 PET-CT.  FINDINGS: NECK  No hypermetabolic lymph nodes in the neck. Stable postsurgical changes in the right globe.  CHEST  Irregular 0.9 x 0.6 cm (average diameter 0.8 cm) solid left upper lobe pulmonary nodule (series 8/image 21) demonstrates no significant metabolism (max SUV 1.2) and is stable in size since 04/30/2016.  Stable postsurgical changes in the right upper lobe from prior wedge resection, with stable bandlike scarring along the suture margins. No acute consolidative airspace disease or new significant pulmonary nodules. Mild centrilobular emphysema and mild diffuse  bronchial wall thickening.  There is a solitary hypermetabolic normal size 0.7 cm right paratracheal node with punctate internal calcifications (series 4/image 66) with max SUV 6.1, which measured 0.7 cm in size with a max SUV of 3.5 on 03/07/2014 PET-CT, stable in size and increased in metabolism. No additional hypermetabolic or enlarged mediastinal lymph nodes.  No hypermetabolic axillary or hilar nodes. Left anterior descending and right coronary atherosclerosis. Stable 4.5 cm ascending thoracic aortic aneurysm. Atherosclerotic thoracic aorta. No pleural effusions.  ABDOMEN/PELVIS  No abnormal hypermetabolic activity within the liver, pancreas, adrenal glands, or spleen. No hypermetabolic lymph nodes in the abdomen or pelvis. Stable granulomatous calcifications throughout the spleen. Atherosclerotic nonaneurysmal abdominal aorta. Stable small fat containing left inguinal hernia. Mild sigmoid diverticulosis. Mildly enlarged prostate with nonspecific internal prostatic calcifications.  SKELETON  No focal hypermetabolic activity to suggest skeletal metastasis.  IMPRESSION: 1. Irregular left upper lobe solid pulmonary nodule with average diameter 0.8 cm demonstrates no significant metabolism (max SUV 1.2), stable in size for 3 months, technically below PET resolution. Recommend attention on follow-up chest CT in 6 months. 2. Solitary hypermetabolic partially calcified nonenlarged right paratracheal lymph node, which is stable in size and appearance back to the 03/07/2014 PET-CT, although increased in metabolism. No additional hypermetabolic thoracic nodes. This node is nonspecific although probably reactive given the size stability. Management options include follow-up chest CT with IV contrast in 6 months and/or tissue sampling, as clinically warranted. 3. No findings of hypermetabolic metastatic disease. 4. Additional findings include stable 4.5 cm ascending  thoracic aortic aneurysm, aortic atherosclerosis, 2 vessel coronary atherosclerosis, mild emphysema, small fat containing left inguinal hernia, mild sigmoid diverticulosis and mildly enlarged prostate.   Electronically Signed   By: Ilona Sorrel M.D.   On: 07/30/2016 12:58  I personally reviewed the PET CT images. There is significant metabolic activity associated with the left upper lobe nodule. Furthermore the nodule is stable to slightly smaller in comparison to the CT from June. There is a hypermetabolic partially calcified right paratracheal lymph node. This is been stable in size for 18 months.  Impression: Mr. Scobey is a 69 year old gentleman who has a history of inflammatory lung nodules. Back in June he had a one-year follow-up CT which showed a new nodule in the left upper lobe. This was 9 mm in diameter. We decided to follow this with the PET CT at 3 months. He now returns for that study.  PET/CT showed no metabolic activity associated with the left upper lobe lesion. This would favor a benign etiology, but does not completely rule out the possibility of malignancy. He does need continued follow-up for that nodule.  There also was a hypermetabolic right paratracheal node. This node is stable in size for 18 months and has some calcifications. It was also hypermetabolic on his PET in [id number].  I favor this being a reactive node.  Plan:  Return in 6 months with CT chest.  Melrose Nakayama, MD [facility] [phone]

## 2016-09-20 NOTE — Progress Notes (Signed)
[name] Date of Birth: 1946/01/13 Medical Record [id number]  History of Present Illness: Mr. Merrithew is seen for follow up PVCs. He had a cardiac cath 5 years ago due to complaints of palpitations. This showed minor nonobstructive disease. Negative event monitor back last April of 2013. Other issues include HTN. He has a history of mildly dilated aortic root. No change by CT from 2011 to 2015. Last CT in June 2017 showed diameter 4.3 cm. A LUL nodule noted. PET scan was negative for hypermetabolism.   He was seen in May 2016 with complaints of palpitations with exertion or stress. ETT was done which demonstrated PVCs with exertion but was negative for ischemia. Echo showed normal EF with MV prolapse and mild to moderate MR.  On follow up today his symptoms are unchanged.  No  racing or dizziness. Sometimes irregular pulse after eating a heavy meal. No dyspnea or chest pain. Stays active.   Current Outpatient Prescriptions  Medication Sig Dispense Refill  . aspirin 81 MG tablet Take 81 mg by mouth daily.      . Cholecalciferol (VITAMIN D-3 PO) Take 1,000 mg by mouth daily.     . Cyanocobalamin (VITAMIN B-12 PO) Take 1 tablet by mouth daily.     Marland Kitchen lisinopril-hydrochlorothiazide (PRINZIDE,ZESTORETIC) 20-12.5 MG tablet TAKE 2 TABLETS BY MOUTH DAILY. 180 tablet 3  . nitroGLYCERIN (NITROSTAT) 0.4 MG SL tablet Place 1 tablet (0.4 mg total) under the tongue every 5 (five) minutes as needed. 25 tablet 11  . Probiotic Product (PROBIOTIC DAILY PO) Take 1 capsule by mouth daily. Reported on 01/15/2016     No current facility-administered medications for this visit.     Allergies  Allergen Reactions  . Beta Adrenergic Blockers     Bradycardia   . Codeine     REACTION: nausea/vomiting    Past Medical History:  Diagnosis Date  . Aortic root dilatation ([place])    a. 09/2010 CT: 4.1cm Max Asc Ao diam, 3.7cm Max Desc Ao diam;; s/p MRI/MRA in April 2013  . Back pain   . Cancer ([place])    skin cancer removed from face  . Chest pain    a. 02/2010 Cath: normal  . Decreased exercise tolerance    a. 02/2012  . GERD (gastroesophageal reflux disease)   . HTN (hypertension)   . Mitral valve prolapse   . Palpitations    a. h/o palps r/t caffeine - has occurred again 02/2012  . Pulmonary nodule   . PVC's (premature ventricular contractions) 07/17/2015  . Vertigo    hx of    Past Surgical History:  Procedure Laterality Date  . BACK SURGERY  1983  . CARDIAC CATHETERIZATION     no PCI  . COLONOSCOPY W/ POLYPECTOMY    . [place]   remove foreign body; right  . VIDEO ASSISTED THORACOSCOPY (VATS)/WEDGE RESECTION Right 03/17/2014   Procedure: VIDEO ASSISTED THORACOSCOPY (VATS)/WEDGE RESECTION;  Surgeon: Melrose Nakayama, MD;  Location: [facility];  Service: Thoracic;  Laterality: Right;    History  Smoking Status  . Former Smoker  . Packs/day: 1.00  . Years: 30.00  . Types: Cigarettes  . Start date: 03/08/1962  . Quit date: 03/08/1992  Smokeless Tobacco  . Former Systems developer  . Quit date: 01/06/2013    History  Alcohol Use No    Family History  Problem Relation Age of Onset  . Lung cancer Mother   . Colon cancer Sister 105    Review  of Systems: The review of systems is per the HPI.  All other systems were reviewed and are negative.  Physical Exam: BP (!) 130/58 (BP Location: Left Arm, Patient Position: Sitting, Cuff Size: Normal)   Pulse 66   Ht 5\' 9"  (1.753 m)   Wt 158 lb 3.2 oz (71.8 kg)   BMI 23.36 kg/m  Patient is very pleasant and in no acute distress. He is  thin.  Skin is warm and dry. Color is normal.  HEENT is unremarkable. Normocephalic/atraumatic. PERRL. Sclera are nonicteric. Neck is supple. No masses. No JVD. Lungs are clear. Cardiac exam shows a regular rate and rhythm. There is a grade 2/6 harsh mid systolic murmur with late peaking heard at the apex and left axilla.  Abdomen is soft. Extremities are without edema. Gait and ROM are intact. No gross  neurologic deficits noted.  Wt Readings from Last 3 Encounters:  09/23/16 158 lb 3.2 oz (71.8 kg)  07/30/16 157 lb (71.2 kg)  04/30/16 157 lb 6.4 oz (71.4 kg)     LABORATORY DATA:    Lab Results  Component Value Date   WBC 6.3 03/19/2014   HGB 12.3 (L) 03/19/2014   HCT 37.4 (L) 03/19/2014   PLT 131 (L) 03/19/2014   GLUCOSE 120 (H) 03/19/2014   CHOL 151 10/19/2012   TRIG 114.0 10/19/2012   HDL 43.60 10/19/2012   LDLCALC 85 10/19/2012   ALT 9 03/19/2014   AST 17 03/19/2014   NA 140 03/19/2014   K 3.9 03/19/2014   CL 102 03/19/2014   CREATININE 0.78 03/19/2014   BUN 9 03/19/2014   CO2 27 03/19/2014   TSH 1.50 02/17/2012   INR 1.11 03/15/2014   HGBA1C  02/24/2010    5.1 (NOTE)                                                                       According to the ADA Clinical Practice Recommendations for 2011, when HbA1c is used as a screening test:   >=6.5%   Diagnostic of Diabetes Mellitus           (if abnormal result  is confirmed)  5.7-6.4%   Increased risk of developing Diabetes Mellitus  References:Diagnosis and Classification of Diabetes Mellitus,Diabetes [id number] 1):S62-S69 and Standards of Medical Care in         Diabetes - 2011,Diabetes Care,2011,34  (Suppl 1):S11-S61.   ETT  Study Highlights     There was no ST segment deviation noted during stress.  Blood pressure demonstrated a hypertensive response to exercise.  Negative adequate GXT with the patient exercising to a peak HR 146 (96% APMHR) and workload of 8.5 mets. No chest pain or ECG changes of ischemia. Patient had an exaggerated BP response to exercise and occasional isolated PVC's during Stage 2 of exercise.    Echo: 04/12/15:Study Conclusions  - Left ventricle: The cavity size was normal. Systolic function was vigorous. The estimated ejection fraction was in the range of 65% to 70%. Wall motion was normal; there were no regional wall motion abnormalities. Left ventricular diastolic  function parameters were normal. - Aortic valve: There was moderate regurgitation. - Mitral valve: There is a prolapse of the posterior mitral valve leaflet associated with anteriorly directed  jet of mitral regurgitation. There was mild to moderate regurgitation directed anteriorly. - Right ventricle: Systolic function was normal. - Right atrium: The atrium was normal in size. - Tricuspid valve: There was trivial regurgitation. - Pulmonary arteries: Systolic pressure was within the normal range. - Inferior vena cava: The vessel was normal in size. The respirophasic diameter changes were in the normal range (= 50%), consistent with normal central venous pressure. - Pericardium, extracardiac: There was no pericardial effusion.  Ecg today shows NSR with first degree AV block. Otherwise normal. I have personally reviewed and interpreted this study.  Assessment / Plan:  1. PVCs.  PVCs are benign based on normal EF and no ischemia. Discussed taking a beta blocker to reduce his symptoms but at this time he doesn't feel his symptoms are bad enough to take medication.    2. MV prolapse- posterior leaflet- mild to moderate. Asymptomatic. Plan follow up Echo every 2-3 years.   3. Aortic root aneurysm-  Mild enlargement by CT 4.3 cm. Followed by Dr. Roxan Hockey.  4. Lung nodules    5. HTN controlled.  I will follow up in one year.

## 2016-09-23 ENCOUNTER — Encounter: Payer: Self-pay | Admitting: Cardiology

## 2016-09-23 ENCOUNTER — Ambulatory Visit (INDEPENDENT_AMBULATORY_CARE_PROVIDER_SITE_OTHER): Payer: Commercial Managed Care - HMO | Admitting: Cardiology

## 2016-09-23 VITALS — BP 130/58 | HR 66 | Ht 69.0 in | Wt 158.2 lb

## 2016-09-23 DIAGNOSIS — I341 Nonrheumatic mitral (valve) prolapse: Secondary | ICD-10-CM | POA: Diagnosis not present

## 2016-09-23 DIAGNOSIS — I77810 Thoracic aortic ectasia: Secondary | ICD-10-CM

## 2016-09-23 DIAGNOSIS — I493 Ventricular premature depolarization: Secondary | ICD-10-CM | POA: Diagnosis not present

## 2016-09-23 DIAGNOSIS — I10 Essential (primary) hypertension: Secondary | ICD-10-CM | POA: Diagnosis not present

## 2016-09-23 NOTE — Patient Instructions (Signed)
Continue your current therapy  I will see you in one year   

## 2016-12-19 ENCOUNTER — Other Ambulatory Visit: Payer: Self-pay | Admitting: *Deleted

## 2016-12-19 DIAGNOSIS — R918 Other nonspecific abnormal finding of lung field: Secondary | ICD-10-CM

## 2017-01-16 ENCOUNTER — Ambulatory Visit (INDEPENDENT_AMBULATORY_CARE_PROVIDER_SITE_OTHER): Payer: Self-pay | Admitting: Physician Assistant

## 2017-01-16 VITALS — BP 144/58 | HR 84 | Temp 98.1°F | Resp 16 | Ht 69.0 in | Wt 161.0 lb

## 2017-01-16 DIAGNOSIS — H52223 Regular astigmatism, bilateral: Secondary | ICD-10-CM | POA: Diagnosis not present

## 2017-01-16 DIAGNOSIS — H5203 Hypermetropia, bilateral: Secondary | ICD-10-CM | POA: Diagnosis not present

## 2017-01-16 DIAGNOSIS — Z0289 Encounter for other administrative examinations: Secondary | ICD-10-CM

## 2017-01-16 DIAGNOSIS — H524 Presbyopia: Secondary | ICD-10-CM | POA: Diagnosis not present

## 2017-01-16 DIAGNOSIS — H3530 Unspecified macular degeneration: Secondary | ICD-10-CM | POA: Diagnosis not present

## 2017-01-16 NOTE — Progress Notes (Signed)
PRIMARY CARE AT [facility] [address], [place] [address] [phone]  Date:  01/16/2017   Name:  Wayne Rodriguez   DOB:  May 02, 1946   MRN:  [id number]  PCP:  Orpah Melter, MD    History of Present Illness:  Wayne Rodriguez is a 71 y.o. male patient who presents to PCP with  Chief Complaint  Patient presents with  . DOT    Pt had vision [facility]       Patient Active Problem List   Diagnosis Date Noted  . PVC's (premature ventricular contractions) 07/17/2015  . Mitral valve prolapse 07/17/2015  . Benign tumor of lung 03/17/2014  . Lung nodule 02/28/2014  . Palpitations   . Decreased exercise tolerance   . Aortic root dilatation ([place]) 07/15/2011  . HTN (hypertension) 07/15/2011  . Chest pain 07/15/2011    Past Medical History:  Diagnosis Date  . Aortic root dilatation ([place])    a. 09/2010 CT: 4.1cm Max Asc Ao diam, 3.7cm Max Desc Ao diam;; s/p MRI/MRA in April 2013  . Back pain   . Cancer ([place])    skin cancer removed from face  . Chest pain    a. 02/2010 Cath: normal  . Decreased exercise tolerance    a. 02/2012  . GERD (gastroesophageal reflux disease)   . HTN (hypertension)   . Mitral valve prolapse   . Palpitations    a. h/o palps r/t caffeine - has occurred again 02/2012  . Pulmonary nodule   . PVC's (premature ventricular contractions) 07/17/2015  . Vertigo    hx of    Past Surgical History:  Procedure Laterality Date  . BACK SURGERY  1983  . CARDIAC CATHETERIZATION     no PCI  . COLONOSCOPY W/ POLYPECTOMY    . [place]   remove foreign body; right  . VIDEO ASSISTED THORACOSCOPY (VATS)/WEDGE RESECTION Right 03/17/2014   Procedure: VIDEO ASSISTED THORACOSCOPY (VATS)/WEDGE RESECTION;  Surgeon: Melrose Nakayama, MD;  Location: [facility];  Service: Thoracic;  Laterality: Right;    Social History  Substance Use Topics  . Smoking status: Former Smoker    Packs/day: 1.00    Years: 30.00    Types: Cigarettes    Start date:  03/08/1962    Quit date: 03/08/1992  . Smokeless tobacco: Former Systems developer    Quit date: 01/06/2013  . Alcohol use No    Family History  Problem Relation Age of Onset  . Lung cancer Mother   . Colon cancer Sister 75    Allergies  Allergen Reactions  . Beta Adrenergic Blockers     Bradycardia   . Codeine     REACTION: nausea/vomiting    Medication list has been reviewed and updated.  Current Outpatient Prescriptions on File Prior to Visit  Medication Sig Dispense Refill  . aspirin 81 MG tablet Take 81 mg by mouth daily.      . Cholecalciferol (VITAMIN D-3 PO) Take 1,000 mg by mouth daily.     . Cyanocobalamin (VITAMIN B-12 PO) Take 1 tablet by mouth daily.     Marland Kitchen lisinopril-hydrochlorothiazide (PRINZIDE,ZESTORETIC) 20-12.5 MG tablet TAKE 2 TABLETS BY MOUTH DAILY. 180 tablet 3  . nitroGLYCERIN (NITROSTAT) 0.4 MG SL tablet Place 1 tablet (0.4 mg total) under the tongue every 5 (five) minutes as needed. 25 tablet 11  . Probiotic Product (PROBIOTIC DAILY PO) Take 1 capsule by mouth daily. Reported on 01/15/2016     No current facility-administered medications  on file prior to visit.     ROS ROS otherwise unremarkable unless listed above.  Physical Examination: BP (!) 144/58   Pulse 84   Temp 98.1 F (36.7 C) (Oral)   Resp 16   Ht 5\' 9"  (1.753 m)   Wt 161 lb (73 kg)   SpO2 100%   BMI 23.78 kg/m  Ideal Body Weight: Weight in (lb) to have BMI = 25: 168.9  Physical Exam  Constitutional: He is oriented to person, place, and time. He appears well-developed and well-nourished. No distress.  HENT:  Head: Normocephalic and atraumatic.  Eyes: Conjunctivae and EOM are normal. Pupils are equal, round, and reactive to light.  Cardiovascular: Normal rate and regular rhythm.  Exam reveals no friction rub.   No murmur heard. Pulmonary/Chest: Effort normal and breath sounds normal. No respiratory distress. He has no wheezes.  Abdominal: Soft. Bowel sounds are normal. He exhibits no  distension. There is no tenderness.  Neurological: He is alert and oriented to person, place, and time.  Skin: Skin is warm and dry. He is not diaphoretic.  Psychiatric: He has a normal mood and affect. His behavior is normal.     Assessment and Plan: Wayne Rodriguez is a 71 y.o. male who is here today for dot 1 year given  Encounter for examination required by Department of Transportation (DOT)  Ivar Drape, PA-C [facility] 3/15/20181:42 PM

## 2017-01-16 NOTE — Patient Instructions (Signed)
     IF you received an x-ray today, you will receive an invoice from [facility]. Please contact  Radiology at [phone] with questions or concerns regarding your invoice.   IF you received labwork today, you will receive an invoice from [facility]. Please contact [facility] at [phone] with questions or concerns regarding your invoice.   Our billing staff will not be able to assist you with questions regarding bills from these companies.  You will be contacted with the lab results as soon as they are available. The fastest way to get your results is to activate your My Chart account. Instructions are located on the last page of this paperwork. If you have not heard from us regarding the results in 2 weeks, please contact this office.     

## 2017-01-24 ENCOUNTER — Other Ambulatory Visit: Payer: Self-pay | Admitting: Thoracic Surgery (Cardiothoracic Vascular Surgery)

## 2017-01-28 ENCOUNTER — Ambulatory Visit
Admission: RE | Admit: 2017-01-28 | Discharge: 2017-01-28 | Disposition: A | Discharge status: Home / Self Care | Payer: Medicare HMO | Source: Ambulatory Visit | Attending: Thoracic Surgery (Cardiothoracic Vascular Surgery) | Admitting: Thoracic Surgery (Cardiothoracic Vascular Surgery)

## 2017-01-28 ENCOUNTER — Encounter: Payer: Self-pay | Admitting: Thoracic Surgery (Cardiothoracic Vascular Surgery)

## 2017-01-28 ENCOUNTER — Ambulatory Visit (INDEPENDENT_AMBULATORY_CARE_PROVIDER_SITE_OTHER): Payer: Medicare HMO | Admitting: Thoracic Surgery (Cardiothoracic Vascular Surgery)

## 2017-01-28 VITALS — BP 138/65 | Resp 16 | Ht 69.0 in | Wt 161.0 lb

## 2017-01-28 DIAGNOSIS — I712 Thoracic aortic aneurysm, without rupture, unspecified: Secondary | ICD-10-CM

## 2017-01-28 DIAGNOSIS — D1431 Benign neoplasm of right bronchus and lung: Secondary | ICD-10-CM | POA: Diagnosis not present

## 2017-01-28 DIAGNOSIS — D381 Neoplasm of uncertain behavior of trachea, bronchus and lung: Secondary | ICD-10-CM

## 2017-01-28 DIAGNOSIS — R918 Other nonspecific abnormal finding of lung field: Secondary | ICD-10-CM | POA: Diagnosis not present

## 2017-01-28 NOTE — Progress Notes (Signed)
[address]       [place] [address]             [phone]    HPI: Mr. Bergen returns for a scheduled follow-up visit.  Mr. Cornelio is a 71 year old man who had a thoracoscopic wedge resection for right upper lobe nodules in 2015. These were inflammatory. No specific diagnosis was ever made even with outside pathologic consultation.  I last saw him in the office in September. His CT in June 2017 showed a new 9 mm left upper lobe nodule. It was unchanged on the CT in September and was not metabolically active. There was some activity in the right paratracheal node, but that node was stable over a long period of time. After discussion he wished to continue to follow this radiographically. He also has a 4.3 cm ascending aneurysm  In the interim since his last visit he's been feeling well. He denies any chest pain or shortness of breath. He hasn't had any change in appetite or weight loss. He denies unusual cough or hemoptysis.  Past Medical History:  Diagnosis Date  . Aortic root dilatation ([place])    a. 09/2010 CT: 4.1cm Max Asc Ao diam, 3.7cm Max Desc Ao diam;; s/p MRI/MRA in April 2013  . Back pain   . Cancer ([place])    skin cancer removed from face  . Chest pain    a. 02/2010 Cath: normal  . Decreased exercise tolerance    a. 02/2012  . GERD (gastroesophageal reflux disease)   . HTN (hypertension)   . Mitral valve prolapse   . Palpitations    a. h/o palps r/t caffeine - has occurred again 02/2012  . Pulmonary nodule   . PVC's (premature ventricular contractions) 07/17/2015  . Vertigo    hx of    Current Outpatient Prescriptions  Medication Sig Dispense Refill  . aspirin 81 MG tablet Take 81 mg by mouth daily.      . Cholecalciferol (VITAMIN D-3 PO) Take 1,000 mg by mouth daily.     . Cyanocobalamin (VITAMIN B-12 PO) Take 1 tablet by mouth daily.     Marland Kitchen lisinopril-hydrochlorothiazide (PRINZIDE,ZESTORETIC) 20-12.5 MG tablet TAKE 2 TABLETS BY MOUTH DAILY. 180  tablet 3  . nitroGLYCERIN (NITROSTAT) 0.4 MG SL tablet Place 1 tablet (0.4 mg total) under the tongue every 5 (five) minutes as needed. 25 tablet 11  . Probiotic Product (PROBIOTIC DAILY PO) Take 1 capsule by mouth daily. Reported on 01/15/2016     No current facility-administered medications for this visit.     Physical Exam BP 138/65 (BP Location: Left Arm, Patient Position: Sitting, Cuff Size: Large)   Resp 16   Ht 5\' 9"  (1.753 m)   Wt 161 lb (73 kg)   SpO2 99% Comment: ON RA  BMI 23.40 kg/m  71 year old man in no acute distress Alert and oriented 3 with no focal deficits No cervical or supraclavicular adenopathy Cardiac regular rate and rhythm with a 2/6 holosystolic murmur Lungs clear with equal breath sounds bilaterally  Diagnostic Tests: CT CHEST WITHOUT CONTRAST  TECHNIQUE: Multidetector CT imaging of the chest was performed following the standard protocol without IV contrast.  COMPARISON:  Chest CT 04/30/2016 and 04/25/2015.  PET-CT 07/30/2016.  FINDINGS: Cardiovascular: Dilatation of the ascending aorta to approximately 4.3 cm is similar to previous CT. There is atherosclerosis of the aorta, great vessels and coronary arteries. No acute vascular findings are seen on noncontrast imaging. The heart size  is normal. There is no pericardial effusion.  Mediastinum/Nodes: There are no enlarged mediastinal, hilar or axillary lymph nodes.Small partially calcified mediastinal lymph nodes are stable, including the 6 mm right paratracheal node which was mildly hypermetabolic on PET-CT. The thyroid gland, trachea and esophagus demonstrate no significant findings.  Lungs/Pleura: There is no pleural effusion. 9 x 5 mm left upper lobe nodule on image 33 is similar to the prior study at which time it measured 10 x 6 mm. There are stable postsurgical changes and scarring in the right upper lobe. No new or enlarging pulmonary nodules are seen.  Upper abdomen: Calcified  splenic granulomas are noted. The visualized liver and adrenal glands appear unremarkable.  Musculoskeletal/Chest wall: There is no chest wall mass or suspicious osseous finding.  IMPRESSION: 1. Left upper lobe nodule has not significantly changed in size from the CT of 9 months ago and is likely postinflammatory. In a low risk patient, consider additional follow-up CT in 1 year. This recommendation follows the consensus statement: Guidelines for Management of Small Pulmonary Nodules Detected on CT Images: From the Fleischner Society 2017; Radiology 2017; 284:228-243. 2. Stable postsurgical changes in the right upper lobe. 3. Stable partially calcified mediastinal lymph nodes, likely postinflammatory. 4. Stable dilatation of the ascending aorta and atherosclerosis. Recommend annual imaging followup by CTA or MRA. This recommendation follows 2010 ACCF/AHA/AATS/ACR/ASA/SCA/SCAI/SIR/STS/SVM Guidelines for the Diagnosis and Management of Patients with Thoracic Aortic Disease. Circulation. 2010; 121: X793-J030   Electronically Signed   By: Richardean Sale M.D.   On: 01/28/2017 14:10 I personally reviewed the CT images and concur with the findings noted above  Impression: Mr. Odea is a 71 year old gentleman who had a wedge resection for 2 right upper lobe nodules that turned out to be inflammatory nodules of unknown etiology back in 2015.  About 9 months ago he was found to have a new left upper lobe nodule. This was not hypermetabolic on PET CT in September so we elected to follow it. It remains unchanged 6 months later. I suspect it is inflammatory and related to whatever process was going on on the right side previously.  He also has an ascending aneurysm that is 4.3 cm in diameter. This is unchanged from his most recent scan as well. That needs continued follow-up.  Plan: Return in one year for CT chest to follow-up ascending aneurysm and left upper lobe nodule.  Melrose Nakayama, MD [facility] [phone]

## 2017-04-04 ENCOUNTER — Other Ambulatory Visit: Payer: Self-pay | Admitting: Cardiology

## 2017-04-04 MED ORDER — LISINOPRIL-HYDROCHLOROTHIAZIDE 20-12.5 MG PO TABS: 2.0000 | ORAL_TABLET | Freq: Every day | ORAL | 12 refills | Status: DC

## 2017-04-04 NOTE — Telephone Encounter (Signed)
Refill sent to the pharmacy electronically.  

## 2017-04-04 NOTE — Telephone Encounter (Signed)
°  New Prob  PT SWITCHED INSURANCE COMPANIES AND IS NOW USING A DIFFERENT PHARMACY   *STAT* If patient is at the pharmacy, call can be transferred to refill team.   1. Which medications need to be refilled? (please list name of each medication and dose if known):  lisinopril-hydrochlorothiazide (PRINZIDE,ZESTORETIC) 20-12.5 MG tablet  2. Which pharmacy/location (including street and city if local pharmacy) is medication to be sent to? [facility] [address] in [place]  3. Do they need a 30 day or 90 day supply? 30 day

## 2017-06-02 DIAGNOSIS — Z0100 Encounter for examination of eyes and vision without abnormal findings: Secondary | ICD-10-CM | POA: Diagnosis not present

## 2017-09-15 ENCOUNTER — Encounter: Payer: Self-pay | Admitting: Cardiology

## 2017-09-25 NOTE — Progress Notes (Signed)
[name] Date of Birth: [birth year] Medical Record #[id number]  History of Present Illness: Wayne Rodriguez is seen for follow up PVCs. He had a cardiac cath 5 years ago due to complaints of palpitations. This showed minor nonobstructive disease. Negative event monitor back last April of 2013. Other issues include HTN. He has a history of mildly dilated aortic root. No change by CT from 2011 to 2015. Last CT in March 2018 showed diameter 4.3 cm. A LUL nodule noted. PET scan was negative for hypermetabolism.   He was seen in May 2016 with complaints of palpitations with exertion or stress. ETT was done which demonstrated PVCs with exertion but was negative for ischemia. Echo showed normal EF with MV prolapse and mild to moderate MR.  On follow up today he is doing very well. He has infrequent symptoms of palpitations. Can go weeks with no symptoms. No  racing or dizziness. Noted some palpitations after drinking coffee and eating chocolate cake at Thanksgiving.  No dyspnea or chest pain. Stays active. 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   Current Outpatient Medications  Medication Sig Dispense Refill  . aspirin 81 MG tablet Take 81 mg by mouth daily.      . Cholecalciferol (VITAMIN D-3 PO) Take 1,000 mg by mouth daily.     . Cyanocobalamin (VITAMIN B-12 PO) Take 1 tablet by mouth daily.     Marland Kitchen lisinopril-hydrochlorothiazide (PRINZIDE,ZESTORETIC) 20-12.5 MG tablet Take 2 tablets by mouth daily. 60 tablet 12  . nitroGLYCERIN (NITROSTAT) 0.4 MG SL tablet Place 1 tablet (0.4 mg total) under the tongue every 5 (five) minutes as needed. 25 tablet 11  . Probiotic Product (PROBIOTIC DAILY PO) Take 1 capsule by mouth daily. Reported on 01/15/2016     No current facility-administered medications for this visit.     Allergies  Allergen Reactions  . Beta Adrenergic Blockers     Bradycardia   . Codeine     REACTION: nausea/vomiting    Past Medical History:    Diagnosis Date  . Aortic root dilatation ([place])    a. 09/2010 CT: 4.1cm Max Asc Ao diam, 3.7cm Max Desc Ao diam;; s/p MRI/MRA in April 2013  . Back pain   . Cancer ([place])    skin cancer removed from face  . Chest pain    a. 02/2010 Cath: normal  . Decreased exercise tolerance    a. 02/2012  . GERD (gastroesophageal reflux disease)   . HTN (hypertension)   . Mitral valve prolapse   . Palpitations    a. h/o palps r/t caffeine - has occurred again 02/2012  . Pulmonary nodule   . PVC's (premature ventricular contractions) 07/17/2015  . Vertigo    hx of    Past Surgical History:  Procedure Laterality Date  . BACK SURGERY  1983  . CARDIAC CATHETERIZATION     no PCI  . COLONOSCOPY W/ POLYPECTOMY    . [place]   remove foreign body; right  . VIDEO ASSISTED THORACOSCOPY (VATS)/WEDGE RESECTION Right 03/17/2014   Procedure: VIDEO ASSISTED THORACOSCOPY (VATS)/WEDGE RESECTION;  Surgeon: Melrose Nakayama, MD;  Location: [facility];  Service: Thoracic;  Laterality: Right;    Social History   Tobacco Use  Smoking Status Former Smoker  . Packs/day: 1.00  . Years: 30.00  . Pack years: 30.00  . Types: Cigarettes  . Start date: 03/08/1962  . Last attempt to quit: 03/08/1992  . Years since quitting:  25.5  Smokeless Tobacco Former Systems developer  . Quit date: 01/06/2013    Social History   Substance and Sexual Activity  Alcohol Use No    Family History  Problem Relation Age of Onset  . Lung cancer Mother   . Colon cancer Sister 62    Review of Systems: The review of systems is per the HPI.  All other systems were reviewed and are negative.  Physical Exam: BP (!) 148/62   Pulse 63   Ht 5\' 9"  (1.753 m)   Wt 155 lb 6.4 oz (70.5 kg)   BMI 22.95 kg/m  GENERAL:  Well appearing WM HEENT:  PERRL, EOMI, sclera are clear. Oropharynx is clear. NECK:  No jugular venous distention, carotid upstroke brisk and symmetric, no bruits, no thyromegaly or adenopathy LUNGS:  Clear to auscultation  bilaterally CHEST:  Unremarkable HEART:  RRR,  PMI not displaced or sustained,S1 and S2 within normal limits, no S3, no S4: no clicks, no rubs, there is a grade 2-3/6 murmur at the apex peaking in mid systole.  ABD:  Soft, nontender. BS +, no masses or bruits. No hepatomegaly, no splenomegaly EXT:  2 + pulses throughout, no edema, no cyanosis no clubbing SKIN:  Warm and dry.  No rashes NEURO:  Alert and oriented x 3. Cranial nerves II through XII intact. PSYCH:  Cognitively intact    Wt Readings from Last 3 Encounters:  09/29/17 155 lb 6.4 oz (70.5 kg)  01/28/17 161 lb (73 kg)  01/16/17 161 lb (73 kg)     LABORATORY DATA:    Lab Results  Component Value Date   WBC 6.3 03/19/2014   HGB 12.3 (L) 03/19/2014   HCT 37.4 (L) 03/19/2014   PLT 131 (L) 03/19/2014   GLUCOSE 120 (H) 03/19/2014   CHOL 151 10/19/2012   TRIG 114.0 10/19/2012   HDL 43.60 10/19/2012   LDLCALC 85 10/19/2012   ALT 9 03/19/2014   AST 17 03/19/2014   NA 140 03/19/2014   K 3.9 03/19/2014   CL 102 03/19/2014   CREATININE 0.78 03/19/2014   BUN 9 03/19/2014   CO2 27 03/19/2014   TSH 1.50 02/17/2012   INR 1.11 03/15/2014   HGBA1C  02/24/2010    5.1 (NOTE)                                                                       According to the ADA Clinical Practice Recommendations for 2011, when HbA1c is used as a screening test:   >=6.5%   Diagnostic of Diabetes Mellitus           (if abnormal result  is confirmed)  5.7-6.4%   Increased risk of developing Diabetes Mellitus  References:Diagnosis and Classification of Diabetes Mellitus,Diabetes QQIW,9798,92(JJHER 1):S62-S69 and Standards of Medical Care in         Diabetes - 2011,Diabetes Care,2011,34  (Suppl 1):S11-S61.   ETT  Study Highlights     There was no ST segment deviation noted during stress.  Blood pressure demonstrated a hypertensive response to exercise.  Negative adequate GXT with the patient exercising to a peak HR 146 (96% APMHR) and  workload of 8.5 mets. No chest pain or ECG changes of ischemia. Patient had an exaggerated BP  response to exercise and occasional isolated PVC's during Stage 2 of exercise.    Echo: 04/12/15:Study Conclusions  - Left ventricle: The cavity size was normal. Systolic function was vigorous. The estimated ejection fraction was in the range of 65% to 70%. Wall motion was normal; there were no regional wall motion abnormalities. Left ventricular diastolic function parameters were normal. - Aortic valve: There was moderate regurgitation. - Mitral valve: There is a prolapse of the posterior mitral valve leaflet associated with anteriorly directed jet of mitral regurgitation. There was mild to moderate regurgitation directed anteriorly. - Right ventricle: Systolic function was normal. - Right atrium: The atrium was normal in size. - Tricuspid valve: There was trivial regurgitation. - Pulmonary arteries: Systolic pressure was within the normal range. - Inferior vena cava: The vessel was normal in size. The respirophasic diameter changes were in the normal range (= 50%), consistent with normal central venous pressure. - Pericardium, extracardiac: There was no pericardial effusion.  Ecg today shows NSR with first degree AV block. Rate 63.  Otherwise normal. I have personally reviewed and interpreted this study.  Assessment / Plan:  1. PVCs.  PVCs are benign based on normal EF and no ischemia. He is minimally symptomatic. Continue observation.    2. MV prolapse- posterior leaflet- mild to moderate by Echo in 2016. Asymptomatic. Will update Echo since it has been 2.5 years since last one.   3. Aortic root aneurysm-  Mild enlargement by CT 4.3 cm. Last CT in March 2018. Followed by Dr. Roxan Hockey.  4. Lung nodules    5. HTN controlled. Will update lab work today  I will follow up in one year.

## 2017-09-29 ENCOUNTER — Encounter: Payer: Self-pay | Admitting: Cardiology

## 2017-09-29 ENCOUNTER — Ambulatory Visit: Payer: Medicare HMO | Admitting: Cardiology

## 2017-09-29 VITALS — BP 148/62 | HR 63 | Ht 69.0 in | Wt 155.4 lb

## 2017-09-29 DIAGNOSIS — I77810 Thoracic aortic ectasia: Secondary | ICD-10-CM

## 2017-09-29 DIAGNOSIS — I10 Essential (primary) hypertension: Secondary | ICD-10-CM | POA: Diagnosis not present

## 2017-09-29 DIAGNOSIS — I493 Ventricular premature depolarization: Secondary | ICD-10-CM | POA: Diagnosis not present

## 2017-09-29 DIAGNOSIS — I341 Nonrheumatic mitral (valve) prolapse: Secondary | ICD-10-CM

## 2017-09-29 NOTE — Addendum Note (Signed)
Addended by: Kathyrn Lass on: 09/29/2017 10:13 AM   Modules accepted: Orders

## 2017-09-29 NOTE — Patient Instructions (Signed)
We will schedule you for an Echocardiogram  Continue your current therapy  I will see you in one year

## 2017-09-30 LAB — LIPID PANEL W/O CHOL/HDL RATIO
CHOLESTEROL TOTAL: 127 mg/dL (ref 100–199)
HDL: 59 mg/dL (ref >39)
LDL CALC: 52 mg/dL (ref 0–99)
Triglycerides: 78 mg/dL (ref 0–149)
VLDL Cholesterol Cal: 16 mg/dL (ref 5–40)

## 2017-09-30 LAB — CBC WITH DIFFERENTIAL/PLATELET
BASOS ABS: 0 10*3/uL (ref 0.0–0.2)
BASOS: 1 %
EOS (ABSOLUTE): 0.1 10*3/uL (ref 0.0–0.4)
Eos: 2 %
HEMOGLOBIN: 15 g/dL (ref 13.0–17.7)
Hematocrit: 44.6 % (ref 37.5–51.0)
IMMATURE GRANS (ABS): 0 10*3/uL (ref 0.0–0.1)
Immature Granulocytes: 0 %
Lymphocytes Absolute: 1.5 10*3/uL (ref 0.7–3.1)
Lymphs: 23 %
MCH: 29.2 pg (ref 26.6–33.0)
MCHC: 33.6 g/dL (ref 31.5–35.7)
MCV: 87 fL (ref 79–97)
MONOCYTES: 9 %
Monocytes Absolute: 0.6 10*3/uL (ref 0.1–0.9)
NEUTROS ABS: 4.2 10*3/uL (ref 1.4–7.0)
Neutrophils: 65 %
Platelets: 147 10*3/uL — ABNORMAL LOW (ref 150–379)
RBC: 5.14 x10E6/uL (ref 4.14–5.80)
RDW: 14 % (ref 12.3–15.4)
WBC: 6.4 10*3/uL (ref 3.4–10.8)

## 2017-09-30 LAB — BASIC METABOLIC PANEL
BUN / CREAT RATIO: 22 (ref 10–24)
BUN: 20 mg/dL (ref 8–27)
CHLORIDE: 101 mmol/L (ref 96–106)
CO2: 30 mmol/L — ABNORMAL HIGH (ref 20–29)
CREATININE: 0.93 mg/dL (ref 0.76–1.27)
Calcium: 9.5 mg/dL (ref 8.6–10.2)
GFR calc Af Amer: 96 mL/min/{1.73_m2} (ref >59)
GFR calc non Af Amer: 83 mL/min/{1.73_m2} (ref >59)
GLUCOSE: 88 mg/dL (ref 65–99)
Potassium: 4.2 mmol/L (ref 3.5–5.2)
SODIUM: 143 mmol/L (ref 134–144)

## 2017-09-30 LAB — HEPATIC FUNCTION PANEL
ALBUMIN: 4.4 g/dL (ref 3.5–4.8)
ALT: 13 IU/L (ref 0–44)
AST: 17 IU/L (ref 0–40)
Alkaline Phosphatase: 58 IU/L (ref 39–117)
BILIRUBIN TOTAL: 0.4 mg/dL (ref 0.0–1.2)
BILIRUBIN, DIRECT: 0.15 mg/dL (ref 0.00–0.40)
Total Protein: 6.6 g/dL (ref 6.0–8.5)

## 2017-10-08 ENCOUNTER — Ambulatory Visit (HOSPITAL_COMMUNITY): Payer: Medicare HMO | Attending: Cardiology

## 2017-10-08 ENCOUNTER — Other Ambulatory Visit: Payer: Self-pay

## 2017-10-08 DIAGNOSIS — I119 Hypertensive heart disease without heart failure: Secondary | ICD-10-CM | POA: Insufficient documentation

## 2017-10-08 DIAGNOSIS — I10 Essential (primary) hypertension: Secondary | ICD-10-CM

## 2017-10-08 DIAGNOSIS — I341 Nonrheumatic mitral (valve) prolapse: Secondary | ICD-10-CM

## 2017-10-08 DIAGNOSIS — I493 Ventricular premature depolarization: Secondary | ICD-10-CM | POA: Insufficient documentation

## 2017-10-08 DIAGNOSIS — I77810 Thoracic aortic ectasia: Secondary | ICD-10-CM

## 2017-10-08 DIAGNOSIS — I080 Rheumatic disorders of both mitral and aortic valves: Secondary | ICD-10-CM | POA: Diagnosis not present

## 2017-12-24 ENCOUNTER — Other Ambulatory Visit: Payer: Self-pay | Admitting: Thoracic Surgery (Cardiothoracic Vascular Surgery)

## 2017-12-24 DIAGNOSIS — I712 Thoracic aortic aneurysm, without rupture, unspecified: Secondary | ICD-10-CM

## 2017-12-24 DIAGNOSIS — I77810 Thoracic aortic ectasia: Secondary | ICD-10-CM

## 2017-12-24 DIAGNOSIS — R911 Solitary pulmonary nodule: Secondary | ICD-10-CM

## 2018-01-16 DIAGNOSIS — H52223 Regular astigmatism, bilateral: Secondary | ICD-10-CM | POA: Diagnosis not present

## 2018-01-16 DIAGNOSIS — H43392 Other vitreous opacities, left eye: Secondary | ICD-10-CM | POA: Diagnosis not present

## 2018-01-16 DIAGNOSIS — H2513 Age-related nuclear cataract, bilateral: Secondary | ICD-10-CM | POA: Diagnosis not present

## 2018-01-16 DIAGNOSIS — H43812 Vitreous degeneration, left eye: Secondary | ICD-10-CM | POA: Diagnosis not present

## 2018-01-16 DIAGNOSIS — H5203 Hypermetropia, bilateral: Secondary | ICD-10-CM | POA: Diagnosis not present

## 2018-01-16 DIAGNOSIS — H3530 Unspecified macular degeneration: Secondary | ICD-10-CM | POA: Diagnosis not present

## 2018-01-16 DIAGNOSIS — H524 Presbyopia: Secondary | ICD-10-CM | POA: Diagnosis not present

## 2018-01-26 ENCOUNTER — Other Ambulatory Visit: Payer: Self-pay | Admitting: Thoracic Surgery (Cardiothoracic Vascular Surgery)

## 2018-01-27 ENCOUNTER — Ambulatory Visit
Admission: RE | Admit: 2018-01-27 | Discharge: 2018-01-27 | Disposition: A | Discharge status: Home / Self Care | Payer: Medicare HMO | Source: Ambulatory Visit | Attending: Thoracic Surgery (Cardiothoracic Vascular Surgery) | Admitting: Thoracic Surgery (Cardiothoracic Vascular Surgery)

## 2018-01-27 ENCOUNTER — Ambulatory Visit: Payer: Medicare HMO | Admitting: Thoracic Surgery (Cardiothoracic Vascular Surgery)

## 2018-01-27 ENCOUNTER — Encounter: Payer: Self-pay | Admitting: Thoracic Surgery (Cardiothoracic Vascular Surgery)

## 2018-01-27 VITALS — BP 140/62 | HR 50 | Resp 20 | Ht 69.0 in | Wt 154.0 lb

## 2018-01-27 DIAGNOSIS — R911 Solitary pulmonary nodule: Secondary | ICD-10-CM | POA: Diagnosis not present

## 2018-01-27 DIAGNOSIS — R918 Other nonspecific abnormal finding of lung field: Secondary | ICD-10-CM

## 2018-01-27 DIAGNOSIS — I712 Thoracic aortic aneurysm, without rupture, unspecified: Secondary | ICD-10-CM

## 2018-01-27 NOTE — Progress Notes (Signed)
[address]       [place] [address]             [phone]       HPI: Mr. Frix returns for a scheduled follow-up visit  Mr. Saini is a 72 year old man with a past history of tobacco abuse (quit in 1993), hypertension, ascending aortic aneurysm, mitral prolapse, inflammatory lung nodules, and vertigo.  I did a wedge resection of the right upper lobe 4 nodules in 2015.  This turned out to be inflammatory.  No specific diagnosis was ever made on pathology.  A CT in June 2017 showed a new 9 mm left upper lobe nodule.  3 months later it was unchanged.  We have been following him for that since then.  He also has a known ascending aortic aneurysm.  This was first discovered in 2011 and was 4.1 cm at that time.  It was 4.3 cm on his most recent visit a year ago.  Over the past year he has been feeling well.  He does get short of breath with heavy exertion, but that has not changed.  He is not having any chest tightness, pressure, or pain.  He had an echocardiogram in December which showed possible worsening of his mitral regurgitation.  He is scheduled to have another echo in June. Past Medical History:  Diagnosis Date  . Aortic root dilatation ([place])    a. 09/2010 CT: 4.1cm Max Asc Ao diam, 3.7cm Max Desc Ao diam;; s/p MRI/MRA in April 2013  . Back pain   . Cancer ([place])    skin cancer removed from face  . Chest pain    a. 02/2010 Cath: normal  . Decreased exercise tolerance    a. 02/2012  . GERD (gastroesophageal reflux disease)   . HTN (hypertension)   . Mitral valve prolapse   . Palpitations    a. h/o palps r/t caffeine - has occurred again 02/2012  . Pulmonary nodule   . PVC's (premature ventricular contractions) 07/17/2015  . Vertigo    hx of    Current Outpatient Medications  Medication Sig Dispense Refill  . aspirin 81 MG tablet Take 81 mg by mouth daily.      . Cholecalciferol (VITAMIN D-3 PO) Take 1,000 mg by mouth daily.     . Cyanocobalamin  (VITAMIN B-12 PO) Take 1 tablet by mouth daily.     Marland Kitchen lisinopril-hydrochlorothiazide (PRINZIDE,ZESTORETIC) 20-12.5 MG tablet Take 2 tablets by mouth daily. 60 tablet 12  . nitroGLYCERIN (NITROSTAT) 0.4 MG SL tablet Place 1 tablet (0.4 mg total) under the tongue every 5 (five) minutes as needed. 25 tablet 11  . Probiotic Product (PROBIOTIC DAILY PO) Take 1 capsule by mouth daily. Reported on 01/15/2016     No current facility-administered medications for this visit.     Physical Exam BP 140/62   Pulse (!) 50   Resp 20   Ht 5\' 9"  (1.753 m)   Wt 154 lb (69.9 kg)   SpO2 99% Comment: RA  BMI 22.29 kg/m  72 year old man in no acute distress Alert and oriented x3 with no focal deficits No cervical or supra clavicular adenopathy Cardiac regular rate and rhythm with a 2/6 systolic murmur Lungs clear, no rales or wheezing No peripheral edema  Diagnostic Tests: CT CHEST WITHOUT CONTRAST  TECHNIQUE: Multidetector CT imaging of the chest was performed following the standard protocol without IV contrast.  COMPARISON:  CT scan 01/28/2017  FINDINGS: Cardiovascular: The  heart is normal in size and stable. No pericardial effusion. Stable fusiform aneurysmal dilatation of the ascending aorta with maximal measurement of 4.3 cm. This is unchanged. Stable aortic and coronary artery calcifications.  Mediastinum/Nodes: No mediastinal or hilar mass or lymphadenopathy. Small scattered lymph nodes are stable. Some of these are calcified.  Lungs/Pleura: Stable surgical changes involving the left upper lobe with mild soft tissue thickening along the suture line. This is unchanged. No findings suspicious for tumor.  Stable underlying emphysematous changes and areas of pulmonary scarring. No interstitial lung disease or bronchiectasis. No worrisome pulmonary lesions. The left upper lobe pulmonary nodule is stable. It measures 9.0 x 5.5 mm.  Upper Abdomen: No significant upper abdominal  findings. Stable calcified splenic granulomas. Mild splenomegaly.  Musculoskeletal: No chest wall mass, supraclavicular or axillary lymphadenopathy. The bony structures are intact. Stable degenerative changes involving the thoracic spine.  IMPRESSION: 1. Stable left upper lobe pulmonary nodule since 2017. 2. Stable surgical changes involving the right upper lobe. No findings suspicious for recurrent tumor, adenopathy or metastatic disease. 3. Stable emphysematous changes and pulmonary scarring. No acute overlying pulmonary process. 4. Stable fusiform aneurysmal dilatation of the ascending aorta with maximum diameter of 4.3 cm.  Aortic Atherosclerosis (ICD10-I70.0) and Emphysema (ICD10-J43.9).   Electronically Signed   By: Marijo Sanes M.D.   On: 01/27/2018 12:50 I personally reviewed the CT images and also reviewed them with Mr. Lobello.  I agree with the findings noted above.  Impression: Mr. Kegley is a 72 year old gentleman with a history of remote tobacco abuse, inflammatory lung nodules, hypertension, mitral prolapse, and an ascending thoracic aortic aneurysm.  Ascending thoracic aortic aneurysm/thoracic aortic atherosclerosis-minimally changed over the past 8 years.  Unchanged over the past 2 years.  Currently 4.3 cm.  Needs continued annual follow-up.  Blood pressure control as the mainstay of treatment.  Hypertension-systolic blood pressure a little elevated today.  He said he had a physical about a week ago and his pressure was 134.  He has a cuff at home but has not been using it.  I recommended that he check himself on a regular basis.  Our goal would be a systolic less than 315 but absolutely less than 140.  Left upper lobe lung nodule-unchanged since 2017.  Likely benign.  Plan: Return in 1 year with CT chest to follow-up ascending aneurysm and lung nodule  Melrose Nakayama, MD [facility] [phone]

## 2018-02-16 ENCOUNTER — Encounter: Payer: Self-pay | Admitting: Gastroenterology

## 2018-02-19 ENCOUNTER — Other Ambulatory Visit: Payer: Self-pay | Admitting: Cardiology

## 2018-03-10 DIAGNOSIS — Z87891 Personal history of nicotine dependence: Secondary | ICD-10-CM | POA: Diagnosis not present

## 2018-03-10 DIAGNOSIS — Z888 Allergy status to other drugs, medicaments and biological substances status: Secondary | ICD-10-CM | POA: Diagnosis not present

## 2018-03-10 DIAGNOSIS — Z7982 Long term (current) use of aspirin: Secondary | ICD-10-CM | POA: Diagnosis not present

## 2018-03-10 DIAGNOSIS — R079 Chest pain, unspecified: Secondary | ICD-10-CM | POA: Diagnosis not present

## 2018-03-10 DIAGNOSIS — I10 Essential (primary) hypertension: Secondary | ICD-10-CM | POA: Diagnosis not present

## 2018-03-10 DIAGNOSIS — Z823 Family history of stroke: Secondary | ICD-10-CM | POA: Diagnosis not present

## 2018-04-05 NOTE — Progress Notes (Signed)
Wayne Rodriguez Date of Birth: 19-Jun-1946 Medical Record #[id number]  History of Present Illness: Wayne Rodriguez is seen for follow up PVCs. He had a cardiac cath 5 years ago due to complaints of palpitations. This showed minor nonobstructive disease. Negative event monitor back last April of 2013. Other issues include HTN. He has a history of mildly dilated aortic root. No change by CT from 2011 to 2015. Last CT in March 2018 showed diameter 4.3 cm. A LUL nodule noted. PET scan was negative for hypermetabolism.   He was seen in May 2016 with complaints of palpitations with exertion or stress. ETT was done which demonstrated PVCs with exertion but was negative for ischemia. Echo showed normal EF with MV prolapse and mild to moderate MR.  He is seen yearly by Dr. Roxan Rodriguez for follow up thoracic aneurysm. Seen March 2019 and stable at 4.3 cm. Also had benign lung nodule   He had follow up echo in December 2018 showing at least moderate eccentric MR. Normal LV function. No pulmonary HTN.   On follow up today he is doing very well. He still has infrequent symptoms of palpitations. This is more noticeable if he hasn't eaten in a while and then eats heavy. Feels like his heart drops down in his stomach and he feels weak. No chest pain or dyspnea. 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 BP at home runs 778-242 systolic.    Current Outpatient Medications  Medication Sig Dispense Refill  . aspirin 81 MG tablet Take 81 mg by mouth daily.      . Cholecalciferol (VITAMIN D-3 PO) Take 1,000 mg by mouth daily.     . Coenzyme Q10 200 MG capsule Take 200 mg by mouth daily.    . Cyanocobalamin (VITAMIN B-12 PO) Take 1 tablet by mouth daily.     Marland Kitchen lisinopril-hydrochlorothiazide (PRINZIDE,ZESTORETIC) 20-12.5 MG tablet TAKE 2 TABLETS BY MOUTH EVERY DAY 60 tablet 3  . Multiple Vitamins-Minerals (PRESERVISION AREDS 2 PO) Take 1 tablet by mouth daily.    . nitroGLYCERIN  (NITROSTAT) 0.4 MG SL tablet Place 1 tablet (0.4 mg total) under the tongue every 5 (five) minutes as needed. 25 tablet 11  . Probiotic Product (PROBIOTIC DAILY PO) Take 1 capsule by mouth daily. Reported on 01/15/2016     No current facility-administered medications for this visit.     Allergies  Allergen Reactions  . Beta Adrenergic Blockers     Bradycardia   . Codeine     REACTION: nausea/vomiting    Past Medical History:  Diagnosis Date  . Aortic root dilatation ([place])    a. 09/2010 CT: 4.1cm Max Asc Ao diam, 3.7cm Max Desc Ao diam;; s/p MRI/MRA in April 2013  . Back pain   . Cancer ([place])    skin cancer removed from face  . Chest pain    a. 02/2010 Cath: normal  . Decreased exercise tolerance    a. 02/2012  . GERD (gastroesophageal reflux disease)   . HTN (hypertension)   . Mitral valve prolapse   . Palpitations    a. h/o palps r/t caffeine - has occurred again 02/2012  . Pulmonary nodule   . PVC's (premature ventricular contractions) 07/17/2015  . Vertigo    hx of    Past Surgical History:  Procedure Laterality Date  . BACK SURGERY  1983  . CARDIAC CATHETERIZATION     no PCI  . COLONOSCOPY W/ POLYPECTOMY    . EYE SURGERY  1997   remove foreign body; right  . VIDEO ASSISTED THORACOSCOPY (VATS)/WEDGE RESECTION Right 03/17/2014   Procedure: VIDEO ASSISTED THORACOSCOPY (VATS)/WEDGE RESECTION;  Surgeon: Melrose Nakayama, MD;  Location: [facility];  Service: Thoracic;  Laterality: Right;    Social History   Tobacco Use  Smoking Status Former Smoker  . Packs/day: 1.00  . Years: 30.00  . Pack years: 30.00  . Types: Cigarettes  . Start date: 03/08/1962  . Last attempt to quit: 03/08/1992  . Years since quitting: 26.1  Smokeless Tobacco Former Systems developer  . Quit date: 01/06/2013    Social History   Substance and Sexual Activity  Alcohol Use No    Family History  Problem Relation Age of Onset  . Lung cancer Mother   . Colon cancer Sister 29    Review of Systems: The  review of systems is per the HPI.  All other systems were reviewed and are negative.  Physical Exam: BP (!) 138/58 (BP Location: Left Arm)   Pulse 64   Ht 5\' 9"  (1.753 m)   Wt 154 lb 6.4 oz (70 kg)   SpO2 99%   BMI 22.80 kg/m  GENERAL:  Well appearing WM in NAD HEENT:  PERRL, EOMI, sclera are clear. Oropharynx is clear. NECK:  No jugular venous distention, carotid upstroke brisk and symmetric, no bruits, no thyromegaly or adenopathy LUNGS:  Clear to auscultation bilaterally CHEST:  Unremarkable HEART:  RRR,  PMI not displaced or sustained,S1 and S2 within normal limits, no S3, no S4: no clicks, no rubs, gr 3/6 harsh systolic murmur at the apex. ABD:  Soft, nontender. BS +, no masses or bruits. No hepatomegaly, no splenomegaly EXT:  2 + pulses throughout, no edema, no cyanosis no clubbing SKIN:  Warm and dry.  No rashes NEURO:  Alert and oriented x 3. Cranial nerves II through XII intact. PSYCH:  Cognitively intact       Wt Readings from Last 3 Encounters:  04/08/18 154 lb 6.4 oz (70 kg)  01/27/18 154 lb (69.9 kg)  09/29/17 155 lb 6.4 oz (70.5 kg)     LABORATORY DATA:    Lab Results  Component Value Date   WBC 6.4 09/29/2017   HGB 15.0 09/29/2017   HCT 44.6 09/29/2017   PLT 147 (L) 09/29/2017   GLUCOSE 88 09/29/2017   CHOL 127 09/29/2017   TRIG 78 09/29/2017   HDL 59 09/29/2017   LDLCALC 52 09/29/2017   ALT 13 09/29/2017   AST 17 09/29/2017   NA 143 09/29/2017   K 4.2 09/29/2017   CL 101 09/29/2017   CREATININE 0.93 09/29/2017   BUN 20 09/29/2017   CO2 30 (H) 09/29/2017   TSH 1.50 02/17/2012   INR 1.11 03/15/2014   HGBA1C  02/24/2010    5.1 (NOTE)                                                                       According to the ADA Clinical Practice Recommendations for 2011, when HbA1c is used as a screening test:   >=6.5%   Diagnostic of Diabetes Mellitus           (if abnormal result  is confirmed)  5.7-6.4%   Increased risk of  developing Diabetes  [place]  References:Diagnosis and Classification of Diabetes Mellitus,Diabetes EXHB,7169,67(ELFYB 1):S62-S69 and Standards of Medical Care in         Diabetes - 2011,Diabetes OFBP,1025,85  (Suppl 1):S11-S61.   ETT  Study Highlights     There was no ST segment deviation noted during stress.  Blood pressure demonstrated a hypertensive response to exercise.  Negative adequate GXT with the patient exercising to a peak HR 146 (96% APMHR) and workload of 8.5 mets. No chest pain or ECG changes of ischemia. Patient had an exaggerated BP response to exercise and occasional isolated PVC's during Stage 2 of exercise.    Echo: 04/12/15:Study Conclusions  - Left ventricle: The cavity size was normal. Systolic function was vigorous. The estimated ejection fraction was in the range of 65% to 70%. Wall motion was normal; there were no regional wall motion abnormalities. Left ventricular diastolic function parameters were normal. - Aortic valve: There was moderate regurgitation. - Mitral valve: There is a prolapse of the posterior mitral valve leaflet associated with anteriorly directed jet of mitral regurgitation. There was mild to moderate regurgitation directed anteriorly. - Right ventricle: Systolic function was normal. - Right atrium: The atrium was normal in size. - Tricuspid valve: There was trivial regurgitation. - Pulmonary arteries: Systolic pressure was within the normal range. - Inferior vena cava: The vessel was normal in size. The respirophasic diameter changes were in the normal range (= 50%), consistent with normal central venous pressure. - Pericardium, extracardiac: There was no pericardial effusion.  Echo 10/08/17: Study Conclusions  - Left ventricle: The cavity size was normal. Wall thickness was   increased in a pattern of mild LVH. Systolic function was normal.   The estimated ejection fraction was in the range of 55% to 60%.   Wall motion was normal;  there were no regional wall motion   abnormalities. Features are consistent with a pseudonormal left   ventricular filling pattern, with concomitant abnormal relaxation   and increased filling pressure (grade 2 diastolic dysfunction). - Aortic valve: There was no stenosis. There was mild   regurgitation. - Mitral valve: Prolapse of a small segment of the posterior   leaflet with very eccentric anteriorly-directed mitral   regurgitation. The mitral regurgitation was at least moderate.   Cannot rule out severe, not fully visualized. - Left atrium: The atrium was mildly dilated. - Right ventricle: The cavity size was normal. Systolic function   was normal. - Right atrium: The atrium was mildly dilated. - Pulmonary arteries: No complete TR doppler jet so unable to   estimate PA systolic pressure. - Inferior vena cava: The vessel was normal in size. The   respirophasic diameter changes were in the normal range (>= 50%),   consistent with normal central venous pressure.  Impressions:  - Normal LV size with mild LV hypertrophy. EF 55-60%. Moderate   diastolic dysfunction. Normal RV size and systolic function.   There was prolapse of a small segment of the posterior leaflet of   the mitral valve with very eccentric anterior mitral   regurgitation. At least moderate, cannot rule out severe. TEE   would be helpful to assess severity of MR.    Assessment / Plan:  1. PVCs.  PVCs are benign based on normal EF and no ischemia. Most likely related to MV prolapse.  He is minimally symptomatic. Continue observation. He has a history of bradycardia on beta blocker therapy.   2. MV prolapse- posterior leaflet- I personally reviewed Echo from December.  He has moderate MR directed eccentrically anteriorly with posterior MV prolapse. LA size is mildly enlarged. Normal LV function. No pulmonary HTN.  Asymptomatic. Will follow Echo yearly. No indication for repair at this time.   3. Aortic root  aneurysm-  Mild enlargement by CT 4.3 cm. Last CT in March 2019. Followed by Dr. Roxan Rodriguez. He does have mild to moderate AI by Echo.   4. Lung nodules    5. HTN controlled.   I will follow up in one year.

## 2018-04-08 ENCOUNTER — Encounter: Payer: Self-pay | Admitting: Cardiology

## 2018-04-08 ENCOUNTER — Ambulatory Visit: Payer: Medicare HMO | Admitting: Cardiology

## 2018-04-08 VITALS — BP 138/58 | HR 64 | Ht 69.0 in | Wt 154.4 lb

## 2018-04-08 DIAGNOSIS — I341 Nonrheumatic mitral (valve) prolapse: Secondary | ICD-10-CM

## 2018-04-08 DIAGNOSIS — I340 Nonrheumatic mitral (valve) insufficiency: Secondary | ICD-10-CM | POA: Diagnosis not present

## 2018-04-08 DIAGNOSIS — I10 Essential (primary) hypertension: Secondary | ICD-10-CM | POA: Diagnosis not present

## 2018-04-08 DIAGNOSIS — I493 Ventricular premature depolarization: Secondary | ICD-10-CM

## 2018-04-08 DIAGNOSIS — I77810 Thoracic aortic ectasia: Secondary | ICD-10-CM

## 2018-04-08 NOTE — Patient Instructions (Signed)
Continue your current therapy  We will see you in one year with Echo.

## 2018-04-16 ENCOUNTER — Other Ambulatory Visit: Payer: Self-pay

## 2018-04-16 DIAGNOSIS — I77810 Thoracic aortic ectasia: Secondary | ICD-10-CM

## 2018-04-16 DIAGNOSIS — I341 Nonrheumatic mitral (valve) prolapse: Secondary | ICD-10-CM

## 2018-06-17 ENCOUNTER — Other Ambulatory Visit: Payer: Self-pay | Admitting: Cardiology

## 2018-06-17 NOTE — Telephone Encounter (Signed)
Rx request sent to pharmacy.  

## 2018-07-10 ENCOUNTER — Encounter: Payer: Self-pay | Admitting: Gastroenterology

## 2018-08-19 ENCOUNTER — Ambulatory Visit (AMBULATORY_SURGERY_CENTER): Payer: Self-pay

## 2018-08-19 ENCOUNTER — Encounter: Payer: Self-pay | Admitting: Gastroenterology

## 2018-08-19 VITALS — Ht 69.0 in | Wt 157.0 lb

## 2018-08-19 DIAGNOSIS — Z86010 Personal history of colonic polyps: Secondary | ICD-10-CM

## 2018-08-19 DIAGNOSIS — Z800 Family history of malignant neoplasm of digestive organs: Secondary | ICD-10-CM

## 2018-08-19 MED ORDER — PEG 3350-KCL-NA BICARB-NACL 420 G PO SOLR: 4000.0000 mL | Freq: Once | ORAL | 0 refills | Status: AC

## 2018-08-19 NOTE — Progress Notes (Signed)
Per pt, no allergies to soy or egg products.Pt not taking any weight loss meds or using  O2 at home.  Pt refused emmi video. 

## 2018-09-02 ENCOUNTER — Ambulatory Visit (AMBULATORY_SURGERY_CENTER): Payer: Medicare HMO | Admitting: Gastroenterology

## 2018-09-02 ENCOUNTER — Encounter: Payer: Self-pay | Admitting: Gastroenterology

## 2018-09-02 VITALS — BP 126/62 | HR 47 | Temp 98.0°F | Resp 10 | Ht 69.0 in | Wt 157.0 lb

## 2018-09-02 DIAGNOSIS — Z800 Family history of malignant neoplasm of digestive organs: Secondary | ICD-10-CM

## 2018-09-02 DIAGNOSIS — D124 Benign neoplasm of descending colon: Secondary | ICD-10-CM | POA: Diagnosis not present

## 2018-09-02 DIAGNOSIS — D122 Benign neoplasm of ascending colon: Secondary | ICD-10-CM

## 2018-09-02 DIAGNOSIS — I10 Essential (primary) hypertension: Secondary | ICD-10-CM | POA: Diagnosis not present

## 2018-09-02 DIAGNOSIS — D120 Benign neoplasm of cecum: Secondary | ICD-10-CM

## 2018-09-02 DIAGNOSIS — Z86010 Personal history of colonic polyps: Secondary | ICD-10-CM | POA: Diagnosis not present

## 2018-09-02 DIAGNOSIS — K649 Unspecified hemorrhoids: Secondary | ICD-10-CM

## 2018-09-02 MED ORDER — SODIUM CHLORIDE 0.9 % IV SOLN: 500.0000 mL | Freq: Once | INTRAVENOUS | Status: DC

## 2018-09-02 NOTE — Progress Notes (Signed)
Called to room to assist during endoscopic procedure.  Patient ID and intended procedure confirmed with present staff. Received instructions for my participation in the procedure from the performing physician.  

## 2018-09-02 NOTE — Op Note (Signed)
[facility] Patient Name: Wayne Rodriguez Procedure Date: 09/02/2018 2:20 PM MRN: [id number] Endoscopist: Milus Banister , MD Age: 72 Referring MD:  Date of Birth: 03/11/1946 Gender: Male Account #: [id number] Procedure:                Colonoscopy Indications:              High risk colon cancer surveillance: Personal                            history of colonic polyps; sister with colon cancer                            in her 75s, colonoscopy 2009 three subCM adenomas,                            colonoscopy 2014 one subCM adenoma Medicines:                Monitored Anesthesia Care Procedure:                Pre-Anesthesia Assessment:                           - Prior to the procedure, a History and Physical                            was performed, and patient medications and                            allergies were reviewed. The patient's tolerance of                            previous anesthesia was also reviewed. The risks                            and benefits of the procedure and the sedation                            options and risks were discussed with the patient.                            All questions were answered, and informed consent                            was obtained. Prior Anticoagulants: The patient has                            taken no previous anticoagulant or antiplatelet                            agents. ASA Grade Assessment: II - A patient with                            mild systemic disease. After reviewing the risks  and benefits, the patient was deemed in                            satisfactory condition to undergo the procedure.                           After obtaining informed consent, the colonoscope                            was passed under direct vision. Throughout the                            procedure, the patient's blood pressure, pulse, and                            oxygen saturations were  monitored continuously. The                            Colonoscope was introduced through the anus and                            advanced to the the cecum, identified by                            appendiceal orifice and ileocecal valve. The                            colonoscopy was performed without difficulty. The                            patient tolerated the procedure well. The quality                            of the bowel preparation was good. The ileocecal                            valve, appendiceal orifice, and rectum were                            photographed. Scope In: 2:27:43 PM Scope Out: 2:44:16 PM Scope Withdrawal Time: 0 hours 13 minutes 38 seconds  Total Procedure Duration: 0 hours 16 minutes 33 seconds  Findings:                 Five sessile polyps were found in the descending                            colon, ascending colon and cecum. The polyps were 3                            to 7 mm in size. These polyps were removed with a                            cold snare. Resection and retrieval were complete.  Internal hemorrhoids were found. The hemorrhoids                            were small.                           The exam was otherwise without abnormality on                            direct and retroflexion views. Complications:            No immediate complications. Estimated blood loss:                            None. Estimated Blood Loss:     Estimated blood loss: none. Impression:               - Five 3 to 7 mm polyps in the descending colon, in                            the ascending colon and in the cecum, removed with                            a cold snare. Resected and retrieved.                           - Internal hemorrhoids.                           - The examination was otherwise normal on direct                            and retroflexion views. Recommendation:           - Patient has a contact number available  for                            emergencies. The signs and symptoms of potential                            delayed complications were discussed with the                            patient. Return to normal activities tomorrow.                            Written discharge instructions were provided to the                            patient.                           - Resume previous diet.                           - Continue present medications.  You will receive a letter within 2-3 weeks with the                            pathology results and my final recommendations.                           If the polyp(s) is proven to be 'pre-cancerous' on                            pathology, you will need repeat colonoscopy in 3-5                            years. Milus Banister, MD 09/02/2018 2:47:11 PM This report has been signed electronically.

## 2018-09-02 NOTE — Progress Notes (Signed)
Pt's states no medical or surgical changes since previsit or office visit. 

## 2018-09-02 NOTE — Progress Notes (Signed)
Report given to PACU, vss 

## 2018-09-02 NOTE — Patient Instructions (Signed)
Handouts:  Polyps and Hemorrhoids  YOU HAD AN ENDOSCOPIC PROCEDURE TODAY AT THE [facility]:   Refer to the procedure report that was given to you for any specific questions about what was found during the examination.  If the procedure report does not answer your questions, please call your gastroenterologist to clarify.  If you requested that your care partner not be given the details of your procedure findings, then the procedure report has been included in a sealed envelope for you to review at your convenience later.  YOU SHOULD EXPECT: Some feelings of bloating in the abdomen. Passage of more gas than usual.  Walking can help get rid of the air that was put into your GI tract during the procedure and reduce the bloating. If you had a lower endoscopy (such as a colonoscopy or flexible sigmoidoscopy) you may notice spotting of blood in your stool or on the toilet paper. If you underwent a bowel prep for your procedure, you may not have a normal bowel movement for a few days.  Please Note:  You might notice some irritation and congestion in your nose or some drainage.  This is from the oxygen used during your procedure.  There is no need for concern and it should clear up in a day or so.  SYMPTOMS TO REPORT IMMEDIATELY:   Following lower endoscopy (colonoscopy or flexible sigmoidoscopy):  Excessive amounts of blood in the stool  Significant tenderness or worsening of abdominal pains  Swelling of the abdomen that is new, acute  Fever of 100F or higher  For urgent or emergent issues, a gastroenterologist can be reached at any hour by calling [phone].   DIET:  We do recommend a small meal at first, but then you may proceed to your regular diet.  Drink plenty of fluids but you should avoid alcoholic beverages for 24 hours.  ACTIVITY:  You should plan to take it easy for the rest of today and you should NOT DRIVE or use heavy machinery until tomorrow (because of the sedation  medicines used during the test).    FOLLOW UP: Our staff will call the number listed on your records the next business day following your procedure to check on you and address any questions or concerns that you may have regarding the information given to you following your procedure. If we do not reach you, we will leave a message.  However, if you are feeling well and you are not experiencing any problems, there is no need to return our call.  We will assume that you have returned to your regular daily activities without incident.  If any biopsies were taken you will be contacted by phone or by letter within the next 1-3 weeks.  Please call us at [phone] if you have not heard about the biopsies in 3 weeks.    SIGNATURES/CONFIDENTIALITY: You and/or your care partner have signed paperwork which will be entered into your electronic medical record.  These signatures attest to the fact that that the information above on your After Visit Summary has been reviewed and is understood.  Full responsibility of the confidentiality of this discharge information lies with you and/or your care-partner. 

## 2018-09-03 ENCOUNTER — Telehealth: Payer: Self-pay

## 2018-09-03 ENCOUNTER — Telehealth: Payer: Self-pay | Admitting: *Deleted

## 2018-09-03 NOTE — Telephone Encounter (Signed)
  Follow up Call-  Call back number 09/02/2018  Post procedure Call Back phone  # [phone]  Permission to leave phone message No  Some recent data might be hidden     Patient questions:  Do you have a fever, pain , or abdominal swelling? No. Pain Score  0 *  Have you tolerated food without any problems? Yes.    Have you been able to return to your normal activities? Yes.    Do you have any questions about your discharge instructions: Diet   No. Medications  No. Follow up visit  No.  Do you have questions or concerns about your Care? No.  Actions: * If pain score is 4 or above: No action needed, pain <4.

## 2018-09-03 NOTE — Telephone Encounter (Signed)
  Follow up Call-  Call back number 09/02/2018  Post procedure Call Back phone  # [phone]  Permission to leave phone message No  Some recent data might be hidden     No answer

## 2018-09-08 ENCOUNTER — Encounter: Payer: Self-pay | Admitting: Gastroenterology

## 2018-10-20 DIAGNOSIS — I2510 Atherosclerotic heart disease of native coronary artery without angina pectoris: Secondary | ICD-10-CM | POA: Diagnosis not present

## 2018-10-20 DIAGNOSIS — Z0000 Encounter for general adult medical examination without abnormal findings: Secondary | ICD-10-CM | POA: Diagnosis not present

## 2018-10-20 DIAGNOSIS — I10 Essential (primary) hypertension: Secondary | ICD-10-CM | POA: Diagnosis not present

## 2018-10-20 DIAGNOSIS — J439 Emphysema, unspecified: Secondary | ICD-10-CM | POA: Diagnosis not present

## 2018-10-20 DIAGNOSIS — I712 Thoracic aortic aneurysm, without rupture: Secondary | ICD-10-CM | POA: Diagnosis not present

## 2018-10-20 DIAGNOSIS — I700 Atherosclerosis of aorta: Secondary | ICD-10-CM | POA: Diagnosis not present

## 2018-11-07 DIAGNOSIS — J209 Acute bronchitis, unspecified: Secondary | ICD-10-CM | POA: Diagnosis not present

## 2018-12-03 ENCOUNTER — Other Ambulatory Visit: Payer: Self-pay | Admitting: *Deleted

## 2018-12-03 DIAGNOSIS — I712 Thoracic aortic aneurysm, without rupture, unspecified: Secondary | ICD-10-CM

## 2018-12-23 ENCOUNTER — Other Ambulatory Visit: Payer: Self-pay

## 2018-12-23 MED ORDER — LISINOPRIL-HYDROCHLOROTHIAZIDE 20-12.5 MG PO TABS: 2.0000 | ORAL_TABLET | Freq: Every day | ORAL | 1 refills | Status: DC

## 2018-12-23 NOTE — Telephone Encounter (Signed)
Rx(s) sent to pharmacy electronically.  

## 2018-12-30 DIAGNOSIS — M25861 Other specified joint disorders, right knee: Secondary | ICD-10-CM | POA: Diagnosis not present

## 2019-01-29 ENCOUNTER — Other Ambulatory Visit: Payer: Medicare HMO

## 2019-02-02 ENCOUNTER — Ambulatory Visit: Payer: Medicare HMO | Admitting: Thoracic Surgery (Cardiothoracic Vascular Surgery)

## 2019-02-02 ENCOUNTER — Other Ambulatory Visit: Payer: Medicare HMO

## 2019-02-15 ENCOUNTER — Other Ambulatory Visit: Payer: Medicare HMO

## 2019-02-16 ENCOUNTER — Ambulatory Visit: Payer: Medicare HMO | Admitting: Thoracic Surgery (Cardiothoracic Vascular Surgery)

## 2019-03-22 DIAGNOSIS — R69 Illness, unspecified: Secondary | ICD-10-CM | POA: Diagnosis not present

## 2019-04-09 ENCOUNTER — Telehealth (HOSPITAL_COMMUNITY): Payer: Self-pay | Admitting: *Deleted

## 2019-04-09 NOTE — Telephone Encounter (Signed)
COVID-19 Pre-Screening Questions:  . Do you currently have a fever? No (yes = cancel and refer to pcp for e-visit) . Have you recently travelled on a cruise, internationally, or to [place], [place], [place], [place], [place], or [place], [place] [employer]) ? NO  (yes = cancel, stay home, monitor symptoms, and contact pcp or initiate e-visit if symptoms develop) . Have you been in contact with someone that is currently pending confirmation of Covid19 testing or has been confirmed to have the [place] virus?  NO (yes = cancel, stay home, away from tested individual, monitor symptoms, and contact pcp or initiate e-visit if symptoms develop) . Are you currently experiencing fatigue or cough? NO (yes = pt should be prepared to have a mask placed at the time of their visit).   . Reiterated no additional visitors. [name] no earlier than 15 minutes before appointment time. . Please bring own mask.   Wayne Rodriguez

## 2019-04-12 ENCOUNTER — Other Ambulatory Visit: Payer: Self-pay

## 2019-04-12 ENCOUNTER — Telehealth: Payer: Self-pay

## 2019-04-12 ENCOUNTER — Ambulatory Visit (HOSPITAL_COMMUNITY): Payer: Medicare HMO | Attending: Cardiology

## 2019-04-12 ENCOUNTER — Encounter (INDEPENDENT_AMBULATORY_CARE_PROVIDER_SITE_OTHER): Payer: Self-pay

## 2019-04-12 DIAGNOSIS — I340 Nonrheumatic mitral (valve) insufficiency: Secondary | ICD-10-CM

## 2019-04-12 DIAGNOSIS — I493 Ventricular premature depolarization: Secondary | ICD-10-CM

## 2019-04-12 DIAGNOSIS — I341 Nonrheumatic mitral (valve) prolapse: Secondary | ICD-10-CM

## 2019-04-12 DIAGNOSIS — I77810 Thoracic aortic ectasia: Secondary | ICD-10-CM

## 2019-04-12 DIAGNOSIS — I10 Essential (primary) hypertension: Secondary | ICD-10-CM

## 2019-04-12 NOTE — Telephone Encounter (Signed)
Called patient with echo results no answer.Alexander.

## 2019-04-13 ENCOUNTER — Ambulatory Visit: Payer: Medicare HMO | Admitting: Thoracic Surgery (Cardiothoracic Vascular Surgery)

## 2019-06-01 ENCOUNTER — Other Ambulatory Visit: Payer: Self-pay

## 2019-06-01 ENCOUNTER — Ambulatory Visit: Payer: Medicare HMO | Admitting: Thoracic Surgery (Cardiothoracic Vascular Surgery)

## 2019-06-01 ENCOUNTER — Ambulatory Visit
Admission: RE | Admit: 2019-06-01 | Discharge: 2019-06-01 | Disposition: A | Discharge status: Home / Self Care | Payer: Medicare HMO | Source: Ambulatory Visit | Attending: Thoracic Surgery (Cardiothoracic Vascular Surgery) | Admitting: Thoracic Surgery (Cardiothoracic Vascular Surgery)

## 2019-06-01 ENCOUNTER — Encounter: Payer: Self-pay | Admitting: Thoracic Surgery (Cardiothoracic Vascular Surgery)

## 2019-06-01 VITALS — BP 148/63 | HR 55 | Temp 97.5°F | Resp 18 | Ht 69.0 in | Wt 151.0 lb

## 2019-06-01 DIAGNOSIS — I712 Thoracic aortic aneurysm, without rupture, unspecified: Secondary | ICD-10-CM

## 2019-06-01 DIAGNOSIS — R911 Solitary pulmonary nodule: Secondary | ICD-10-CM

## 2019-06-01 DIAGNOSIS — J9809 Other diseases of bronchus, not elsewhere classified: Secondary | ICD-10-CM | POA: Diagnosis not present

## 2019-06-01 DIAGNOSIS — J432 Centrilobular emphysema: Secondary | ICD-10-CM | POA: Diagnosis not present

## 2019-06-01 MED ORDER — AMLODIPINE BESYLATE 5 MG PO TABS: 5.0000 mg | ORAL_TABLET | Freq: Every day | ORAL | 2 refills | Status: DC

## 2019-06-01 NOTE — Progress Notes (Signed)
[address]       [place] [address]             [phone]    HPI: Richar Dunklee returns for a scheduled one-year follow-up  Tanvir Hipple is a 73 year old man with a past medical history significant for tobacco abuse (quit 1993), hypertension, ascending aortic aneurysm, mitral regurgitation, inflammatory lung nodules, and vertigo.  Back in 2015 he had a wedge resection of the right upper lobe for what turned out to be inflammatory nodules.  No specific diagnosis was ever given on pathology.  In June 2017 he was noted to have a new 9 mm left upper lobe nodules.  That was followed radiographically and has not changed over time.  He also was noted to have a 4.1 cm ascending aneurysm in 2011.  He has been followed for that as well.  That has changed minimally over time.  Most recently radiology read it is 4.4 cm, my measurement was 4.3 cm.  He says he has been having some headaches recently.  He is not having any chest pain, shortness of breath, orthopnea, or peripheral edema.  His appetite is good.  He had an echocardiogram in June which showed no significant change with his mitral regurgitation.  He also has mild to moderate aortic regurgitation.  Past Medical History:  Diagnosis Date  . Aortic root dilatation ([facility])    a. 09/2010 CT: 4.1cm Max Asc Ao diam, 3.7cm Max Desc Ao diam;; s/p MRI/MRA in April 2013  . Back pain   . Cancer ([facility])    skin cancer removed from face  . Chest pain    a. 02/2010 Cath: normal  . Decreased exercise tolerance    a. 02/2012  . GERD (gastroesophageal reflux disease)   . HTN (hypertension)   . Mitral valve prolapse   . Palpitations    a. h/o palps r/t caffeine - has occurred again 02/2012  . Pulmonary nodule    3 nodules removed over 3 years by Dr Roxan Hockey  . PVC's (premature ventricular contractions) 07/17/2015  . Vertigo    hx of    Current Outpatient Medications  Medication Sig Dispense Refill  . aspirin 81 MG tablet  Take 81 mg by mouth daily.      . Cholecalciferol (VITAMIN D-3 PO) Take 1,000 mg by mouth daily.     . Coenzyme Q10 200 MG capsule Take 200 mg by mouth daily.    . Cyanocobalamin (VITAMIN B-12 PO) Take 1 tablet by mouth daily.     Marland Kitchen lisinopril-hydrochlorothiazide (PRINZIDE,ZESTORETIC) 20-12.5 MG tablet Take 2 tablets by mouth daily. 180 tablet 1  . Multiple Vitamins-Minerals (PRESERVISION AREDS 2 PO) Take 1 tablet by mouth daily.    . nitroGLYCERIN (NITROSTAT) 0.4 MG SL tablet Place 1 tablet (0.4 mg total) under the tongue every 5 (five) minutes as needed. 25 tablet 11  . Probiotic Product (PROBIOTIC DAILY PO) Take 1 capsule by mouth daily. Reported on 01/15/2016    . amLODipine (NORVASC) 5 MG tablet Take 1 tablet (5 mg total) by mouth daily. 90 tablet 2   No current facility-administered medications for this visit.     Physical Exam BP (!) 148/63 (BP Location: Right Arm, Patient Position: Sitting, Cuff Size: Normal)   Pulse (!) 55   Temp (!) 97.5 F (36.4 C)   Resp 18   Ht 5\' 9"  (1.753 m)   Wt 151 lb (68.5 kg)   SpO2 98% Comment: RA  BMI 22.84 kg/m  73 year old man in no acute distress Alert and oriented x3 with no focal deficits No carotid bruits Lungs clear with equal breath sounds bilaterally Cardiac 2/6 holosystolic murmur at apex No peripheral edema  Diagnostic Tests: CT CHEST WITHOUT CONTRAST  TECHNIQUE: Multidetector CT imaging of the chest was performed following the standard protocol without IV contrast.  COMPARISON:  Chest CT 01/27/2018.  FINDINGS: Cardiovascular: Heart size is normal. There is no significant pericardial fluid, thickening or pericardial calcification. There is aortic atherosclerosis, as well as atherosclerosis of the great vessels of the mediastinum and the coronary arteries, including calcified atherosclerotic plaque in the left main, left anterior descending and right coronary arteries. Mild aneurysmal dilatation of the ascending thoracic  aorta (4.5 cm in diameter), minimally increased from 4.4 cm on the prior study.  Mediastinum/Nodes: No pathologically enlarged mediastinal or hilar lymph nodes. Please note that accurate exclusion of hilar adenopathy is limited on noncontrast CT scans. Esophagus is unremarkable in appearance. No axillary lymphadenopathy.  Lungs/Pleura: 9 x 5 mm left upper lobe nodule (axial image 46 of series 8), stable dating back to 2017. New 4 mm right upper lobe nodule (axial image 77 of series 8). No other larger more suspicious appearing pulmonary nodules or masses are noted. Suture line in the right upper lobe from prior wedge resection. No acute consolidative airspace disease. No pleural effusions. Diffuse bronchial wall thickening with mild centrilobular and paraseptal emphysema.  Upper Abdomen: Numerous calcified granulomas throughout the spleen. Aortic atherosclerosis.  Musculoskeletal: There are no aggressive appearing lytic or blastic lesions noted in the visualized portions of the skeleton.  IMPRESSION: 1. Minimal increase in size of ascending thoracic aortic aneurysm which currently measures 4.5 cm in diameter (previously 4.4 cm). Ascending thoracic aortic aneurysm. Recommend semi-annual imaging followup by CTA or MRA and referral to cardiothoracic surgery if not already obtained. This recommendation follows 2010 ACCF/AHA/AATS/ACR/ASA/SCA/SCAI/SIR/STS/SVM Guidelines for the Diagnosis and Management of Patients With Thoracic Aortic Disease. Circulation. 2010; 121: Z610-R604. Aortic aneurysm NOS (ICD10-I71.9). 2. Previously noted left upper lobe pulmonary nodule is stable dating back to 2017, considered benign. However, there is a new 4 mm right upper lobe pulmonary nodule (axial image 77 of series 8). This is nonspecific, but attention at time of follow-up examination is recommended to ensure stability of this finding. 3. Mild diffuse bronchial wall thickening with mild  centrilobular and paraseptal emphysema; imaging findings suggestive of underlying COPD. 4. Aortic atherosclerosis, in addition to left main and 2 vessel coronary artery disease. Please note that although the presence of coronary artery calcium documents the presence of coronary artery disease, the severity of this disease and any potential stenosis cannot be assessed on this non-gated CT examination. Assessment for potential risk factor modification, dietary therapy or pharmacologic therapy may be warranted, if clinically indicated.  Aortic aneurysm NOS (ICD10-I71.9), Aortic Atherosclerosis (ICD10-I70.0) and Emphysema (ICD10-J43.9).   Electronically Signed   By: Vinnie Langton M.D.   On: 06/01/2019 09:43  I personally reviewed the CT images and concur with the findings noted above  Impression: Azavion Bouillon is a 74 year old man with a past history significant for remote tobacco abuse, hypertension, ascending aortic aneurysm, mitral regurgitation, inflammatory lung nodules, and vertigo.  Ascending aneurysm-again when I measured the aneurysm I am getting about 4.3cm possibly up to 4.4.  Official reading was 4.5 cm.  Minimal if any growth over the past year.  Now meets criteria for semiannual follow-up.  Right upper lobe lung nodule-new 4 mm nodule.  Will  reassess in 4 months.  Left upper lobe nodule-stable for 3 years.  Consistent with benign nodule.  Has history of benign inflammatory nodules.  Hypertension-blood pressure elevated.  He has a tendency towards bradycardia.  He is not a candidate for beta-blockers.  I am can add Norvasc 5mg  p.o. daily to his current regimen.  Plan: Norvasc 5 mg daily Return in 6 months with CT Angio of chest  Melrose Nakayama, MD [facility] [phone]

## 2019-06-18 ENCOUNTER — Other Ambulatory Visit: Payer: Self-pay | Admitting: Cardiology

## 2019-07-26 ENCOUNTER — Other Ambulatory Visit: Payer: Self-pay | Admitting: Cardiology

## 2019-09-01 DIAGNOSIS — H524 Presbyopia: Secondary | ICD-10-CM | POA: Diagnosis not present

## 2019-09-01 DIAGNOSIS — H2512 Age-related nuclear cataract, left eye: Secondary | ICD-10-CM | POA: Diagnosis not present

## 2019-09-01 DIAGNOSIS — Z961 Presence of intraocular lens: Secondary | ICD-10-CM | POA: Diagnosis not present

## 2019-09-01 DIAGNOSIS — H52223 Regular astigmatism, bilateral: Secondary | ICD-10-CM | POA: Diagnosis not present

## 2019-09-01 DIAGNOSIS — Z9841 Cataract extraction status, right eye: Secondary | ICD-10-CM | POA: Diagnosis not present

## 2019-09-01 DIAGNOSIS — H5203 Hypermetropia, bilateral: Secondary | ICD-10-CM | POA: Diagnosis not present

## 2019-09-03 ENCOUNTER — Other Ambulatory Visit: Payer: Self-pay | Admitting: Cardiology

## 2019-09-26 ENCOUNTER — Other Ambulatory Visit: Payer: Self-pay | Admitting: Cardiology

## 2019-10-21 ENCOUNTER — Other Ambulatory Visit: Payer: Self-pay | Admitting: Thoracic Surgery (Cardiothoracic Vascular Surgery)

## 2019-10-21 DIAGNOSIS — I712 Thoracic aortic aneurysm, without rupture, unspecified: Secondary | ICD-10-CM

## 2019-10-25 ENCOUNTER — Other Ambulatory Visit (HOSPITAL_COMMUNITY): Payer: Medicare HMO

## 2019-10-26 ENCOUNTER — Other Ambulatory Visit: Payer: Self-pay | Admitting: Thoracic Surgery (Cardiothoracic Vascular Surgery)

## 2019-10-28 ENCOUNTER — Other Ambulatory Visit: Payer: Self-pay | Admitting: Cardiology

## 2019-11-16 ENCOUNTER — Other Ambulatory Visit: Payer: Self-pay | Admitting: Thoracic Surgery (Cardiothoracic Vascular Surgery)

## 2019-11-16 DIAGNOSIS — R911 Solitary pulmonary nodule: Secondary | ICD-10-CM

## 2019-11-17 DIAGNOSIS — R911 Solitary pulmonary nodule: Secondary | ICD-10-CM | POA: Diagnosis not present

## 2019-11-17 LAB — BUN: BUN: 17 mg/dL (ref 7–25)

## 2019-11-17 LAB — CREATININE, SERUM: Creat: 0.99 mg/dL (ref 0.70–1.18)

## 2019-11-18 ENCOUNTER — Ambulatory Visit: Payer: Medicare HMO | Admitting: Physician Assistant

## 2019-11-18 ENCOUNTER — Encounter (INDEPENDENT_AMBULATORY_CARE_PROVIDER_SITE_OTHER): Payer: Self-pay

## 2019-11-18 ENCOUNTER — Ambulatory Visit
Admission: RE | Admit: 2019-11-18 | Discharge: 2019-11-18 | Disposition: A | Discharge status: Home / Self Care | Payer: Medicare HMO | Source: Ambulatory Visit | Attending: Thoracic Surgery (Cardiothoracic Vascular Surgery) | Admitting: Thoracic Surgery (Cardiothoracic Vascular Surgery)

## 2019-11-18 ENCOUNTER — Other Ambulatory Visit: Payer: Self-pay

## 2019-11-18 VITALS — BP 130/68 | HR 58 | Temp 97.9°F | Ht 69.0 in | Wt 156.0 lb

## 2019-11-18 DIAGNOSIS — I2510 Atherosclerotic heart disease of native coronary artery without angina pectoris: Secondary | ICD-10-CM | POA: Diagnosis not present

## 2019-11-18 DIAGNOSIS — I712 Thoracic aortic aneurysm, without rupture, unspecified: Secondary | ICD-10-CM

## 2019-11-18 DIAGNOSIS — I2584 Coronary atherosclerosis due to calcified coronary lesion: Secondary | ICD-10-CM

## 2019-11-18 DIAGNOSIS — R911 Solitary pulmonary nodule: Secondary | ICD-10-CM | POA: Diagnosis not present

## 2019-11-18 DIAGNOSIS — I10 Essential (primary) hypertension: Secondary | ICD-10-CM | POA: Diagnosis not present

## 2019-11-18 DIAGNOSIS — I341 Nonrheumatic mitral (valve) prolapse: Secondary | ICD-10-CM

## 2019-11-18 DIAGNOSIS — I493 Ventricular premature depolarization: Secondary | ICD-10-CM

## 2019-11-18 MED ORDER — LISINOPRIL-HYDROCHLOROTHIAZIDE 20-12.5 MG PO TABS: 2.0000 | ORAL_TABLET | Freq: Every day | ORAL | 3 refills | Status: AC

## 2019-11-18 MED ORDER — IOPAMIDOL (ISOVUE-370) INJECTION 76%
75.0000 mL | Freq: Once | INTRAVENOUS | Status: AC | PRN
  Administered 2019-11-18: 75 mL via INTRAVENOUS

## 2019-11-18 NOTE — Patient Instructions (Signed)
Medication Instructions:  Your physician recommends that you continue on your current medications as directed. Please refer to the Current Medication list given to you today.  *If you need a refill on your cardiac medications before your next appointment, please call your pharmacy*  Lab Work: NONE ordered at this time of appointment   If you have labs (blood work) drawn today and your tests are completely normal, you will receive your results only by: Marland Kitchen MyChart Message (if you have MyChart) OR . A paper copy in the mail If you have any lab test that is abnormal or we need to change your treatment, we will call you to review the results.  Testing/Procedures: NONE ordered at this time of appointment   Follow-Up: At [facility], you and your health needs are our priority.  As part of our continuing mission to provide you with exceptional heart care, we have created designated Provider Care Teams.  These Care Teams include your primary Cardiologist (physician) and Advanced Practice Providers (APPs -  Physician Assistants and Nurse Practitioners) who all work together to provide you with the care you need, when you need it.  Your next appointment:   12 month(s)  The format for your next appointment:   In Person  Provider:   Peter Martinique, MD  Other Instructions

## 2019-11-18 NOTE — Progress Notes (Signed)
Cardiology Office Note:    Date:  11/20/2019   ID:  Wayne Rodriguez, DOB [birth year], MRN [id number]  PCP:  Wayne Melter, MD  Cardiologist:  Wayne Martinique, MD  Electrophysiologist:  None   Referring MD: Wayne Melter, MD   Chief Complaint  Patient presents with  . Follow-up    seen for Dr. Martinique.    History of Present Illness:    [name] is a 74 y.o. male with a hx of PVCs, aortic root dilatation, hypertension, coronary artery calcification, mitral valve prolapse and history of lung nodule.  Previous cardiac catheterization on 02/26/2010 demonstrated right dominant system with mild irregularities however no significant stenosis.  Last echocardiogram obtained on 04/12/2019 showed EF 60 to 65%, mild basal septal hypertrophy, mild posterior leaflet mitral valve prolapse, myxomatous mitral valve with mild thickening of leaflet, mild to moderate MR, mild dilatation of the ascending aorta measuring at 41 mm.  CT of the chest obtained on 06/01/2019 showed minimal increase in the ascending thoracic aortic aneurysm measuring at 4.5 cm, new 4 mm right upper lobe pulmonary nodule, aortic atherosclerosis and two-vessel coronary artery calcification.  He was seen by Dr. Roxan Rodriguez in July 2020, Norvasc 5 mg daily was added to help with blood pressure control.  CTA of the chest recommended in 6 months.  Patient presents today for cardiology follow-up.  His blood pressure is very well controlled on the current therapy.  He need a refill for his lisinopril-hydrochlorothiazide.  He denies any recent chest pain or shortness of breath.  He was digging holes for fence post recently without any exertional chest pain or shortness of breath.  He does not have any lower extremity edema, orthopnea or PND.  On physical exam, I could not appreciate mitral valve murmur.  He can follow-up in 1 year with Dr. Martinique, I will defer to Dr. Martinique to consider a repeat echocardiogram at that time.   Past Medical  History:  Diagnosis Date  . Aortic root dilatation ([place])    a. 09/2010 CT: 4.1cm Max Asc Ao diam, 3.7cm Max Desc Ao diam;; s/p MRI/MRA in April 2013  . Back pain   . Cancer ([place])    skin cancer removed from face  . Chest pain    a. 02/2010 Cath: normal  . Decreased exercise tolerance    a. 02/2012  . GERD (gastroesophageal reflux disease)   . HTN (hypertension)   . Mitral valve prolapse   . Palpitations    a. h/o palps r/t caffeine - has occurred again 02/2012  . Pulmonary nodule    3 nodules removed over 3 years by Dr Wayne Rodriguez  . PVC's (premature ventricular contractions) 07/17/2015  . Vertigo    hx of    Past Surgical History:  Procedure Laterality Date  . BACK SURGERY  1983  . CARDIAC CATHETERIZATION     no PCI  . COLONOSCOPY W/ POLYPECTOMY    . EYE SURGERY  1997   remove foreign body; right/right eye is blurry  . VIDEO ASSISTED THORACOSCOPY (VATS)/WEDGE RESECTION Right 03/17/2014   Procedure: VIDEO ASSISTED THORACOSCOPY (VATS)/WEDGE RESECTION;  Surgeon: Wayne Nakayama, MD;  Location: [facility];  Service: Thoracic;  Laterality: Right;    Current Medications: No outpatient medications have been marked as taking for the 11/18/19 encounter (Office Visit) with Wayne Rodriguez, [place].     Allergies:   Beta adrenergic blockers and Codeine   Social History   Socioeconomic History  . Marital status: Married  Spouse name: Not on file  . Number of children: Not on file  . Years of education: Not on file  . Highest education level: Not on file  Occupational History  . Not on file  Tobacco Use  . Smoking status: Former Smoker    Packs/day: 1.00    Years: 30.00    Pack years: 30.00    Types: Cigarettes    Start date: 03/08/1962    Quit date: 03/08/1992    Years since quitting: 27.7  . Smokeless tobacco: Former Systems developer    Types: Snuff, Sarina Ser    Quit date: 01/06/2013  Substance and Sexual Activity  . Alcohol use: No  . Drug use: No  . Sexual activity: Not Currently  Other Topics  Concern  . Not on file  Social History Narrative  . Not on file   Social Determinants of Health   Financial Resource Strain:   . Difficulty of Paying Living Expenses: Not on file  Food Insecurity:   . Worried About Charity fundraiser in the Last Year: Not on file  . Ran Out of Food in the Last Year: Not on file  Transportation Needs:   . Lack of Transportation (Medical): Not on file  . Lack of Transportation (Non-Medical): Not on file  Physical Activity:   . Days of Exercise per Week: Not on file  . Minutes of Exercise per Session: Not on file  Stress:   . Feeling of Stress : Not on file  Social Connections:   . Frequency of Communication with Friends and Family: Not on file  . Frequency of Social Gatherings with Friends and Family: Not on file  . Attends Religious Services: Not on file  . Active Member of Clubs or Organizations: Not on file  . Attends Archivist Meetings: Not on file  . Marital Status: Not on file     Family History: The patient's family history includes Colon cancer (age of onset: 67) in his sister; Lung cancer in his mother.  ROS:   Please see the history of present illness.     All other systems reviewed and are negative.  EKGs/Labs/Other Studies Reviewed:    The following studies were reviewed today:  Echo 04/12/2019 IMPRESSIONS    1. The left ventricle has normal systolic function with an ejection fraction of 60-65%. The cavity size was normal. Mild basal septal hypertrophy. Left ventricular diastolic parameters were normal. No evidence of left ventricular regional wall motion  abnormalities.  2. The right ventricle has normal systolic function. The cavity was normal. There is no increase in right ventricular wall thickness.  3. Mild posterior leaflet mitral valve prolapse.  4. The mitral valve is myxomatous. Mild thickening of the mitral valve leaflet. Mitral valve regurgitation is mild to moderate by color flow Doppler. The MR jet is  eccentric anteriorly directed.  5. The aortic valve is tricuspid. Mild sclerosis of the aortic valve. Aortic valve regurgitation is mild to moderate by color flow Doppler.  6. There is mild dilatation of the ascending aorta measuring 41 mm.   CT of chest 06/01/2019 IMPRESSION: 1. Minimal increase in size of ascending thoracic aortic aneurysm which currently measures 4.5 cm in diameter (previously 4.4 cm). Ascending thoracic aortic aneurysm. Recommend semi-annual imaging followup by CTA or MRA and referral to cardiothoracic surgery if not already obtained. This recommendation follows 2010 ACCF/AHA/AATS/ACR/ASA/SCA/SCAI/SIR/STS/SVM Guidelines for the Diagnosis and Management of Patients With Thoracic Aortic Disease. Circulation. 2010; [id number]:9224643. Aortic aneurysm NOS (ICD10-I71.9).  2. Previously noted left upper lobe pulmonary nodule is stable dating back to 2017, considered benign. However, there is a new 4 mm right upper lobe pulmonary nodule (axial image 77 of series 8). This is nonspecific, but attention at time of follow-up examination is recommended to ensure stability of this finding. 3. Mild diffuse bronchial wall thickening with mild centrilobular and paraseptal emphysema; imaging findings suggestive of underlying COPD. 4. Aortic atherosclerosis, in addition to left main and 2 vessel coronary artery disease. Please note that although the presence of coronary artery calcium documents the presence of coronary artery disease, the severity of this disease and any potential stenosis cannot be assessed on this non-gated CT examination. Assessment for potential risk factor modification, dietary therapy or pharmacologic therapy may be warranted, if clinically indicated.   EKG:  EKG is ordered today.  The ekg ordered today demonstrates normal sinus rhythm, first-degree AV block.  Recent Labs: 11/17/2019: BUN 17; Creat 0.99  Recent Lipid Panel    Component Value Date/Time    CHOL 127 09/29/2017 1009   TRIG 78 09/29/2017 1009   HDL 59 09/29/2017 1009   CHOLHDL 3 10/19/2012 0844   VLDL 22.8 10/19/2012 0844   LDLCALC 52 09/29/2017 1009    Physical Exam:    VS:  BP 130/68   Pulse (!) 58   Temp 97.9 F (36.6 C)   Ht 5\' 9"  (1.753 m)   Wt 156 lb (70.8 kg)   BMI 23.04 kg/m     Wt Readings from Last 3 Encounters:  11/18/19 156 lb (70.8 kg)  06/01/19 151 lb (68.5 kg)  09/02/18 157 lb (71.2 kg)     GEN:  Well nourished, well developed in no acute distress HEENT: Normal NECK: No JVD; No carotid bruits LYMPHATICS: No lymphadenopathy CARDIAC: RRR, no murmurs, rubs, gallops RESPIRATORY:  Clear to auscultation without rales, wheezing or rhonchi  ABDOMEN: Soft, non-tender, non-distended MUSCULOSKELETAL:  No edema; No deformity  SKIN: Warm and dry NEUROLOGIC:  Alert and oriented x 3 PSYCHIATRIC:  Normal affect   ASSESSMENT:    1. Coronary artery calcification   2. Essential hypertension   3. Thoracic aortic aneurysm without rupture ([place])   4. PVC's (premature ventricular contractions)   5. Mitral valve prolapse   6. Lung nodule    PLAN:    In order of problems listed above:  1. Coronary artery calcification: Seen on last CT of chest in July 2020.  Denies any obvious chest discomfort  2. Hypertension: Blood pressure well controlled on current therapy.  Will refill lisinopril-hydrochlorothiazide  3. Thoracic aortic aneurysm: Followed by Dr. Roxan Rodriguez.  4. History of PVCs: No significant palpitation recently  5. Mitral valve prolapse: Seen on previous echocardiogram, consider repeat echocardiogram on the next follow-up  6. Lung nodule: New lung nodule noted on the most recent CT of the chest.  He is getting regular CT for thoracic aortic aneurysm as well.   Medication Adjustments/Labs and Tests Ordered: Current medicines are reviewed at length with the patient today.  Concerns regarding medicines are outlined above.  Orders Placed This  Encounter  Procedures  . EKG 12-Lead   Meds ordered this encounter  Medications  . lisinopril-hydrochlorothiazide (ZESTORETIC) 20-12.5 MG tablet    Sig: Take 2 tablets by mouth daily.    Dispense:  180 tablet    Refill:  3    Patient Instructions  Medication Instructions:  Your physician recommends that you continue on your current medications as directed. Please refer to the Current  Medication list given to you today.  *If you need a refill on your cardiac medications before your next appointment, please call your pharmacy*  Lab Work: NONE ordered at this time of appointment   If you have labs (blood work) drawn today and your tests are completely normal, you will receive your results only by: Marland Kitchen MyChart Message (if you have MyChart) OR . A paper copy in the mail If you have any lab test that is abnormal or we need to change your treatment, we will call you to review the results.  Testing/Procedures: NONE ordered at this time of appointment   Follow-Up: At [facility], you and your health needs are our priority.  As part of our continuing mission to provide you with exceptional heart care, we have created designated Provider Care Teams.  These Care Teams include your primary Cardiologist (physician) and Advanced Practice Providers (APPs -  Physician Assistants and Nurse Practitioners) who all work together to provide you with the care you need, when you need it.  Your next appointment:   12 month(s)  The format for your next appointment:   In Person  Provider:   Peter Martinique, MD  Other Instructions      Signed, Wayne Rodriguez, [place]  11/20/2019 11:16 PM    [place]

## 2019-11-20 ENCOUNTER — Encounter: Payer: Self-pay | Admitting: Physician Assistant

## 2019-11-22 ENCOUNTER — Other Ambulatory Visit: Payer: Self-pay | Admitting: Thoracic Surgery (Cardiothoracic Vascular Surgery)

## 2019-11-22 DIAGNOSIS — R911 Solitary pulmonary nodule: Secondary | ICD-10-CM

## 2019-11-23 ENCOUNTER — Ambulatory Visit: Payer: Medicare HMO | Admitting: Thoracic Surgery (Cardiothoracic Vascular Surgery)

## 2019-11-23 ENCOUNTER — Other Ambulatory Visit: Payer: Self-pay | Admitting: Cardiology

## 2019-11-23 ENCOUNTER — Encounter: Payer: Self-pay | Admitting: Thoracic Surgery (Cardiothoracic Vascular Surgery)

## 2019-11-23 ENCOUNTER — Other Ambulatory Visit: Payer: Self-pay

## 2019-11-23 VITALS — BP 145/67 | HR 66 | Temp 97.7°F | Resp 20 | Ht 69.0 in | Wt 156.0 lb

## 2019-11-23 DIAGNOSIS — I712 Thoracic aortic aneurysm, without rupture, unspecified: Secondary | ICD-10-CM

## 2019-11-23 NOTE — Progress Notes (Signed)
[address]       [place] [address]             [phone]       HPI: [name] returns for scheduled follow-up visit  Veikko Belotti is a 74 year old man with a history of tobacco abuse (quit 1993), hypertension, ascending aneurysm, mitral regurgitation, inflammatory lung nodules, and vertigo.    In 2015 I did a wedge resection of right upper lobe nodules that turned out to be inflammatory in nature.  No specific diagnosis was rendered.  He was found to have a new 9 mm left upper lobe nodule in June 2017.  It remained stable over time.  In 2011 he was noted to have a 4.1 cm ascending aneurysm.  That it changed minimally over time measuring about 4.3 cm.  On his scan 6 months ago the radiologist read it at 4.5 cm.  Although I felt it was still closer to 4.3, we decided to do a repeat scan at 6 months rather than waiting a year just to ensure that there was not ongoing enlargement.  He is feeling well.  He remains very active.  He planted 18 bushes behind his house over the weekend.  He still works on large trucks.  He is not having any chest pain, pressure, tightness, or shortness of breath.  Past Medical History:  Diagnosis Date  . Aortic root dilatation ([place])    a. 09/2010 CT: 4.1cm Max Asc Ao diam, 3.7cm Max Desc Ao diam;; s/p MRI/MRA in April 2013  . Back pain   . Cancer ([place])    skin cancer removed from face  . Chest pain    a. 02/2010 Cath: normal  . Decreased exercise tolerance    a. 02/2012  . GERD (gastroesophageal reflux disease)   . HTN (hypertension)   . Mitral valve prolapse   . Palpitations    a. h/o palps r/t caffeine - has occurred again 02/2012  . Pulmonary nodule    3 nodules removed over 3 years by Dr Roxan Hockey  . PVC's (premature ventricular contractions) 07/17/2015  . Vertigo    hx of     Current Outpatient Medications  Medication Sig Dispense Refill  . amLODipine (NORVASC) 5 MG tablet Take 1 tablet (5 mg total) by mouth  daily. 90 tablet 2  . aspirin 81 MG tablet Take 81 mg by mouth daily.      . Cholecalciferol (VITAMIN D-3 PO) Take 1,000 mg by mouth daily.     . Coenzyme Q10 200 MG capsule Take 200 mg by mouth daily.    . Cyanocobalamin (VITAMIN B-12 PO) Take 1 tablet by mouth daily.     Marland Kitchen lisinopril-hydrochlorothiazide (ZESTORETIC) 20-12.5 MG tablet Take 2 tablets by mouth daily. 180 tablet 3  . Multiple Vitamins-Minerals (PRESERVISION AREDS 2 PO) Take 1 tablet by mouth daily.    . nitroGLYCERIN (NITROSTAT) 0.4 MG SL tablet Place 1 tablet (0.4 mg total) under the tongue every 5 (five) minutes as needed. 25 tablet 11  . Probiotic Product (PROBIOTIC DAILY PO) Take 1 capsule by mouth daily. Reported on 01/15/2016     No current facility-administered medications for this visit.    Physical Exam BP (!) 145/67   Pulse 66   Temp 97.7 F (36.5 C) (Skin)   Resp 20   Ht 5\' 9"  (1.753 m)   Wt 156 lb (70.8 kg)   SpO2 98% Comment: RA  BMI 23.66 kg/m  74 year old  man in no acute distress Alert and oriented x3 with no focal deficits No carotid bruits Cardiac regular rate and rhythm with a 2/6 systolic murmur at the apex Lungs clear with equal breath sounds bilaterally No edema  Diagnostic Tests: CT ANGIOGRAPHY CHEST WITH CONTRAST  TECHNIQUE: Multidetector CT imaging of the chest was performed using the standard protocol during bolus administration of intravenous contrast. Multiplanar CT image reconstructions and MIPs were obtained to evaluate the vascular anatomy.  CONTRAST:  32mL ISOVUE-370 IOPAMIDOL (ISOVUE-370) INJECTION 76%  COMPARISON:  CT, 06/01/2019 and older exams dating back to 09/24/2010.  FINDINGS: Cardiovascular: Heart is normal in size. No pericardial effusion. Ascending thoracic aorta measures 4.2 cm. Aorta measured at 4.5 cm on the prior exam. This difference appears to be differences in measurement technique only. CT chest from 04/25/2015 measured aorta at 4.3 cm. Aortic  atherosclerosis is stable. No dissection.  Mediastinum/Nodes: No neck base, axillary, mediastinal or hilar masses or enlarged lymph nodes. Trachea and esophagus are unremarkable.  Lungs/Pleura: Pulmonary anastomosis staples and postsurgical scarring noted in the right upper lobe extending to the anterior and lateral pleural margins. This is stable.  9 x 4 mm, mean 6 mm, nodule in the left upper lobe, image 36, series 7, stable. Remainder of the lungs is clear. Lungs are hyperexpanded.  No pleural effusion or pneumothorax.  Upper Abdomen: No acute findings.  Musculoskeletal: No fracture or acute finding.  No bone lesion.  Review of the MIP images confirms the above findings.  IMPRESSION: 1. Stable dilated ascending thoracic aorta. Currently this measures 4.2 cm. The difference between the current measurement and prior studies dating back to 2016 appears to be technical only. No convincing change. Recommend annual imaging followup by CTA or MRA. This recommendation follows 2010 ACCF/AHA/AATS/ACR/ASA/SCA/SCAI/SIR/STS/SVM Guidelines for the Diagnosis and Management of Patients with Thoracic Aortic Disease. Circulation. 2010; [id number]:9224643. Aortic aneurysm NOS (ICD10-I71.9) 2. No acute findings. 3. Stable mean size 6 mm left upper lobe nodule since the exams dating back to March 2018. This is benign. The small right upper lobe nodule noted on the most recent prior exam, 4 mm, appears as closely approximated pulmonary vessels on the current study. There are no new nodules. 4. Stable postsurgical changes in the right upper lobe.  Aortic aneurysm NOS (ICD10-I71.9).   Electronically Signed   By: Lajean Manes M.D.   On: 11/18/2019 14:23 I personally reviewed the CT images and concur with the findings noted above  Impression: Navar Thorstenson is a 74 year old gentleman with a history of hypertension, ascending aortic aneurysm, mild to moderate mitral regurgitation,  inflammatory lung nodules, vertigo, and remote tobacco abuse (quit 30 years ago).  Ascending aneurysm-stable at 4.2 to 4.3 cm.  Needs continued annual follow-up.  His blood pressure is mildly elevated today.  Blood pressure is well controlled at home with systolics consistently less than 130.  Lung nodules-stable, no new suspicious findings.  Needs continued annual follow-up.  Plan: Return in 1 year with CT angiogram of chest  Melrose Nakayama, MD [facility] [phone]

## 2019-12-15 DIAGNOSIS — J439 Emphysema, unspecified: Secondary | ICD-10-CM | POA: Diagnosis not present

## 2019-12-15 DIAGNOSIS — I2510 Atherosclerotic heart disease of native coronary artery without angina pectoris: Secondary | ICD-10-CM | POA: Diagnosis not present

## 2019-12-15 DIAGNOSIS — I700 Atherosclerosis of aorta: Secondary | ICD-10-CM | POA: Diagnosis not present

## 2019-12-15 DIAGNOSIS — Z0000 Encounter for general adult medical examination without abnormal findings: Secondary | ICD-10-CM | POA: Diagnosis not present

## 2019-12-15 DIAGNOSIS — I10 Essential (primary) hypertension: Secondary | ICD-10-CM | POA: Diagnosis not present

## 2019-12-15 DIAGNOSIS — I712 Thoracic aortic aneurysm, without rupture: Secondary | ICD-10-CM | POA: Diagnosis not present

## 2019-12-15 DIAGNOSIS — Z125 Encounter for screening for malignant neoplasm of prostate: Secondary | ICD-10-CM | POA: Diagnosis not present

## 2019-12-15 DIAGNOSIS — Z136 Encounter for screening for cardiovascular disorders: Secondary | ICD-10-CM | POA: Diagnosis not present

## 2019-12-15 DIAGNOSIS — Z23 Encounter for immunization: Secondary | ICD-10-CM | POA: Diagnosis not present

## 2020-02-22 ENCOUNTER — Other Ambulatory Visit: Payer: Self-pay | Admitting: Thoracic Surgery (Cardiothoracic Vascular Surgery)

## 2020-02-27 ENCOUNTER — Other Ambulatory Visit: Payer: Self-pay | Admitting: Thoracic Surgery (Cardiothoracic Vascular Surgery)

## 2020-07-03 ENCOUNTER — Telehealth: Payer: Self-pay | Admitting: Cardiology

## 2020-07-03 NOTE — Telephone Encounter (Signed)
We are recommending the COVID-19 vaccine to all of our patients. Cardiac medications (including blood thinners) should not deter anyone from being vaccinated and there is no need to hold any of those medications prior to vaccine administration.     Currently, there is a hotline to call (active 11/12/19) to schedule vaccination appointments as no walk-ins will be accepted.   Number: [phone].    If an appointment is not available please go to [email or web address] to sign up for notification when additional vaccine appointments are available.   If you have further questions or concerns about the vaccine process, please visit [email or web address] or contact your primary care physician.    Patient is aware that Dr. Martinique recommends all of his patient's get the vaccine. He states he is just concerned about blood clots given his medical history. Please advise if this vaccine is still safe for the patient.

## 2020-07-03 NOTE — Telephone Encounter (Signed)
Yes the vaccine is safe and effective. The risk of blood clots is extremely low and it is not even clear that it is related to vaccine. The risk of contacting Covid and dying is much higher.   Lucill Mauck Martinique MD, [facility]

## 2020-07-03 NOTE — Telephone Encounter (Signed)
Called the patient and made him aware of the following:    Martinique, Peter M, MD  You Just now (12:45 PM)     Yes the vaccine is safe and effective. The risk of blood clots is extremely low and it is not even clear that it is related to vaccine. The risk of contacting Covid and dying is much higher.   Peter Martinique MD, [facility]      Pt verbalized understanding.

## 2020-07-25 ENCOUNTER — Telehealth: Payer: Self-pay | Admitting: Cardiology

## 2020-07-25 NOTE — Telephone Encounter (Signed)
Spoke to patient he stated he has had 2 episodes of sob.Stated last episode was last week he was working in his garage moving heavy equipment he became sob hard to breathe.Stated he had to sit down,took longer to get his breath back.No chest pain.Both episodes were while he was working.Stated he would like to see Dr.Jordan only.Appointment scheduled with Dr.Jordan 9/24 at 4:30 pm.

## 2020-07-25 NOTE — Telephone Encounter (Signed)
New Message:    Pt says he is having some problems and concerns and would like to be seen asap please.

## 2020-07-26 ENCOUNTER — Emergency Department (HOSPITAL_COMMUNITY): Payer: Medicare HMO

## 2020-07-26 ENCOUNTER — Emergency Department (HOSPITAL_COMMUNITY)
Admission: EM | Admit: 2020-07-26 | Discharge: 2020-07-26 | Disposition: A | Discharge status: Home / Self Care | Payer: Medicare HMO | Attending: Emergency Medicine | Admitting: Emergency Medicine

## 2020-07-26 DIAGNOSIS — R202 Paresthesia of skin: Secondary | ICD-10-CM | POA: Diagnosis not present

## 2020-07-26 DIAGNOSIS — R0602 Shortness of breath: Secondary | ICD-10-CM | POA: Diagnosis not present

## 2020-07-26 DIAGNOSIS — R0789 Other chest pain: Secondary | ICD-10-CM | POA: Diagnosis not present

## 2020-07-26 DIAGNOSIS — Z87891 Personal history of nicotine dependence: Secondary | ICD-10-CM | POA: Insufficient documentation

## 2020-07-26 DIAGNOSIS — R0609 Other forms of dyspnea: Secondary | ICD-10-CM | POA: Insufficient documentation

## 2020-07-26 DIAGNOSIS — Z79899 Other long term (current) drug therapy: Secondary | ICD-10-CM | POA: Insufficient documentation

## 2020-07-26 DIAGNOSIS — R002 Palpitations: Secondary | ICD-10-CM | POA: Insufficient documentation

## 2020-07-26 DIAGNOSIS — I10 Essential (primary) hypertension: Secondary | ICD-10-CM | POA: Insufficient documentation

## 2020-07-26 DIAGNOSIS — R000 Tachycardia, unspecified: Secondary | ICD-10-CM | POA: Diagnosis not present

## 2020-07-26 DIAGNOSIS — R079 Chest pain, unspecified: Secondary | ICD-10-CM | POA: Diagnosis not present

## 2020-07-26 DIAGNOSIS — I959 Hypotension, unspecified: Secondary | ICD-10-CM | POA: Diagnosis not present

## 2020-07-26 DIAGNOSIS — Z7982 Long term (current) use of aspirin: Secondary | ICD-10-CM | POA: Insufficient documentation

## 2020-07-26 LAB — TROPONIN I (HIGH SENSITIVITY)
Troponin I (High Sensitivity): 6 ng/L (ref <18)
Troponin I (High Sensitivity): 7 ng/L (ref <18)

## 2020-07-26 LAB — BASIC METABOLIC PANEL
Anion gap: 6 (ref 5–15)
BUN: 11 mg/dL (ref 8–23)
CO2: 25 mmol/L (ref 22–32)
Calcium: 7.6 mg/dL — ABNORMAL LOW (ref 8.9–10.3)
Chloride: 110 mmol/L (ref 98–111)
Creatinine, Ser: 0.75 mg/dL (ref 0.61–1.24)
GFR calc Af Amer: >60 mL/min (ref >60)
GFR calc non Af Amer: >60 mL/min (ref >60)
Glucose, Bld: 82 mg/dL (ref 70–99)
Potassium: 3.1 mmol/L — ABNORMAL LOW (ref 3.5–5.1)
Sodium: 141 mmol/L (ref 135–145)

## 2020-07-26 LAB — MAGNESIUM: Magnesium: 2.2 mg/dL (ref 1.7–2.4)

## 2020-07-26 LAB — D-DIMER, QUANTITATIVE: D-Dimer, Quant: 0.37 ug/mL-FEU (ref 0.00–0.50)

## 2020-07-26 LAB — CBC
HCT: 37.5 % — ABNORMAL LOW (ref 39.0–52.0)
Hemoglobin: 12.1 g/dL — ABNORMAL LOW (ref 13.0–17.0)
MCH: 28.3 pg (ref 26.0–34.0)
MCHC: 32.3 g/dL (ref 30.0–36.0)
MCV: 87.6 fL (ref 80.0–100.0)
Platelets: 122 10*3/uL — ABNORMAL LOW (ref 150–400)
RBC: 4.28 MIL/uL (ref 4.22–5.81)
RDW: 13 % (ref 11.5–15.5)
WBC: 5.5 10*3/uL (ref 4.0–10.5)
nRBC: 0 % (ref 0.0–0.2)

## 2020-07-26 NOTE — Discharge Instructions (Addendum)
Your work-up today was overall reassuring and your cardiac enzymes, troponins, were both negative.  Your blood clot test was negative.  Your x-ray was reassuring.  We monitored you for over 4 hours without significant abnormalities and you did not have recurrent symptoms.  Cardiology was called who came to see you.  They told me they discussed admission versus going home to do outpatient expedited work-up.  They report that she wanted to go home.  Given your stability and well appearance, we feel you are safe for discharge home.  Please avoid strenuous activity until you see the cardiology team.  If any symptoms change or worsen or recur, please return to the nearest emergency department immediately.

## 2020-07-26 NOTE — Consult Note (Signed)
Cardiology Consult    Patient ID: [name] MRN: [id number], DOB/AGE: Jan 27, 1946   Admit date: 07/26/2020 Date of Consult: 07/26/2020  Primary Physician: Orpah Melter, MD Primary Cardiologist: Peter Martinique, MD Requesting Provider: Antony Blackbird, MD  Patient Profile    Wayne Rodriguez is a 74 y.o. male with a history of MVP w/ moderate MR, ascending aortic aneurysm, and vertigo who presents to emergency department after developing shortness of breath and palpitations while exerting himself.  History of Present Illness    Today Wayne Rodriguez was working in his yard and went to crank on a mower in the rain and it wouldn't turn for him.  Given it was sitting in the rain he decided to pull it into its shed.  It weighs about 200 lb in his estimation.  He didn't have any frank chest pain but felt numbness and weakness and his arms as well as a racing heart which was very abnormal for him.  He was worried this might have been a heart attack so he presented to the ED.  He had a very similar episode two days prior when he tried to move a big air compressor and had contacted his cardiology office regarding a possible stress test.  He's scheduled to see Dr. Martinique to discuss this on Friday.  TroponinI 6.  EKG sith sinus bradycardia otherwise unremarkable.  He had similar symptoms in 2013 at which time he wore a heart monitor without any findings.  His doctor at that time told him he thought that he had   Past Medical History   Past Medical History:  Diagnosis Date  . Aortic root dilatation ([place])    a. 09/2010 CT: 4.1cm Max Asc Ao diam, 3.7cm Max Desc Ao diam;; s/p MRI/MRA in April 2013  . Back pain   . Cancer ([place])    skin cancer removed from face  . Chest pain    a. 02/2010 Cath: normal  . Decreased exercise tolerance    a. 02/2012  . GERD (gastroesophageal reflux disease)   . HTN (hypertension)   . Mitral valve prolapse   . Palpitations    a. h/o palps r/t caffeine - has  occurred again 02/2012  . Pulmonary nodule    3 nodules removed over 3 years by Dr Roxan Hockey  . PVC's (premature ventricular contractions) 07/17/2015  . Vertigo    hx of    Past Surgical History:  Procedure Laterality Date  . BACK SURGERY  1983  . CARDIAC CATHETERIZATION     no PCI  . COLONOSCOPY W/ POLYPECTOMY    . EYE SURGERY  1997   remove foreign body; right/right eye is blurry  . VIDEO ASSISTED THORACOSCOPY (VATS)/WEDGE RESECTION Right 03/17/2014   Procedure: VIDEO ASSISTED THORACOSCOPY (VATS)/WEDGE RESECTION;  Surgeon: Melrose Nakayama, MD;  Location: [facility];  Service: Thoracic;  Laterality: Right;     Allergies  Allergen Reactions  . Beta Adrenergic Blockers     Bradycardia   . Codeine     REACTION: nausea/vomiting   Inpatient Medications      Family History    Family History  Problem Relation Age of Onset  . Lung cancer Mother   . Colon cancer Sister 74   He indicated that his mother is deceased. He indicated that his father is deceased. He indicated that six of his seven sisters are alive. He indicated that his brother is alive. He indicated that his maternal grandmother is deceased. He indicated that his maternal  grandfather is deceased. He indicated that his paternal grandmother is deceased. He indicated that his paternal grandfather is deceased.   Social History    Social History   Socioeconomic History  . Marital status: Married    Spouse name: Not on file  . Number of children: Not on file  . Years of education: Not on file  . Highest education level: Not on file  Occupational History  . Not on file  Tobacco Use  . Smoking status: Former Smoker    Packs/day: 1.00    Years: 30.00    Pack years: 30.00    Types: Cigarettes    Start date: 03/08/1962    Quit date: 03/08/1992    Years since quitting: 28.4  . Smokeless tobacco: Former Systems developer    Types: Snuff, Sarina Ser    Quit date: 01/06/2013  Vaping Use  . Vaping Use: Never used  Substance and Sexual  Activity  . Alcohol use: No  . Drug use: No  . Sexual activity: Not Currently  Other Topics Concern  . Not on file  Social History Narrative  . Not on file   Social Determinants of Health   Financial Resource Strain:   . Difficulty of Paying Living Expenses: Not on file  Food Insecurity:   . Worried About Charity fundraiser in the Last Year: Not on file  . Ran Out of Food in the Last Year: Not on file  Transportation Needs:   . Lack of Transportation (Medical): Not on file  . Lack of Transportation (Non-Medical): Not on file  Physical Activity:   . Days of Exercise per Week: Not on file  . Minutes of Exercise per Session: Not on file  Stress:   . Feeling of Stress : Not on file  Social Connections:   . Frequency of Communication with Friends and Family: Not on file  . Frequency of Social Gatherings with Friends and Family: Not on file  . Attends Religious Services: Not on file  . Active Member of Clubs or Organizations: Not on file  . Attends Archivist Meetings: Not on file  . Marital Status: Not on file  Intimate Partner Violence:   . Fear of Current or Ex-Partner: Not on file  . Emotionally Abused: Not on file  . Physically Abused: Not on file  . Sexually Abused: Not on file     Review of Systems    General:  No chills, fever, night sweats or weight changes.  Cardiovascular:  No chest pain, dyspnea on exertion, edema, orthopnea, palpitations, paroxysmal nocturnal dyspnea. Dermatological: No rash, lesions/masses Respiratory: No cough, dyspnea Urologic: No hematuria, dysuria Abdominal:   No nausea, vomiting, diarrhea, bright red blood per rectum, melena, or hematemesis Neurologic:  No visual changes, wkns, changes in mental status. All other systems reviewed and are otherwise negative except as noted above.  Physical Exam    Blood pressure (!) 158/64, pulse (!) 44, temperature 98.6 F (37 C), temperature source Oral, resp. rate 17, height 5\' 9"  (1.753  m), weight 71.2 kg, SpO2 99 %.    No intake or output data in the 24 hours ending 07/26/20 2250 Wt Readings from Last 3 Encounters:  07/26/20 71.2 kg  11/23/19 70.8 kg  11/18/19 70.8 kg    CONSTITUTIONAL: alert and conversant, well-appearing, nourished, no distress HEENT: oropharynx clear and moist, no mucosal lesions, normal dentition, conjunctiva normal, EOM intact, pupils equal, no lid lag. NECK: supple, no cervical adenopathy, no thyromegaly CARDIOVASCULAR: Regular rhythm. No  gallop, murmur, or rub. Normal S1/S2. Radial pulses intact. JVP flat No carotid bruits. PULMONARY/CHEST WALL: no deformities, normal breath sounds bilaterally, normal work of breathing ABDOMINAL: soft, non-tender, non-distended EXTREMITIES: no edema or muscle atrophy, warm and well-perfused SKIN: Dry and intact without apparent rashes or wounds.  NEUROLOGIC: alert, cranial nerves grossly intact.   Labs    Troponin (Point of Care Test) No results for input(s): TROPIPOC in the last 72 hours. No results for input(s): CKTOTAL, CKMB, TROPONINI in the last 72 hours. Lab Results  Component Value Date   WBC 5.5 07/26/2020   HGB 12.1 (L) 07/26/2020   HCT 37.5 (L) 07/26/2020   MCV 87.6 07/26/2020   PLT 122 (L) 07/26/2020    Recent Labs  Lab 07/26/20 1919  NA 141  K 3.1*  CL 110  CO2 25  BUN 11  CREATININE 0.75  CALCIUM 7.6*  GLUCOSE 82   Lab Results  Component Value Date   CHOL 127 09/29/2017   HDL 59 09/29/2017   LDLCALC 52 09/29/2017   TRIG 78 09/29/2017   Lab Results  Component Value Date   DDIMER 0.37 07/26/2020     Radiology Studies    DG Chest 2 View  Result Date: 07/26/2020 CLINICAL DATA:  Chest pain and shortness of breath for the past 2 days. EXAM: CHEST - 2 VIEW COMPARISON:  CTA chest dated November 18, 2019. Chest x-ray dated April 05, 2014. FINDINGS: The heart size and mediastinal contours are within normal limits. Normal pulmonary vascularity. Postsurgical changes in the right  upper lobe. No focal consolidation, pleural effusion, or pneumothorax. No acute osseous abnormality. IMPRESSION: No active cardiopulmonary disease. Electronically Signed   By: Titus Dubin M.D.   On: 07/26/2020 20:02    ECG & Cardiac Imaging    Normal sinus rhythm with early R-wave transition across the precordium otherwise unremarkable.  TTE 1. The left ventricle has normal systolic function with an ejection  fraction of 60-65%. The cavity size was normal. Mild basal septal  hypertrophy. Left ventricular diastolic parameters were normal. No  evidence of left ventricular regional wall motion  abnormalities.  2. The right ventricle has normal systolic function. The cavity was  normal. There is no increase in right ventricular wall thickness.  3. Mild posterior leaflet mitral valve prolapse.  4. The mitral valve is myxomatous. Mild thickening of the mitral valve  leaflet. Mitral valve regurgitation is mild to moderate by color flow  Doppler. The MR jet is eccentric anteriorly directed.  5. The aortic valve is tricuspid. Mild sclerosis of the aortic valve.  Aortic valve regurgitation is mild to moderate by color flow Doppler.  6. There is mild dilatation of the ascending aorta measuring 41 mm.   ETT 2016 8.5 mets with 96% MPHR without findings of ischemia.  Hypertensive response to exercise and isolated PVCs.  Assessment & Plan   74 year old male with history of aortic root aneurysm, stable on recent imaging, mitral valve prolapse (mild) with moderate mitral regurgitation, hypertension who presents with atypical chest pain complaints and limb tingling with palpitations with exertion.  He has risk factors for coronary disease including age, hypertension, and smoking history.  No prior ischemic evaluation.  D-dimer is low, particularly for age making pulmonary embolism or aortic dissection very low probability without concerning symptoms.  Discussed with patient that this could be an  atypical presentation of ischemia given exertional symptoms and would be reasonable for stress test but no need to obtain this inpatient  with very low hsTroponin.  He is agreeable.  Will reach out to Dr. Martinique regarding this and plan to follow-up as scheduled Friday.  Can also consider event monitor given palpitations coinciding  Problem list Atypical chest pain Palpitations Hypertension Mitral valve prolapse with mild mitral regurgitation Aortic aneurysm  Plan #Atypical chest pain - Follow-up with Dr. Martinique, stress test modality at his discretion  #Palpitations - Follow-up stress test, suspect exertional PVCs based on prior ETT but will not start BB with resting bradycardia  #Hypertension - Continue lisinopril/HCTZ  #MVP/mild MR #Aortic aneurysm - Continue monitoring  Signed, Delight Hoh, MD 07/26/2020, 10:50 PM  For questions or updates, please contact   Please consult [email or web address] for contact info under Cardiology/STEMI.

## 2020-07-26 NOTE — ED Provider Notes (Signed)
[facility] EMERGENCY DEPARTMENT Provider Note   CSN: [id number] Arrival date & time: 07/26/20  1915     History Chief Complaint  Patient presents with   Shortness of Breath   Tachycardia    Wayne Rodriguez is a 74 y.o. male.  The history is provided by the patient and medical records. No language interpreter was used.  Shortness of Breath Severity:  Severe Onset quality:  Sudden Duration:  1 hour Timing:  Sporadic Progression:  Resolved Chronicity:  Recurrent Context: activity (exertion)   Relieved by:  Rest Worsened by:  Exertion Ineffective treatments:  None tried Associated symptoms: no abdominal pain, no chest pain, no claudication, no cough, no diaphoresis, no fever, no headaches, no neck pain, no sputum production, no syncope, no vomiting and no wheezing   Risk factors: no hx of PE/DVT        Past Medical History:  Diagnosis Date   Aortic root dilatation ([place])    a. 09/2010 CT: 4.1cm Max Asc Ao diam, 3.7cm Max Desc Ao diam;; s/p MRI/MRA in April 2013   Back pain    Cancer [facility])    skin cancer removed from face   Chest pain    a. 02/2010 Cath: normal   Decreased exercise tolerance    a. 02/2012   GERD (gastroesophageal reflux disease)    HTN (hypertension)    Mitral valve prolapse    Palpitations    a. h/o palps r/t caffeine - has occurred again 02/2012   Pulmonary nodule    3 nodules removed over 3 years by Dr Roxan Hockey   PVC's (premature ventricular contractions) 07/17/2015   Vertigo    hx of    Patient Active Problem List   Diagnosis Date Noted   PVC's (premature ventricular contractions) 07/17/2015   Mitral valve prolapse 07/17/2015   Benign tumor of lung 03/17/2014   Lung nodule 02/28/2014   Palpitations    Decreased exercise tolerance    Aortic root dilatation ([place]) 07/15/2011   HTN (hypertension) 07/15/2011   Chest pain 07/15/2011    Past Surgical History:  Procedure Laterality Date   [place]     no PCI   COLONOSCOPY W/ POLYPECTOMY     [place]   remove foreign body; right/right eye is blurry   VIDEO ASSISTED THORACOSCOPY (VATS)/WEDGE RESECTION Right 03/17/2014   Procedure: VIDEO ASSISTED THORACOSCOPY (VATS)/WEDGE RESECTION;  Surgeon: Melrose Nakayama, MD;  Location: [facility] OR;  Service: Thoracic;  Laterality: Right;       Family History  Problem Relation Age of Onset   Lung cancer Mother    Colon cancer Sister 57    Social History   Tobacco Use   Smoking status: Former Smoker    Packs/day: 1.00    Years: 30.00    Pack years: 30.00    Types: Cigarettes    Start date: 03/08/1962    Quit date: 03/08/1992    Years since quitting: 28.4   Smokeless tobacco: Former Systems developer    Types: Snuff, Chew    Quit date: 01/06/2013  Vaping Use   Vaping Use: Never used  Substance Use Topics   Alcohol use: No   Drug use: No    Home Medications Prior to Admission medications   Medication Sig Start Date End Date Taking? Authorizing Provider  amLODipine (NORVASC) 5 MG tablet Take 1 tablet (5 mg total) by mouth daily. 06/01/19   Melrose Nakayama, MD  aspirin 81 MG tablet Take 81 mg by mouth daily.      [provider]  Cholecalciferol (VITAMIN D-3 PO) Take 1,000 mg by mouth daily.     [provider]  Coenzyme Q10 200 MG capsule Take 200 mg by mouth daily.    [provider]  Cyanocobalamin (VITAMIN B-12 PO) Take 1 tablet by mouth daily.     [provider]  lisinopril-hydrochlorothiazide (ZESTORETIC) 20-12.5 MG tablet Take 2 tablets by mouth daily. 11/18/19   Almyra Deforest, PA  Multiple Vitamins-Minerals (PRESERVISION AREDS 2 PO) Take 1 tablet by mouth daily.    [provider]  nitroGLYCERIN (NITROSTAT) 0.4 MG SL tablet Place 1 tablet (0.4 mg total) under the tongue every 5 (five) minutes as needed. 03/16/15   Martinique, Peter M, MD  Probiotic Product (PROBIOTIC DAILY PO) Take 1 capsule by  mouth daily. Reported on 01/15/2016    [provider]    Allergies    Beta adrenergic blockers and Codeine  Review of Systems   Review of Systems  Constitutional: Positive for fatigue. Negative for chills, diaphoresis and fever.  HENT: Negative for congestion.   Eyes: Negative for visual disturbance.  Respiratory: Positive for chest tightness and shortness of breath. Negative for cough, sputum production, choking, wheezing and stridor.   Cardiovascular: Positive for palpitations. Negative for chest pain, claudication, leg swelling and syncope.  Gastrointestinal: Negative for abdominal pain, constipation, diarrhea, nausea and vomiting.  Genitourinary: Negative for flank pain.  Musculoskeletal: Negative for back pain, neck pain and neck stiffness.  Neurological: Positive for light-headedness. Negative for dizziness, speech difficulty, weakness, numbness and headaches.  Psychiatric/Behavioral: Negative for agitation.  All other systems reviewed and are negative.   Physical Exam Updated Vital Signs BP (!) 160/60 (BP Location: Right Arm)    Pulse (!) 50    Temp 98.6 F (37 C) (Oral)    Resp 18    Ht 5\' 9"  (1.753 m)    Wt 71.2 kg    SpO2 100%    BMI 23.18 kg/m   Physical Exam Vitals and nursing note reviewed.  Constitutional:      General: He is not in acute distress.    Appearance: He is well-developed. He is not ill-appearing, toxic-appearing or diaphoretic.  HENT:     Head: Normocephalic and atraumatic.     Mouth/Throat:     Mouth: Mucous membranes are moist.     Pharynx: No oropharyngeal exudate.  Eyes:     Conjunctiva/sclera: Conjunctivae normal.     Pupils: Pupils are equal, round, and reactive to light.  Cardiovascular:     Rate and Rhythm: Normal rate and regular rhythm.     Heart sounds: No murmur heard.   Pulmonary:     Effort: Pulmonary effort is normal. No tachypnea or respiratory distress.     Breath sounds: Normal breath sounds. No decreased breath  sounds, wheezing, rhonchi or rales.  Chest:     Chest wall: No tenderness.  Abdominal:     Palpations: Abdomen is soft.     Tenderness: There is no abdominal tenderness.  Musculoskeletal:     Cervical back: Neck supple.     Right lower leg: No tenderness. No edema.     Left lower leg: No tenderness. No edema.  Skin:    General: Skin is warm and dry.     Capillary Refill: Capillary refill takes less than 2 seconds.     Findings: No erythema.  Neurological:  General: No focal deficit present.     Mental Status: He is alert.  Psychiatric:        Mood and Affect: Mood normal.     ED Results / Procedures / Treatments   Labs (all labs ordered are listed, but only abnormal results are displayed) Labs Reviewed  BASIC METABOLIC PANEL - Abnormal; Notable for the following components:      Result Value   Potassium 3.1 (*)    Calcium 7.6 (*)    All other components within normal limits  CBC - Abnormal; Notable for the following components:   Hemoglobin 12.1 (*)    HCT 37.5 (*)    Platelets 122 (*)    All other components within normal limits  D-DIMER, QUANTITATIVE (NOT AT [facility])  MAGNESIUM  TSH  TROPONIN I (HIGH SENSITIVITY)  TROPONIN I (HIGH SENSITIVITY)    EKG EKG Interpretation  Date/Time:  Wednesday July 26 2020 19:15:14 EDT Ventricular Rate:  48 PR Interval:    QRS Duration: 107 QT Interval:  433 QTC Calculation: 387 R Axis:   56 Text Interpretation: Sinus bradycardia Prolonged PR interval Abnormal R-wave progression, early transition When comapred to prior, similar apperance with slower rate. No STEMI Confirmed by Antony Blackbird [phone]) on 07/26/2020 7:36:30 PM   Radiology DG Chest 2 View  Result Date: 07/26/2020 CLINICAL DATA:  Chest pain and shortness of breath for the past 2 days. EXAM: CHEST - 2 VIEW COMPARISON:  CTA chest dated November 18, 2019. Chest x-ray dated April 05, 2014. FINDINGS: The heart size and mediastinal contours are within normal limits.  Normal pulmonary vascularity. Postsurgical changes in the right upper lobe. No focal consolidation, pleural effusion, or pneumothorax. No acute osseous abnormality. IMPRESSION: No active cardiopulmonary disease. Electronically Signed   By: Titus Dubin M.D.   On: 07/26/2020 20:02    Procedures Procedures (including critical care time)  Medications Ordered in ED Medications - No data to display  ED Course  I have reviewed the triage vital signs and the nursing notes.  Pertinent labs & imaging results that were available during my care of the patient were reviewed by me and considered in my medical decision making (see chart for details).    MDM Rules/Calculators/A&P                          Wayne Rodriguez is a 74 y.o. male with a past medical history significant for hypertension, chronic palpitations with PVCs, mitral valve prolapse, aortic root dilation, GERD, and skin cancer history who presents for exertional shortness of breath and palpitations.  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  He denies any discomfort but reports he got tingly in his arms, fatigued, lightheaded, and cannot catch his breath.  He reports he also had fast palpitations.  He reports this is new.  He denied nausea, vomiting, or diaphoresis.  He says that it lasted around 1 hour resolved with rest.  He reports that he called his cardiologist who scheduled to see him in 2 days on Friday.  He was instructed not to do any exertion until he is.  Patient reports that today, it started raining at his house and he had a lawnmower that was sitting outside.  It would not start so he tried to drag it back inside and again exerted himself causing the  exertional shortness of breath and symptoms.  He said today he again had the chest tightness, the palpitations, and the lightheadedness, fatigue,  and shortness of breath.  He still denies any chest discomfort but reports he did have some chest tightness and could not catch his breath.  He denied any history of DVT or PE, leg pain, leg swelling, or medication changes.  He took nitro which did not seem to help his symptoms as well as aspirin.  Patient presents for evaluation.  After arrival to the emergency department his symptoms have resolved.  He no longer has any shortness of breath or palpitations.  He does have PVCs on telemetry but he did not show STEMI and appears similar to prior.  He is maintaining oxygen saturations at 100% on room air and is actually bradycardic and not tachycardic.  He is slightly hypertensive.  Afebrile.  We will get labs including a troponin and a D-dimer given the pleuritic chest tightness and shortness of breath he is experiencing.  We will get a chest x-ray.  Anticipate touch base with cardiology if there is any concerning findings on his work-up to help determine disposition.  If work-up is reassuring, anticipate he will be safe for discharge to follow-up directed previously with cardiology on Friday.  Anticipate reassessment after work-up.   D-dimer was negative.  Delta troponin negative.  Magnesium normal.  Due to his concerning story of exertional shortness of breath and plans to see cardiology within the next few days, cards was called.  They came to see the patient and offered admission for stress test versus discharge home and close follow-up on Friday.  Patient would rather go home.  Patient continues to be symptom-free and has no complaints in the emergency department.  He was monitored for over 4 hours without return of symptoms and was well-appearing.  Patient understands return precautions and follow-up instructions and will see his cardiologist in the next few days.  He had no other questions or concerns and was discharged in good condition.   Final Clinical Impression(s) / ED Diagnoses Final  diagnoses:  Exertional shortness of breath  Palpitations    Rx / DC Orders ED Discharge Orders    None      Clinical Impression: 1. Exertional shortness of breath   2. Palpitations     Disposition: Discharge  Condition: Good  I have discussed the results, Dx and Tx plan with the pt(& family if present). He/she/they expressed understanding and agree(s) with the plan. Discharge instructions discussed at great length. Strict return precautions discussed and pt &/or family have verbalized understanding of the instructions. No further questions at time of discharge.    Discharge Medication List as of 07/26/2020 11:29 PM      Follow Up: [place] cardiologist on Friday        Nathanyl Andujo, Gwenyth Allegra, MD 07/26/20 2342

## 2020-07-26 NOTE — ED Triage Notes (Signed)
Per patient out working outside in the yard, began having palpatitions and SOB. Similar episode 3 days ago. Was planning on followup with cards on Friday. Today had 324mg  ASA, 18 L FA, 1 SL nitro with improvement in symptoms. No distress noted at present.

## 2020-07-27 NOTE — Progress Notes (Signed)
Cardiology Office Note:    Date:  07/28/2020   ID:  Wayne Rodriguez, DOB Aug 13, 1946, MRN [id number]  PCP:  Orpah Melter, MD  Cardiologist:  Jakylah Bassinger Martinique, MD  Electrophysiologist:  None   Referring MD: Orpah Melter, MD   Chief Complaint  Patient presents with   Palpitations    History of Present Illness:    Wayne Rodriguez is a 74 y.o. male seen for evaluation of atypical chest pain and palpitations. He has a hx of PVCs, aortic root dilatation, hypertension, coronary artery calcification, mitral valve prolapse and history of lung nodule.  Previous cardiac catheterization on 02/26/2010 demonstrated right dominant system with mild irregularities however no significant stenosis.  Last echocardiogram obtained on 04/12/2019 showed EF 60 to 65%, mild basal septal hypertrophy, mild posterior leaflet mitral valve prolapse, myxomatous mitral valve with mild thickening of leaflet, mild to moderate MR, mild dilatation of the ascending aorta measuring at 41 mm.  CT of the chest obtained on 06/01/2019 showed minimal increase in the ascending thoracic aortic aneurysm measuring at 4.5 cm, new 4 mm right upper lobe pulmonary nodule, aortic atherosclerosis and two-vessel coronary artery calcification.  He was seen by Dr. Roxan Hockey in July 2020, Norvasc 5 mg daily was added to help with blood pressure control. Follow up CT in January 2021 was unchanged.   He was seen in the ED on 07/26/20 with atypical chest pain. Seen by cardiology coverage. Ecg was normal. Troponin was normal x 2. D dimer normal. Seen today to reevaluate. Patient states on Monday he was dragging a 200 lb air compressor in his garage and felt his heart pounding and arms tingling. Felt like when he breathed in he wasn't getting oxygen. No real pain. On Wednesday his mower conked out so he was dragging it into his garage when he had similar symptoms- more sensation of palpitations or fast pounding. This is when he went to the ED. No symptoms  since then.   Past Medical History:  Diagnosis Date   Aortic root dilatation ([place])    a. 09/2010 CT: 4.1cm Max Asc Ao diam, 3.7cm Max Desc Ao diam;; s/p MRI/MRA in April 2013   Back pain    Cancer [facility])    skin cancer removed from face   Chest pain    a. 02/2010 Cath: normal   Decreased exercise tolerance    a. 02/2012   GERD (gastroesophageal reflux disease)    HTN (hypertension)    Mitral valve prolapse    Palpitations    a. h/o palps r/t caffeine - has occurred again 02/2012   Pulmonary nodule    3 nodules removed over 3 years by Dr Roxan Hockey   PVC's (premature ventricular contractions) 07/17/2015   Vertigo    hx of    Past Surgical History:  Procedure Laterality Date   [place]     no PCI   COLONOSCOPY W/ [place]   remove foreign body; right/right eye is blurry   VIDEO ASSISTED THORACOSCOPY (VATS)/WEDGE RESECTION Right 03/17/2014   Procedure: VIDEO ASSISTED THORACOSCOPY (VATS)/WEDGE RESECTION;  Surgeon: Melrose Nakayama, MD;  Location: [place];  Service: Thoracic;  Laterality: Right;    Current Medications: Current Meds  Medication Sig   amLODipine (NORVASC) 5 MG tablet Take 1 tablet (5 mg total) by mouth daily.   aspirin 81 MG tablet Take 81 mg by mouth daily.     Cholecalciferol (VITAMIN D-3  PO) Take 1,000 mg by mouth daily.    Coenzyme Q10 200 MG capsule Take 200 mg by mouth daily.   Cyanocobalamin (VITAMIN B-12 PO) Take 1 tablet by mouth daily.    lisinopril-hydrochlorothiazide (ZESTORETIC) 20-12.5 MG tablet Take 2 tablets by mouth daily.   nitroGLYCERIN (NITROSTAT) 0.4 MG SL tablet Place 1 tablet (0.4 mg total) under the tongue every 5 (five) minutes as needed.   Probiotic Product (PROBIOTIC DAILY PO) Take 1 capsule by mouth daily. Reported on 01/15/2016   [DISCONTINUED] nitroGLYCERIN (NITROSTAT) 0.4 MG SL tablet Place 1 tablet (0.4 mg total) under the tongue every 5 (five)  minutes as needed. (Patient taking differently: Place 0.4 mg under the tongue every 5 (five) minutes as needed for chest pain. )     Allergies:   Beta adrenergic blockers and Codeine   Social History   Socioeconomic History   Marital status: Married    Spouse name: Not on file   Number of children: Not on file   Years of education: Not on file   Highest education level: Not on file  Occupational History   Not on file  Tobacco Use   Smoking status: Former Smoker    Packs/day: 1.00    Years: 30.00    Pack years: 30.00    Types: Cigarettes    Start date: 03/08/1962    Quit date: 03/08/1992    Years since quitting: 28.4   Smokeless tobacco: Former Systems developer    Types: Snuff, Chew    Quit date: 01/06/2013  Vaping Use   Vaping Use: Never used  Substance and Sexual Activity   Alcohol use: No   Drug use: No   Sexual activity: Not Currently  Other Topics Concern   Not on file  Social History Narrative   Not on file   Social Determinants of Health   Financial Resource Strain:    Difficulty of Paying Living Expenses: Not on file  Food Insecurity:    Worried About Charity fundraiser in the Last Year: Not on file   [place] in the Last Year: Not on file  Transportation Needs:    Lack of Transportation (Medical): Not on file   Lack of Transportation (Non-Medical): Not on file  Physical Activity:    Days of Exercise per Week: Not on file   Minutes of Exercise per Session: Not on file  Stress:    Feeling of Stress : Not on file  Social Connections:    Frequency of Communication with Friends and Family: Not on file   Frequency of Social Gatherings with Friends and Family: Not on file   Attends Religious Services: Not on file   Active Member of Clubs or Organizations: Not on file   Attends Archivist Meetings: Not on file   Marital Status: Not on file     Family History: The patient's family history includes Colon cancer (age of onset: 57)  in his sister; Lung cancer in his mother.  ROS:   Please see the history of present illness.     All other systems reviewed and are negative.  EKGs/Labs/Other Studies Reviewed:    The following studies were reviewed today:  Echo 04/12/2019 IMPRESSIONS    1. The left ventricle has normal systolic function with an ejection fraction of 60-65%. The cavity size was normal. Mild basal septal hypertrophy. Left ventricular diastolic parameters were normal. No evidence of left ventricular regional wall motion  abnormalities.  2. The right ventricle has  normal systolic function. The cavity was normal. There is no increase in right ventricular wall thickness.  3. Mild posterior leaflet mitral valve prolapse.  4. The mitral valve is myxomatous. Mild thickening of the mitral valve leaflet. Mitral valve regurgitation is mild to moderate by color flow Doppler. The MR jet is eccentric anteriorly directed.  5. The aortic valve is tricuspid. Mild sclerosis of the aortic valve. Aortic valve regurgitation is mild to moderate by color flow Doppler.  6. There is mild dilatation of the ascending aorta measuring 41 mm.   CT of chest 06/01/2019 IMPRESSION: 1. Minimal increase in size of ascending thoracic aortic aneurysm which currently measures 4.5 cm in diameter (previously 4.4 cm). Ascending thoracic aortic aneurysm. Recommend semi-annual imaging followup by CTA or MRA and referral to cardiothoracic surgery if not already obtained. This recommendation follows 2010 ACCF/AHA/AATS/ACR/ASA/SCA/SCAI/SIR/STS/SVM Guidelines for the Diagnosis and Management of Patients With Thoracic Aortic Disease. Circulation. 2010; 121: K093-G182. Aortic aneurysm NOS (ICD10-I71.9). 2. Previously noted left upper lobe pulmonary nodule is stable dating back to 2017, considered benign. However, there is a new 4 mm right upper lobe pulmonary nodule (axial image 77 of series 8). This is nonspecific, but attention at time of  follow-up examination is recommended to ensure stability of this finding. 3. Mild diffuse bronchial wall thickening with mild centrilobular and paraseptal emphysema; imaging findings suggestive of underlying COPD. 4. Aortic atherosclerosis, in addition to left main and 2 vessel coronary artery disease. Please note that although the presence of coronary artery calcium documents the presence of coronary artery disease, the severity of this disease and any potential stenosis cannot be assessed on this non-gated CT examination. Assessment for potential risk factor modification, dietary therapy or pharmacologic therapy may be warranted, if clinically indicated.   EKG:  EKG is ordered today.  The ekg ordered today demonstrates normal sinus rhythm, first-degree AV block.  Recent Labs: 07/26/2020: BUN 11; Creatinine, Ser 0.75; Hemoglobin 12.1; Magnesium 2.2; Platelets 122; Potassium 3.1; Sodium 141  Recent Lipid Panel    Component Value Date/Time   CHOL 127 09/29/2017 1009   TRIG 78 09/29/2017 1009   HDL 59 09/29/2017 1009   CHOLHDL 3 10/19/2012 0844   VLDL 22.8 10/19/2012 0844   LDLCALC 52 09/29/2017 1009   Dated 12/15/19: cholesterol 135, triglycerides 77, HDL 53, LDL 67. ALT normal.  Ecg today shows NSR rate 54. First degree AV block. Otherwise normal.   Physical Exam:    VS:  BP 132/60    Pulse (!) 54    Ht 5\' 9"  (1.753 m)    Wt 151 lb 12.8 oz (68.9 kg)    SpO2 97%    BMI 22.42 kg/m     Wt Readings from Last 3 Encounters:  07/28/20 151 lb 12.8 oz (68.9 kg)  07/26/20 157 lb (71.2 kg)  11/23/19 156 lb (70.8 kg)     GEN:  Well nourished, well developed in no acute distress HEENT: Normal NECK: No JVD; No carotid bruits LYMPHATICS: No lymphadenopathy CARDIAC: RRR, no murmurs, rubs, gallops RESPIRATORY:  Clear to auscultation without rales, wheezing or rhonchi  ABDOMEN: Soft, non-tender, non-distended MUSCULOSKELETAL:  No edema; No deformity  SKIN: Warm and dry NEUROLOGIC:   Alert and oriented x 3 PSYCHIATRIC:  Normal affect   ASSESSMENT:    1. Aortic root dilatation ([facility])   2. Essential hypertension   3. Palpitations   4. Heart murmur    PLAN:    In order of problems listed above:  1. Palpitations.  Atypical. ED evaluation negative. No arrhythmia seen. Symptoms do not sound ischemic in origin. Will monitor now. Symptoms only occurred with heavy lifting.    2.   Coronary artery calcification: Seen on last CT of chest in July 2020.  Denies any obvious chest discomfort  3.  Thoracic aortic aneurysm: Followed by Dr. Roxan Hockey.   4.  History of PVCs: No significant palpitation recently  5. Mitral valve prolapse: mild   6. Lung nodule: New lung nodule noted on the most recent CT of the chest.  He is getting regular CT for thoracic aortic aneurysm as well.  7. HTN controlled.    Medication Adjustments/Labs and Tests Ordered: Current medicines are reviewed at length with the patient today.  Concerns regarding medicines are outlined above.  Orders Placed This Encounter  Procedures   EKG 12-Lead   Meds ordered this encounter  Medications   nitroGLYCERIN (NITROSTAT) 0.4 MG SL tablet    Sig: Place 1 tablet (0.4 mg total) under the tongue every 5 (five) minutes as needed.    Dispense:  25 tablet    Refill:  11    There are no Patient Instructions on file for this visit.  Follow up in 6 months.  Signed, Hodaya Curto Martinique, MD  07/28/2020 5:31 PM    Wayne Rodriguez

## 2020-07-28 ENCOUNTER — Ambulatory Visit: Payer: Medicare HMO | Admitting: Cardiology

## 2020-07-28 ENCOUNTER — Other Ambulatory Visit: Payer: Self-pay

## 2020-07-28 ENCOUNTER — Encounter: Payer: Self-pay | Admitting: Cardiology

## 2020-07-28 VITALS — BP 132/60 | HR 54 | Ht 69.0 in | Wt 151.8 lb

## 2020-07-28 DIAGNOSIS — I77810 Thoracic aortic ectasia: Secondary | ICD-10-CM | POA: Diagnosis not present

## 2020-07-28 DIAGNOSIS — I10 Essential (primary) hypertension: Secondary | ICD-10-CM

## 2020-07-28 DIAGNOSIS — R002 Palpitations: Secondary | ICD-10-CM

## 2020-07-28 DIAGNOSIS — R011 Cardiac murmur, unspecified: Secondary | ICD-10-CM

## 2020-07-28 MED ORDER — NITROGLYCERIN 0.4 MG SL SUBL: 0.4000 mg | SUBLINGUAL_TABLET | SUBLINGUAL | 11 refills | Status: AC | PRN

## 2020-07-31 ENCOUNTER — Telehealth: Payer: Self-pay | Admitting: Cardiology

## 2020-07-31 NOTE — Telephone Encounter (Signed)
Pt advise it is okay for him to receive the 2nd COVID vaccine today.

## 2020-07-31 NOTE — Telephone Encounter (Signed)
Patient wants to know if it's okay for him to get his 2nd COVID vaccine dose today, since his potassium was low last week.

## 2020-10-06 DIAGNOSIS — H524 Presbyopia: Secondary | ICD-10-CM | POA: Diagnosis not present

## 2020-10-13 ENCOUNTER — Other Ambulatory Visit: Payer: Self-pay | Admitting: Thoracic Surgery (Cardiothoracic Vascular Surgery)

## 2020-10-13 DIAGNOSIS — I712 Thoracic aortic aneurysm, without rupture, unspecified: Secondary | ICD-10-CM

## 2020-11-14 ENCOUNTER — Ambulatory Visit
Admission: RE | Admit: 2020-11-14 | Discharge: 2020-11-14 | Disposition: A | Discharge status: Home / Self Care | Payer: Medicare HMO | Source: Ambulatory Visit | Attending: Thoracic Surgery (Cardiothoracic Vascular Surgery) | Admitting: Thoracic Surgery (Cardiothoracic Vascular Surgery)

## 2020-11-14 DIAGNOSIS — J984 Other disorders of lung: Secondary | ICD-10-CM | POA: Diagnosis not present

## 2020-11-14 DIAGNOSIS — I700 Atherosclerosis of aorta: Secondary | ICD-10-CM | POA: Diagnosis not present

## 2020-11-14 DIAGNOSIS — I712 Thoracic aortic aneurysm, without rupture, unspecified: Secondary | ICD-10-CM

## 2020-11-14 DIAGNOSIS — D7389 Other diseases of spleen: Secondary | ICD-10-CM | POA: Diagnosis not present

## 2020-11-14 MED ORDER — IOPAMIDOL (ISOVUE-370) INJECTION 76%
75.0000 mL | Freq: Once | INTRAVENOUS | Status: AC | PRN
  Administered 2020-11-14: 75 mL via INTRAVENOUS

## 2020-11-21 ENCOUNTER — Encounter: Payer: Self-pay | Admitting: Thoracic Surgery (Cardiothoracic Vascular Surgery)

## 2020-11-21 ENCOUNTER — Other Ambulatory Visit: Payer: Self-pay

## 2020-11-21 ENCOUNTER — Ambulatory Visit: Payer: Medicare HMO | Admitting: Thoracic Surgery (Cardiothoracic Vascular Surgery)

## 2020-11-21 VITALS — BP 118/65 | HR 74 | Resp 20 | Ht 69.0 in | Wt 152.0 lb

## 2020-11-21 DIAGNOSIS — I712 Thoracic aortic aneurysm, without rupture, unspecified: Secondary | ICD-10-CM

## 2020-11-21 MED ORDER — AMLODIPINE BESYLATE 5 MG PO TABS
5.0000 mg | ORAL_TABLET | Freq: Every day | ORAL | 2 refills | Status: DC
Start: 2020-11-21 — End: 2022-10-15

## 2020-11-21 NOTE — Progress Notes (Signed)
[address]       [place] [address]             [phone]       HPI: [name] returns for follow-up of his ascending aneurysm  Wayne Rodriguez is a 75 year old man with a past medical history significant for hypertension, ascending aneurysm, mild to moderate mitral regurgitation, inflammatory lung nodules, vertigo, and remote tobacco abuse.  I did a wedge resection for a right upper lobe nodule in 2015.  It turned out to be  inflammatory in nature.  He has been followed for an ascending aneurysm dating back to 2011.  There has been some variation in measurements between radiologists over time, but it has essentially remained unchanged.  He feels well.  He remains active.  He is not having any chest pain, pressure, tightness, or shortness of breath.  He quit smoking over 30 years ago.  He still works on large trucks.  Past Medical History:  Diagnosis Date  . Aortic root dilatation ([place])    a. 09/2010 CT: 4.1cm Max Asc Ao diam, 3.7cm Max Desc Ao diam;; s/p MRI/MRA in April 2013  . Back pain   . Cancer ([place])    skin cancer removed from face  . Chest pain    a. 02/2010 Cath: normal  . Decreased exercise tolerance    a. 02/2012  . GERD (gastroesophageal reflux disease)   . HTN (hypertension)   . Mitral valve prolapse   . Palpitations    a. h/o palps r/t caffeine - has occurred again 02/2012  . Pulmonary nodule    3 nodules removed over 3 years by Dr Roxan Hockey  . PVC's (premature ventricular contractions) 07/17/2015  . Vertigo    hx of    Current Outpatient Medications  Medication Sig Dispense Refill  . aspirin 81 MG tablet Take 81 mg by mouth daily.    . Cholecalciferol (VITAMIN D-3 PO) Take 1,000 mg by mouth daily.     . Coenzyme Q10 200 MG capsule Take 200 mg by mouth daily.    . Cyanocobalamin (VITAMIN B-12 PO) Take 1 tablet by mouth daily.     Marland Kitchen lisinopril-hydrochlorothiazide (ZESTORETIC) 20-12.5 MG tablet Take 2 tablets by mouth daily. 180  tablet 3  . nitroGLYCERIN (NITROSTAT) 0.4 MG SL tablet Place 1 tablet (0.4 mg total) under the tongue every 5 (five) minutes as needed. 25 tablet 11  . Probiotic Product (PROBIOTIC DAILY PO) Take 1 capsule by mouth daily. Reported on 01/15/2016    . amLODipine (NORVASC) 5 MG tablet Take 1 tablet (5 mg total) by mouth daily. 90 tablet 2   No current facility-administered medications for this visit.    Physical Exam BP 118/65   Pulse 74   Resp 20   Ht 5\' 9"  (1.753 m)   Wt 152 lb (68.9 kg)   SpO2 99% Comment: RA  BMI 22.23 kg/m  75 year old man in no acute distress Alert and oriented x3 with no focal deficits Lungs clear with equal breath sounds bilaterally Cardiac regular rate and rhythm with normal S1 and S2 No carotid bruits No peripheral edema  Diagnostic Tests: CT ANGIOGRAPHY CHEST WITH CONTRAST  TECHNIQUE: Multidetector CT imaging of the chest was performed using the standard protocol during bolus administration of intravenous contrast. Multiplanar CT image reconstructions and MIPs were obtained to evaluate the vascular anatomy.  CONTRAST:  21mL ISOVUE-370 IOPAMIDOL (ISOVUE-370) INJECTION 76%  COMPARISON:  11/18/2019, 06/01/2019, 01/27/2018, 01/28/2017, most remote  09/24/2010  FINDINGS: Cardiovascular:  Heart:  No cardiomegaly. No pericardial fluid/thickening. No significant coronary calcifications.  Aorta:  No significant aortic valve calcifications.  Maximum diameter of the ascending aorta estimated 41 mm. Greatest diameter estimated on the most remote CT 09/24/2010 was 41 mm.  Mild atherosclerotic changes of the aortic arch. Branch vessels are patent. Three vessel arch. Mild atherosclerosis of the left subclavian artery without high-grade stenosis. The cervical cerebral vessels are patent. Dominant right vertebral artery.  No dissection or ulcerated plaque. Diameter of the thoracic aorta at the hiatus measures 28 mm.  Pulmonary  arteries:  Timing of the contrast bolus is not optimized for evaluation of pulmonary artery filling defects.  Mediastinum/Nodes: No mediastinal adenopathy. Unremarkable appearance of the thoracic esophagus.  Unremarkable thoracic inlet.  Lungs/Pleura: Scarring/surgical changes at the apex of the right upper lobe, similar to the comparison. No new or enlarging nodularity or soft tissue. No pneumothorax or pleural effusion.  No confluent airspace disease.  Left upper lobe nodule with linear configuration, measuring longest diameter 8.8 mm, short axis 5.4 mm. Essentially this is unchanged dating to the CT of 04/30/2016 when it was identified. At that time the diameters estimated 6 mm x 10 mm.  No additional nodules.  Upper Abdomen: Calcifications within spleen parenchyma. No acute finding of the upper abdomen.  Musculoskeletal: Degenerative changes of the spine with disc space narrowing. Syndesmophyte formation along the ventral aspect of the thoracic spine. No bony canal narrowing. No displaced fracture.  Review of the MIP images confirms the above findings.  IMPRESSION: Diameter of the ascending aorta is essentially unchanged when compared to the most remote CT of 09/24/2010, with the greatest estimated diameter on the current CT approximately 41 mm.  The left upper lobe nodule is favored to be benign, given the relatively unchanged appearance/size over nearly 5 years dating to the most remote comparison CT of 04/25/2015.  Redemonstration of postsurgical changes of right upper lobe with associated scarring.  Aortic Atherosclerosis (ICD10-I70.0).  Signed,  Dulcy Fanny. Dellia Nims, RPVI  [facility]  [facility]   Electronically Signed   By: Corrie Mckusick D.O.   On: 11/14/2020 14:04 I personally reviewed the CT images.  His ascending aortic aneurysm is unchanged at 4.1 cm.  Lung findings unchanged as  well.  Impression: Wayne Rodriguez is a 75 year old man ascending aneurysm/thoracic aortic atherosclerosiswith a past medical history significant for hypertension, ascending aneurysm, mild to moderate mitral regurgitation, inflammatory lung nodules, vertigo, and remote tobacco abuse.   Ascending aneurysm/thoracic aortic atherosclerosis-stable at 4.1 cm.  Importance of blood pressure control emphasized.  Needs continued annual follow-up.  Hypertension-blood pressure well controlled on current regimen.  He is running low on Norvasc so went ahead and represcribe that medication.  Lung nodule-left upper lobe, unchanged over 5 years.  No follow-up indicated.  Plan: Represcribed Norvasc 5 mg daily Return in 1 year with CT angiogram of chest  Melrose Nakayama, MD [facility] [phone]

## 2020-12-14 DIAGNOSIS — Z0000 Encounter for general adult medical examination without abnormal findings: Secondary | ICD-10-CM | POA: Diagnosis not present

## 2020-12-14 DIAGNOSIS — I700 Atherosclerosis of aorta: Secondary | ICD-10-CM | POA: Diagnosis not present

## 2020-12-14 DIAGNOSIS — I10 Essential (primary) hypertension: Secondary | ICD-10-CM | POA: Diagnosis not present

## 2020-12-14 DIAGNOSIS — I2510 Atherosclerotic heart disease of native coronary artery without angina pectoris: Secondary | ICD-10-CM | POA: Diagnosis not present

## 2020-12-14 DIAGNOSIS — Z125 Encounter for screening for malignant neoplasm of prostate: Secondary | ICD-10-CM | POA: Diagnosis not present

## 2020-12-14 DIAGNOSIS — J439 Emphysema, unspecified: Secondary | ICD-10-CM | POA: Diagnosis not present

## 2020-12-14 DIAGNOSIS — I712 Thoracic aortic aneurysm, without rupture: Secondary | ICD-10-CM | POA: Diagnosis not present

## 2021-01-24 NOTE — Progress Notes (Unsigned)
Cardiology Office Note:    Date:  01/26/2021   ID:  Wayne Rodriguez, DOB 10-06-1946, MRN [id number]  PCP:  Orpah Melter, MD  Cardiologist:  Peter Martinique, MD  Electrophysiologist:  None   Referring MD: Orpah Melter, MD   Chief Complaint  Patient presents with  . Coronary Artery Disease    History of Present Illness:    Wayne Rodriguez is a 75 y.o. male seen for evaluation of atypical chest pain and palpitations. He has a hx of PVCs, aortic root dilatation, hypertension, coronary artery calcification, mitral valve prolapse and history of lung nodule.  Previous cardiac catheterization on 02/26/2010 demonstrated right dominant system with mild irregularities however no significant stenosis.  Last echocardiogram obtained on 04/12/2019 showed EF 60 to 65%, mild basal septal hypertrophy, mild posterior leaflet mitral valve prolapse, myxomatous mitral valve with mild thickening of leaflet, mild to moderate MR, mild dilatation of the ascending aorta measuring at 41 mm.  CT of the chest obtained on 06/01/2019 showed minimal increase in the ascending thoracic aortic aneurysm measuring at 4.5 cm, new 4 mm right upper lobe pulmonary nodule, aortic atherosclerosis and two-vessel coronary artery calcification.  He was seen by Dr. Roxan Hockey in July 2020, Norvasc 5 mg daily was added to help with blood pressure control. Follow up CT in January 2021 was unchanged.   He was seen in the ED on 07/26/20 with atypical chest pain. Seen by cardiology coverage. Ecg was normal. Troponin was normal x 2. D dimer normal. No symptoms since then  On follow up today he is doing very well. No chest pain, SOB, palpitations, dizziness. Is very active.    Past Medical History:  Diagnosis Date  . Aortic root dilatation ([place])    a. 09/2010 CT: 4.1cm Max Asc Ao diam, 3.7cm Max Desc Ao diam;; s/p MRI/MRA in April 2013  . Back pain   . Cancer ([place])    skin cancer removed from face  . Chest pain    a. 02/2010 Cath: normal   . Decreased exercise tolerance    a. 02/2012  . GERD (gastroesophageal reflux disease)   . HTN (hypertension)   . Mitral valve prolapse   . Palpitations    a. h/o palps r/t caffeine - has occurred again 02/2012  . Pulmonary nodule    3 nodules removed over 3 years by Dr Roxan Hockey  . PVC's (premature ventricular contractions) 07/17/2015  . Vertigo    hx of    Past Surgical History:  Procedure Laterality Date  . BACK SURGERY  1983  . CARDIAC CATHETERIZATION     no PCI  . COLONOSCOPY W/ POLYPECTOMY    . EYE SURGERY  1997   remove foreign body; right/right eye is blurry  . VIDEO ASSISTED THORACOSCOPY (VATS)/WEDGE RESECTION Right 03/17/2014   Procedure: VIDEO ASSISTED THORACOSCOPY (VATS)/WEDGE RESECTION;  Surgeon: Melrose Nakayama, MD;  Location: [facility] OR;  Service: Thoracic;  Laterality: Right;    Current Medications: Current Meds  Medication Sig  . amLODipine (NORVASC) 5 MG tablet Take 1 tablet (5 mg total) by mouth daily.  Marland Kitchen aspirin 81 MG tablet Take 81 mg by mouth daily.  . Cholecalciferol (VITAMIN D-3 PO) Take 1,000 mg by mouth daily.   . Coenzyme Q10 200 MG capsule Take 200 mg by mouth daily.  . Cyanocobalamin (VITAMIN B-12 PO) Take 1 tablet by mouth daily.   Marland Kitchen lisinopril-hydrochlorothiazide (ZESTORETIC) 20-12.5 MG tablet Take 2 tablets by mouth daily.  . nitroGLYCERIN (NITROSTAT) 0.4 MG SL  tablet Place 1 tablet (0.4 mg total) under the tongue every 5 (five) minutes as needed.  . Probiotic Product (PROBIOTIC DAILY PO) Take 1 capsule by mouth daily. Reported on 01/15/2016     Allergies:   Beta adrenergic blockers and Codeine   Social History   Socioeconomic History  . Marital status: Married    Spouse name: Not on file  . Number of children: Not on file  . Years of education: Not on file  . Highest education level: Not on file  Occupational History  . Not on file  Tobacco Use  . Smoking status: Former Smoker    Packs/day: 1.00    Years: 30.00    Pack years:  30.00    Types: Cigarettes    Start date: 03/08/1962    Quit date: 03/08/1992    Years since quitting: 28.9  . Smokeless tobacco: Former Systems developer    Types: Snuff, Sarina Ser    Quit date: 01/06/2013  Vaping Use  . Vaping Use: Never used  Substance and Sexual Activity  . Alcohol use: No  . Drug use: No  . Sexual activity: Not Currently  Other Topics Concern  . Not on file  Social History Narrative  . Not on file   Social Determinants of Health   Financial Resource Strain: Not on file  Food Insecurity: Not on file  Transportation Needs: Not on file  Physical Activity: Not on file  Stress: Not on file  Social Connections: Not on file     Family History: The patient's family history includes Colon cancer (age of onset: 33) in his sister; Lung cancer in his mother.  ROS:   Please see the history of present illness.     All other systems reviewed and are negative.  EKGs/Labs/Other Studies Reviewed:    The following studies were reviewed today:  Echo 04/12/2019 IMPRESSIONS    1. The left ventricle has normal systolic function with an ejection fraction of 60-65%. The cavity size was normal. Mild basal septal hypertrophy. Left ventricular diastolic parameters were normal. No evidence of left ventricular regional wall motion  abnormalities.  2. The right ventricle has normal systolic function. The cavity was normal. There is no increase in right ventricular wall thickness.  3. Mild posterior leaflet mitral valve prolapse.  4. The mitral valve is myxomatous. Mild thickening of the mitral valve leaflet. Mitral valve regurgitation is mild to moderate by color flow Doppler. The MR jet is eccentric anteriorly directed.  5. The aortic valve is tricuspid. Mild sclerosis of the aortic valve. Aortic valve regurgitation is mild to moderate by color flow Doppler.  6. There is mild dilatation of the ascending aorta measuring 41 mm.   CT of chest 06/01/2019 IMPRESSION: 1. Minimal increase in size of  ascending thoracic aortic aneurysm which currently measures 4.5 cm in diameter (previously 4.4 cm). Ascending thoracic aortic aneurysm. Recommend semi-annual imaging followup by CTA or MRA and referral to cardiothoracic surgery if not already obtained. This recommendation follows 2010 ACCF/AHA/AATS/ACR/ASA/SCA/SCAI/SIR/STS/SVM Guidelines for the Diagnosis and Management of Patients With Thoracic Aortic Disease. Circulation. 2010; 121: J628-B151. Aortic aneurysm NOS (ICD10-I71.9). 2. Previously noted left upper lobe pulmonary nodule is stable dating back to 2017, considered benign. However, there is a new 4 mm right upper lobe pulmonary nodule (axial image 77 of series 8). This is nonspecific, but attention at time of follow-up examination is recommended to ensure stability of this finding. 3. Mild diffuse bronchial wall thickening with mild centrilobular and paraseptal emphysema; imaging  findings suggestive of underlying COPD. 4. Aortic atherosclerosis, in addition to left main and 2 vessel coronary artery disease. Please note that although the presence of coronary artery calcium documents the presence of coronary artery disease, the severity of this disease and any potential stenosis cannot be assessed on this non-gated CT examination. Assessment for potential risk factor modification, dietary therapy or pharmacologic therapy may be warranted, if clinically indicated.   EKG:  EKG is not ordered today.   Recent Labs: 07/26/2020: BUN 11; Creatinine, Ser 0.75; Hemoglobin 12.1; Magnesium 2.2; Platelets 122; Potassium 3.1; Sodium 141  Recent Lipid Panel    Component Value Date/Time   CHOL 127 09/29/2017 1009   TRIG 78 09/29/2017 1009   HDL 59 09/29/2017 1009   CHOLHDL 3 10/19/2012 0844   VLDL 22.8 10/19/2012 0844   LDLCALC 52 09/29/2017 1009   Dated 12/15/19: cholesterol 135, triglycerides 77, HDL 53, LDL 67. ALT normal. Dated 12/14/20: cholesterol 120, triglycerides 74, HDL 57,  LDL 48. CMET normal.   Ecg today shows NSR rate 54. First degree AV block. Otherwise normal.   CT ANGIOGRAPHY CHEST WITH CONTRAST  TECHNIQUE: Multidetector CT imaging of the chest was performed using the standard protocol during bolus administration of intravenous contrast. Multiplanar CT image reconstructions and MIPs were obtained to evaluate the vascular anatomy.  CONTRAST:  16mL ISOVUE-370 IOPAMIDOL (ISOVUE-370) INJECTION 76%  COMPARISON:  11/18/2019, 06/01/2019, 01/27/2018, 01/28/2017, most remote 09/24/2010  FINDINGS: Cardiovascular:  Heart:  No cardiomegaly. No pericardial fluid/thickening. No significant coronary calcifications.  Aorta:  No significant aortic valve calcifications.  Maximum diameter of the ascending aorta estimated 41 mm. Greatest diameter estimated on the most remote CT 09/24/2010 was 41 mm.  Mild atherosclerotic changes of the aortic arch. Branch vessels are patent. Three vessel arch. Mild atherosclerosis of the left subclavian artery without high-grade stenosis. The cervical cerebral vessels are patent. Dominant right vertebral artery.  No dissection or ulcerated plaque. Diameter of the thoracic aorta at the hiatus measures 28 mm.  Pulmonary arteries:  Timing of the contrast bolus is not optimized for evaluation of pulmonary artery filling defects.  Mediastinum/Nodes: No mediastinal adenopathy. Unremarkable appearance of the thoracic esophagus.  Unremarkable thoracic inlet.  Lungs/Pleura: Scarring/surgical changes at the apex of the right upper lobe, similar to the comparison. No new or enlarging nodularity or soft tissue. No pneumothorax or pleural effusion.  No confluent airspace disease.  Left upper lobe nodule with linear configuration, measuring longest diameter 8.8 mm, short axis 5.4 mm. Essentially this is unchanged dating to the CT of 04/30/2016 when it was identified. At that time the diameters estimated  6 mm x 10 mm.  No additional nodules.  Upper Abdomen: Calcifications within spleen parenchyma. No acute finding of the upper abdomen.  Musculoskeletal: Degenerative changes of the spine with disc space narrowing. Syndesmophyte formation along the ventral aspect of the thoracic spine. No bony canal narrowing. No displaced fracture.  Review of the MIP images confirms the above findings.  IMPRESSION: Diameter of the ascending aorta is essentially unchanged when compared to the most remote CT of 09/24/2010, with the greatest estimated diameter on the current CT approximately 41 mm.  The left upper lobe nodule is favored to be benign, given the relatively unchanged appearance/size over nearly 5 years dating to the most remote comparison CT of 04/25/2015.  Redemonstration of postsurgical changes of right upper lobe with associated scarring.  Aortic Atherosclerosis (ICD10-I70.0).  Signed,  Dulcy Fanny. Earleen Newport DO, RPVI  Physical Exam:    VS:  BP 132/60 (BP Location: Right Arm, Patient Position: Sitting)   Pulse 70   Ht 5\' 9"  (1.753 m)   Wt 152 lb 3.2 oz (69 kg)   SpO2 100%   BMI 22.48 kg/m     Wt Readings from Last 3 Encounters:  01/26/21 152 lb 3.2 oz (69 kg)  11/21/20 152 lb (68.9 kg)  07/28/20 151 lb 12.8 oz (68.9 kg)     GEN:  Well nourished, well developed in no acute distress HEENT: Normal NECK: No JVD; No carotid bruits LYMPHATICS: No lymphadenopathy CARDIAC: RRR, no murmurs, rubs, gallops RESPIRATORY:  Clear to auscultation without rales, wheezing or rhonchi  ABDOMEN: Soft, non-tender, non-distended MUSCULOSKELETAL:  No edema; No deformity  SKIN: Warm and dry NEUROLOGIC:  Alert and oriented x 3 PSYCHIATRIC:  Normal affect   ASSESSMENT:    1. Thoracic aortic aneurysm without rupture ([place])   2. Coronary artery calcification   3. Essential hypertension   4. PVC's (premature ventricular contractions)    PLAN:    In order of problems listed  above:  1.   Coronary artery calcification: Seen on last CT of chest in July 2020.  Denies any  chest discomfort. Focus on risk factor modification  3.  Thoracic aortic aneurysm: Followed by Dr. Roxan Hockey. Last CT in Jan. Showed stable dimension of 4.1 cm.  4.  History of PVCs: No significant palpitation recently  5. Mitral valve prolapse: mild   6. Lung nodule:   He is getting regular CT for thoracic aortic aneurysm as well.  7. HTN controlled.    Medication Adjustments/Labs and Tests Ordered: Current medicines are reviewed at length with the patient today.  Concerns regarding medicines are outlined above.  No orders of the defined types were placed in this encounter.  No orders of the defined types were placed in this encounter.   There are no Patient Instructions on file for this visit.  Follow up in one year  Signed, Peter Martinique, MD  01/26/2021 3:26 PM    [place]

## 2021-01-26 ENCOUNTER — Ambulatory Visit: Payer: Medicare HMO | Admitting: Cardiology

## 2021-01-26 ENCOUNTER — Other Ambulatory Visit: Payer: Self-pay

## 2021-01-26 ENCOUNTER — Encounter: Payer: Self-pay | Admitting: Cardiology

## 2021-01-26 VITALS — BP 132/60 | HR 70 | Ht 69.0 in | Wt 152.2 lb

## 2021-01-26 DIAGNOSIS — I2584 Coronary atherosclerosis due to calcified coronary lesion: Secondary | ICD-10-CM | POA: Diagnosis not present

## 2021-01-26 DIAGNOSIS — I2510 Atherosclerotic heart disease of native coronary artery without angina pectoris: Secondary | ICD-10-CM

## 2021-01-26 DIAGNOSIS — I712 Thoracic aortic aneurysm, without rupture, unspecified: Secondary | ICD-10-CM

## 2021-01-26 DIAGNOSIS — I493 Ventricular premature depolarization: Secondary | ICD-10-CM | POA: Diagnosis not present

## 2021-01-26 DIAGNOSIS — I10 Essential (primary) hypertension: Secondary | ICD-10-CM | POA: Diagnosis not present

## 2021-02-06 DIAGNOSIS — H5203 Hypermetropia, bilateral: Secondary | ICD-10-CM | POA: Diagnosis not present

## 2021-02-06 DIAGNOSIS — H52223 Regular astigmatism, bilateral: Secondary | ICD-10-CM | POA: Diagnosis not present

## 2021-02-06 DIAGNOSIS — B0052 Herpesviral keratitis: Secondary | ICD-10-CM | POA: Diagnosis not present

## 2021-02-06 DIAGNOSIS — H524 Presbyopia: Secondary | ICD-10-CM | POA: Diagnosis not present

## 2021-06-20 ENCOUNTER — Emergency Department (HOSPITAL_BASED_OUTPATIENT_CLINIC_OR_DEPARTMENT_OTHER)
Admission: EM | Admit: 2021-06-20 | Discharge: 2021-06-20 | Disposition: A | Discharge status: Home / Self Care | Payer: Medicare HMO | Attending: Emergency Medicine | Admitting: Emergency Medicine

## 2021-06-20 ENCOUNTER — Other Ambulatory Visit: Payer: Self-pay

## 2021-06-20 ENCOUNTER — Emergency Department (HOSPITAL_BASED_OUTPATIENT_CLINIC_OR_DEPARTMENT_OTHER): Payer: Medicare HMO

## 2021-06-20 ENCOUNTER — Encounter (HOSPITAL_BASED_OUTPATIENT_CLINIC_OR_DEPARTMENT_OTHER): Payer: Self-pay

## 2021-06-20 DIAGNOSIS — W208XXA Other cause of strike by thrown, projected or falling object, initial encounter: Secondary | ICD-10-CM | POA: Insufficient documentation

## 2021-06-20 DIAGNOSIS — Z79899 Other long term (current) drug therapy: Secondary | ICD-10-CM | POA: Diagnosis not present

## 2021-06-20 DIAGNOSIS — Z85828 Personal history of other malignant neoplasm of skin: Secondary | ICD-10-CM | POA: Insufficient documentation

## 2021-06-20 DIAGNOSIS — Z23 Encounter for immunization: Secondary | ICD-10-CM | POA: Diagnosis not present

## 2021-06-20 DIAGNOSIS — M47812 Spondylosis without myelopathy or radiculopathy, cervical region: Secondary | ICD-10-CM | POA: Diagnosis not present

## 2021-06-20 DIAGNOSIS — S50812A Abrasion of left forearm, initial encounter: Secondary | ICD-10-CM | POA: Diagnosis not present

## 2021-06-20 DIAGNOSIS — T148XXA Other injury of unspecified body region, initial encounter: Secondary | ICD-10-CM

## 2021-06-20 DIAGNOSIS — S0121XA Laceration without foreign body of nose, initial encounter: Secondary | ICD-10-CM | POA: Diagnosis not present

## 2021-06-20 DIAGNOSIS — Z7982 Long term (current) use of aspirin: Secondary | ICD-10-CM | POA: Diagnosis not present

## 2021-06-20 DIAGNOSIS — S0992XA Unspecified injury of nose, initial encounter: Secondary | ICD-10-CM | POA: Diagnosis not present

## 2021-06-20 DIAGNOSIS — Z87891 Personal history of nicotine dependence: Secondary | ICD-10-CM | POA: Insufficient documentation

## 2021-06-20 DIAGNOSIS — S0101XA Laceration without foreign body of scalp, initial encounter: Secondary | ICD-10-CM | POA: Diagnosis not present

## 2021-06-20 DIAGNOSIS — I10 Essential (primary) hypertension: Secondary | ICD-10-CM | POA: Diagnosis not present

## 2021-06-20 DIAGNOSIS — S0990XA Unspecified injury of head, initial encounter: Secondary | ICD-10-CM

## 2021-06-20 DIAGNOSIS — S0993XA Unspecified injury of face, initial encounter: Secondary | ICD-10-CM | POA: Diagnosis not present

## 2021-06-20 MED ORDER — TETANUS-DIPHTH-ACELL PERTUSSIS 5-2.5-18.5 LF-MCG/0.5 IM SUSY
0.5000 mL | PREFILLED_SYRINGE | Freq: Once | INTRAMUSCULAR | Status: AC
  Administered 2021-06-20: 0.5 mL via INTRAMUSCULAR
  Filled 2021-06-20: qty 0.5

## 2021-06-20 MED ORDER — CYCLOBENZAPRINE HCL 10 MG PO TABS: 10.0000 mg | ORAL_TABLET | Freq: Two times a day (BID) | ORAL | 0 refills | Status: DC | PRN

## 2021-06-20 NOTE — Discharge Instructions (Addendum)
Please follow up with your PCP regarding ED visit today Keep wounds clean and dry. You can apply bacitracin (neosporin) to them to help with healing.  I have prescribed a short course of muscle relaxer to take as needed as you will likely be very sore tomorrow. DO NOT DRIVE OR DRINK ALCOHOL WHILE ON THIS MEDICATION.  You can take Tylenol as needed for pain  Return to the ED for any new/worsening symptoms
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
## 2021-06-20 NOTE — ED Notes (Signed)
Pt ambulatory to restroom without assistance 

## 2021-06-20 NOTE — ED Provider Notes (Signed)
[facility] EMERGENCY DEPARTMENT Provider Note   CSN: [id number] Arrival date & time: 06/20/21  1438     History Chief Complaint  Patient presents with   Head Injury    Wayne Rodriguez is a 75 y.o. male with PMHx HTN, MVP, GERD who presents to the ED today s/p head injury that occurred earlier today.  Patient was helping a friend cut down trees today.  He states a large limb of the tree fell down hitting him in the head.  He states it brought him down to his knees however he denies loss of consciousness.  He complains of pain mostly to his neck at this time.  He is not anticoagulated.  He is unsure regarding tetanus status however does have abrasions to his head.  Denies any blurry vision,  nausea, vomiting, or any other associated symptoms.   The history is provided by the patient and medical records.      Past Medical History:  Diagnosis Date   Aortic root dilatation ([place])    a. 09/2010 CT: 4.1cm Max Asc Ao diam, 3.7cm Max Desc Ao diam;; s/p MRI/MRA in April 2013   Back pain    Cancer [facility])    skin cancer removed from face   Chest pain    a. 02/2010 Cath: normal   Decreased exercise tolerance    a. 02/2012   GERD (gastroesophageal reflux disease)    HTN (hypertension)    Mitral valve prolapse    Palpitations    a. h/o palps r/t caffeine - has occurred again 02/2012   Pulmonary nodule    3 nodules removed over 3 years by Dr Roxan Hockey   PVC's (premature ventricular contractions) 07/17/2015   Vertigo    hx of    Patient Active Problem List   Diagnosis Date Noted   PVC's (premature ventricular contractions) 07/17/2015   Mitral valve prolapse 07/17/2015   Benign tumor of lung 03/17/2014   Lung nodule 02/28/2014   Palpitations    Decreased exercise tolerance    Aortic root dilatation ([place]) 07/15/2011   HTN (hypertension) 07/15/2011   Chest pain 07/15/2011    Past Surgical History:  Procedure Laterality Date   BACK [place]      no PCI   COLONOSCOPY W/ [place]   remove foreign body; right/right eye is blurry   VIDEO ASSISTED THORACOSCOPY (VATS)/WEDGE RESECTION Right 03/17/2014   Procedure: VIDEO ASSISTED THORACOSCOPY (VATS)/WEDGE RESECTION;  Surgeon: Melrose Nakayama, MD;  Location: [place];  Service: Thoracic;  Laterality: Right;       Family History  Problem Relation Age of Onset   Lung cancer Mother    Colon cancer Sister 55    Social History   Tobacco Use   Smoking status: Former    Packs/day: 1.00    Years: 30.00    Pack years: 30.00    Types: Cigarettes    Start date: 03/08/1962    Quit date: 03/08/1992    Years since quitting: 29.3   Smokeless tobacco: Former    Types: Snuff, Chew    Quit date: 01/06/2013  Vaping Use   Vaping Use: Never used  Substance Use Topics   Alcohol use: No   Drug use: No    Home Medications Prior to Admission medications   Medication Sig Start Date End Date Taking? Authorizing Provider  cyclobenzaprine (FLEXERIL) 10 MG tablet Take 1 tablet (10 mg total) by  mouth 2 (two) times daily as needed for muscle spasms. 06/20/21  Yes Darbi Chandran, PA-C  amLODipine (NORVASC) 5 MG tablet Take 1 tablet (5 mg total) by mouth daily. 11/21/20   Melrose Nakayama, MD  aspirin 81 MG tablet Take 81 mg by mouth daily.    [provider]  Cholecalciferol (VITAMIN D-3 PO) Take 1,000 mg by mouth daily.     [provider]  Coenzyme Q10 200 MG capsule Take 200 mg by mouth daily.    [provider]  Cyanocobalamin (VITAMIN B-12 PO) Take 1 tablet by mouth daily.     [provider]  lisinopril-hydrochlorothiazide (ZESTORETIC) 20-12.5 MG tablet Take 2 tablets by mouth daily. 11/18/19   Almyra Deforest, PA  nitroGLYCERIN (NITROSTAT) 0.4 MG SL tablet Place 1 tablet (0.4 mg total) under the tongue every 5 (five) minutes as needed. 07/28/20   [place]ique, Peter M, MD  Probiotic Product (PROBIOTIC DAILY PO) Take 1 capsule by mouth daily.  Reported on 01/15/2016    [provider]    Allergies    Beta adrenergic blockers and Codeine  Review of Systems   Review of Systems  Eyes:  Negative for visual disturbance.  Gastrointestinal:  Negative for nausea and vomiting.  Musculoskeletal:  Positive for neck pain.  Neurological:  Positive for headaches. Negative for syncope.  Psychiatric/Behavioral:  Negative for confusion.   All other systems reviewed and are negative.  Physical Exam Updated Vital Signs BP (!) 138/51 (BP Location: Right Arm)   Pulse (!) 48   Temp 98.6 F (37 C) (Oral)   Resp 18   Ht '5\' 9"'$  (1.753 m)   Wt 69.4 kg   SpO2 100%   BMI 22.59 kg/m   Physical Exam Vitals and nursing note reviewed.  Constitutional:      Appearance: He is not ill-appearing or diaphoretic.  HENT:     Head: Normocephalic.     Comments: See photo below. Large abrasion to posterior aspect of scalp with small amount of bleed. Additional circular 1 cm in diameter abrasion to right posterior parietal area  Additional 0.5 cm laceration to bridge of nose with TTP Eyes:     Extraocular Movements: Extraocular movements intact.     Conjunctiva/sclera: Conjunctivae normal.     Comments: Oblong right pupil s/p prior injury/surgical repair No hyphema appreciated bilaterally. EOMIs  Neck:     Comments: + bilateral paracervical musculature TTP. ROM intact. Abrasions noted to distal aspect of neck on the right side Cardiovascular:     Rate and Rhythm: Normal rate and regular rhythm.     Pulses: Normal pulses.  Pulmonary:     Effort: Pulmonary effort is normal.     Breath sounds: Normal breath sounds. No wheezing, rhonchi or rales.  Abdominal:     Palpations: Abdomen is soft.     Tenderness: There is no abdominal tenderness.  Musculoskeletal:     Cervical back: Neck supple. Tenderness present.     Comments: Abrasions to left forearm  Skin:    General: Skin is warm and dry.  Neurological:     Mental Status: He is alert.       ED Results / Procedures / Treatments   Labs (all labs ordered are listed, but only abnormal results are displayed) Labs Reviewed - No data to display  EKG None  Radiology CT HEAD WO CONTRAST (5MM)  Result Date: 06/20/2021 CLINICAL DATA:  Head injury, facial trauma EXAM: CT HEAD WITHOUT CONTRAST CT MAXILLOFACIAL WITHOUT CONTRAST  CT CERVICAL SPINE WITHOUT CONTRAST TECHNIQUE: Multidetector CT imaging of the head, cervical spine, and maxillofacial structures were performed using the standard protocol without intravenous contrast. Multiplanar CT image reconstructions of the cervical spine and maxillofacial structures were also generated. COMPARISON:  None. FINDINGS: CT HEAD FINDINGS Brain: No evidence of acute infarction, hemorrhage, hydrocephalus, extra-axial collection or mass lesion/mass effect. Vascular: No hyperdense vessel. Intracranial carotid and vertebral artery calcifications. Skull: Normal. Negative for fracture or focal lesion. Other: None. CT MAXILLOFACIAL FINDINGS Osseous: No fracture or mandibular dislocation. No destructive process. Orbits: Postoperative changes in the right globe with scleral buckle, with a small amount of low-density material within the globe. Normal left globe. No acute process in the orbits. Sinuses: Clear. Soft tissues: No large soft tissue defect. No radiopaque foreign body. CT CERVICAL SPINE FINDINGS Alignment: Mild straightening of the normal cervical lordosis, which may be positional. No listhesis. Skull base and vertebrae: No acute fracture. No primary bone lesion or focal pathologic process. Soft tissues and spinal canal: No prevertebral fluid or swelling. No visible canal hematoma. Disc levels: Multilevel degenerative changes, with disc height loss throughout the mid and lower cervical spine, worst at C4-C5 and C5-C6. At least moderate spinal canal stenosis at C4-C5, C5-C6, and C6-C7. Severe neural foraminal narrowing at C5-C6. Upper chest: Negative.  Other: None. IMPRESSION: 1. No acute intracranial process. 2. No acute facial bone fracture. 3. No acute fracture or traumatic subluxation in the cervical spine. 4. Postoperative changes in the right globe with scleral buckle, with a small amount of low-density material within the globe, which may represent air. Correlate with physical exam and recent surgical history. Electronically Signed   By: Merilyn Baba M.D.   On: 06/20/2021 16:28   CT Cervical Spine Wo Contrast  Result Date: 06/20/2021 CLINICAL DATA:  Head injury, facial trauma EXAM: CT HEAD WITHOUT CONTRAST CT MAXILLOFACIAL WITHOUT CONTRAST CT CERVICAL SPINE WITHOUT CONTRAST TECHNIQUE: Multidetector CT imaging of the head, cervical spine, and maxillofacial structures were performed using the standard protocol without intravenous contrast. Multiplanar CT image reconstructions of the cervical spine and maxillofacial structures were also generated. COMPARISON:  None. FINDINGS: CT HEAD FINDINGS Brain: No evidence of acute infarction, hemorrhage, hydrocephalus, extra-axial collection or mass lesion/mass effect. Vascular: No hyperdense vessel. Intracranial carotid and vertebral artery calcifications. Skull: Normal. Negative for fracture or focal lesion. Other: None. CT MAXILLOFACIAL FINDINGS Osseous: No fracture or mandibular dislocation. No destructive process. Orbits: Postoperative changes in the right globe with scleral buckle, with a small amount of low-density material within the globe. Normal left globe. No acute process in the orbits. Sinuses: Clear. Soft tissues: No large soft tissue defect. No radiopaque foreign body. CT CERVICAL SPINE FINDINGS Alignment: Mild straightening of the normal cervical lordosis, which may be positional. No listhesis. Skull base and vertebrae: No acute fracture. No primary bone lesion or focal pathologic process. Soft tissues and spinal canal: No prevertebral fluid or swelling. No visible canal hematoma. Disc levels:  Multilevel degenerative changes, with disc height loss throughout the mid and lower cervical spine, worst at C4-C5 and C5-C6. At least moderate spinal canal stenosis at C4-C5, C5-C6, and C6-C7. Severe neural foraminal narrowing at C5-C6. Upper chest: Negative. Other: None. IMPRESSION: 1. No acute intracranial process. 2. No acute facial bone fracture. 3. No acute fracture or traumatic subluxation in the cervical spine. 4. Postoperative changes in the right globe with scleral buckle, with a small amount of low-density material within the globe, which may represent air. Correlate with physical exam and  recent surgical history. Electronically Signed   By: Merilyn Baba M.D.   On: 06/20/2021 16:28   CT Maxillofacial Wo Contrast  Result Date: 06/20/2021 CLINICAL DATA:  Head injury, facial trauma EXAM: CT HEAD WITHOUT CONTRAST CT MAXILLOFACIAL WITHOUT CONTRAST CT CERVICAL SPINE WITHOUT CONTRAST TECHNIQUE: Multidetector CT imaging of the head, cervical spine, and maxillofacial structures were performed using the standard protocol without intravenous contrast. Multiplanar CT image reconstructions of the cervical spine and maxillofacial structures were also generated. COMPARISON:  None. FINDINGS: CT HEAD FINDINGS Brain: No evidence of acute infarction, hemorrhage, hydrocephalus, extra-axial collection or mass lesion/mass effect. Vascular: No hyperdense vessel. Intracranial carotid and vertebral artery calcifications. Skull: Normal. Negative for fracture or focal lesion. Other: None. CT MAXILLOFACIAL FINDINGS Osseous: No fracture or mandibular dislocation. No destructive process. Orbits: Postoperative changes in the right globe with scleral buckle, with a small amount of low-density material within the globe. Normal left globe. No acute process in the orbits. Sinuses: Clear. Soft tissues: No large soft tissue defect. No radiopaque foreign body. CT CERVICAL SPINE FINDINGS Alignment: Mild straightening of the normal  cervical lordosis, which may be positional. No listhesis. Skull base and vertebrae: No acute fracture. No primary bone lesion or focal pathologic process. Soft tissues and spinal canal: No prevertebral fluid or swelling. No visible canal hematoma. Disc levels: Multilevel degenerative changes, with disc height loss throughout the mid and lower cervical spine, worst at C4-C5 and C5-C6. At least moderate spinal canal stenosis at C4-C5, C5-C6, and C6-C7. Severe neural foraminal narrowing at C5-C6. Upper chest: Negative. Other: None. IMPRESSION: 1. No acute intracranial process. 2. No acute facial bone fracture. 3. No acute fracture or traumatic subluxation in the cervical spine. 4. Postoperative changes in the right globe with scleral buckle, with a small amount of low-density material within the globe, which may represent air. Correlate with physical exam and recent surgical history. Electronically Signed   By: Merilyn Baba M.D.   On: 06/20/2021 16:28    Procedures .Marland KitchenLaceration Repair  Date/Time: 06/20/2021 5:24 PM Performed by: Eustaquio Maize, PA-C Authorized by: Eustaquio Maize, PA-C   Consent:    Consent obtained:  Verbal   Consent given by:  Patient Anesthesia:    Anesthesia method:  None Laceration details:    Location:  Face   Face location:  Nose   Length (cm):  0.5   Depth (mm):  1 Treatment:    Area cleansed with:  Shur-Clens Skin repair:    Repair method:  Tissue adhesive Approximation:    Approximation:  Close Repair type:    Repair type:  Simple Post-procedure details:    Dressing:  Open (no dressing)   Procedure completion:  Tolerated well, no immediate complications   Medications Ordered in ED Medications  Tdap (BOOSTRIX) injection 0.5 mL (0.5 mLs Intramuscular Given 06/20/21 1710)    ED Course  I have reviewed the triage vital signs and the nursing notes.  Pertinent labs & imaging results that were available during my care of the patient were reviewed by me and  considered in my medical decision making (see chart for details).    MDM Rules/Calculators/A&P                           75 year old male who presents to the ED today status post head injury where a large tree limb fell on his head, no loss of consciousness and is not anticoagulated.  On arrival to the ED vitals are  stable.  Patient had CT head, CT maxillofacial, CT C-spine ordered in triage.  No acute findings at this time.  On my exam patient has multiple abrasions to the posterior aspect of his head, minimal amount of bleeding.  He is also noted to have a 0.5 cm laceration to the bridge of his nose.  He has additional abrasions to his left forearm, denies pain to same.  And abrasions to the posterior aspect of his neck, distally to the right side.  He is unsure regarding tetanus status.  We will plan to update tetanus at this time.  Have discussed wound closure to bridge of nose, patient would like to try Dermabond at this time.  I think the wound is superficial enough that it will respond well to Dermabond.  Remainder of his abrasions do not require closure at this time.  We will plan for dressing.  Patient will likely be very sore tomorrow, mostly in his neck area related to limb falling on his head.  We will plan for short course of muscle relaxers.  Dermabond applied to nose. Remainder of wound dressed. Pt stable for discharge home at this time.   This note was prepared using Dragon voice recognition software and may include unintentional dictation errors due to the inherent limitations of voice recognition software.   Final Clinical Impression(s) / ED Diagnoses Final diagnoses:  Injury of head, initial encounter  Laceration of nose, initial encounter  Skin abrasion    Rx / DC Orders ED Discharge Orders          Ordered    cyclobenzaprine (FLEXERIL) 10 MG tablet  2 times daily PRN        06/20/21 1727             Discharge Instructions      Please follow up with your PCP  regarding ED visit today Keep wounds clean and dry. You can apply bacitracin (neosporin) to them to help with healing.  I have prescribed a short course of muscle relaxer to take as needed as you will likely be very sore tomorrow. DO NOT DRIVE OR DRINK ALCOHOL WHILE ON THIS MEDICATION.  You can take Tylenol as needed for pain  Return to the ED for any new/worsening symptoms       Eustaquio Maize, PA-C 06/20/21 1728    Tegeler, Gwenyth Allegra, MD 06/22/21 [phone]

## 2021-06-20 NOTE — ED Notes (Signed)
ED Provider at bedside speaking with patient in triage about plan of care

## 2021-08-13 ENCOUNTER — Encounter: Payer: Self-pay | Admitting: Gastroenterology

## 2021-08-27 ENCOUNTER — Other Ambulatory Visit: Payer: Self-pay | Admitting: Thoracic Surgery (Cardiothoracic Vascular Surgery)

## 2021-08-27 ENCOUNTER — Encounter: Payer: Self-pay | Admitting: Gastroenterology

## 2021-09-22 DIAGNOSIS — J209 Acute bronchitis, unspecified: Secondary | ICD-10-CM | POA: Diagnosis not present

## 2021-09-26 DIAGNOSIS — R053 Chronic cough: Secondary | ICD-10-CM | POA: Diagnosis not present

## 2021-09-26 DIAGNOSIS — I10 Essential (primary) hypertension: Secondary | ICD-10-CM | POA: Diagnosis not present

## 2021-09-26 DIAGNOSIS — J439 Emphysema, unspecified: Secondary | ICD-10-CM | POA: Diagnosis not present

## 2021-10-11 ENCOUNTER — Other Ambulatory Visit: Payer: Self-pay | Admitting: Thoracic Surgery (Cardiothoracic Vascular Surgery)

## 2021-10-11 DIAGNOSIS — I7121 Aneurysm of the ascending aorta, without rupture: Secondary | ICD-10-CM

## 2021-10-12 ENCOUNTER — Encounter: Payer: Self-pay | Admitting: Gastroenterology

## 2021-10-12 ENCOUNTER — Ambulatory Visit (AMBULATORY_SURGERY_CENTER): Payer: Self-pay

## 2021-10-12 ENCOUNTER — Other Ambulatory Visit: Payer: Self-pay

## 2021-10-12 VITALS — Ht 69.0 in | Wt 149.0 lb

## 2021-10-12 DIAGNOSIS — Z86010 Personal history of colonic polyps: Secondary | ICD-10-CM

## 2021-10-12 MED ORDER — PEG 3350-KCL-NA BICARB-NACL 420 G PO SOLR: 4000.0000 mL | Freq: Once | ORAL | 0 refills | Status: AC

## 2021-10-12 NOTE — Progress Notes (Signed)
No allergies to soy or egg Pt is not on blood thinners or diet pills Denies issues with sedation/intubation Denies atrial flutter/fib Denies constipation   Emmi instructions given to pt  Pt is aware of Covid safety and care partner requirements.  

## 2021-10-25 ENCOUNTER — Encounter: Payer: Self-pay | Admitting: Certified Registered Nurse Anesthetist

## 2021-10-26 ENCOUNTER — Ambulatory Visit (AMBULATORY_SURGERY_CENTER): Payer: Medicare HMO | Admitting: Gastroenterology

## 2021-10-26 ENCOUNTER — Encounter: Payer: Self-pay | Admitting: Gastroenterology

## 2021-10-26 VITALS — BP 132/56 | HR 45 | Temp 98.3°F | Resp 13 | Ht 69.0 in | Wt 149.0 lb

## 2021-10-26 DIAGNOSIS — I10 Essential (primary) hypertension: Secondary | ICD-10-CM | POA: Diagnosis not present

## 2021-10-26 DIAGNOSIS — Z86010 Personal history of colonic polyps: Secondary | ICD-10-CM

## 2021-10-26 MED ORDER — SODIUM CHLORIDE 0.9 % IV SOLN: 500.0000 mL | Freq: Once | INTRAVENOUS | Status: DC

## 2021-10-26 NOTE — Progress Notes (Signed)
HPI: This is a man Personal history of colonic polyps; sister with colon cancer in her 62s, colonoscopy 2009 three subCM adenomas,colonoscopy 2014 one subCM adenoma. Colonsocopy 08/2018 five subCM polyps, most were TAs  ROS: complete GI ROS as described in HPI, all other review negative.  Constitutional:  No unintentional weight loss   Past Medical History:  Diagnosis Date   Aortic root dilatation ([place])    a. 09/2010 CT: 4.1cm Max Asc Ao diam, 3.7cm Max Desc Ao diam;; s/p MRI/MRA in April 2013   Back pain    Cancer [facility])    skin cancer removed from face   Chest pain    a. 02/2010 Cath: normal   Decreased exercise tolerance    a. 02/2012   GERD (gastroesophageal reflux disease)    HTN (hypertension)    Mitral valve prolapse    Palpitations    a. h/o palps r/t caffeine - has occurred again 02/2012   Pulmonary nodule    3 nodules removed over 3 years by Dr Roxan Hockey   PVC's (premature ventricular contractions) 07/17/2015   Vertigo    hx of    Past Surgical History:  Procedure Laterality Date   [place]     no PCI   COLONOSCOPY     COLONOSCOPY W/ [place]   remove foreign body; right/right eye is blurry   VIDEO ASSISTED THORACOSCOPY (VATS)/WEDGE RESECTION Right 03/17/2014   Procedure: VIDEO ASSISTED THORACOSCOPY (VATS)/WEDGE RESECTION;  Surgeon: Melrose Nakayama, MD;  Location: [place];  Service: Thoracic;  Laterality: Right;    Current Outpatient Medications  Medication Sig Dispense Refill   amLODipine (NORVASC) 5 MG tablet Take 1 tablet (5 mg total) by mouth daily. 90 tablet 2   aspirin 81 MG tablet Take 81 mg by mouth daily.     Coenzyme Q10 200 MG capsule Take 200 mg by mouth daily.     Cyanocobalamin (VITAMIN B-12 PO) Take 1 tablet by mouth daily.      lisinopril-hydrochlorothiazide (ZESTORETIC) 20-12.5 MG tablet Take 2 tablets by mouth daily. 180 tablet 3   Probiotic Product (PROBIOTIC DAILY PO) Take 1  capsule by mouth daily. Reported on 01/15/2016     Cholecalciferol (VITAMIN D-3 PO) Take 1,000 mg by mouth daily.      cyclobenzaprine (FLEXERIL) 10 MG tablet Take 1 tablet (10 mg total) by mouth 2 (two) times daily as needed for muscle spasms. 20 tablet 0   nitroGLYCERIN (NITROSTAT) 0.4 MG SL tablet Place 1 tablet (0.4 mg total) under the tongue every 5 (five) minutes as needed. 25 tablet 11   Current Facility-Administered Medications  Medication Dose Route Frequency Provider Last Rate Last Admin   0.9 %  sodium chloride infusion  500 mL Intravenous Once Milus Banister, MD        Allergies as of 10/26/2021 - Review Complete 10/26/2021  Allergen Reaction Noted   Beta adrenergic blockers  02/17/2012   Codeine  07/19/2008    Family History  Problem Relation Age of Onset   Lung cancer Mother    Colon cancer Sister 66   Colon polyps Neg Hx    Esophageal cancer Neg Hx    Rectal cancer Neg Hx    Stomach cancer Neg Hx     Social History   Socioeconomic History   Marital status: Married    Spouse name: Not on file   Number of children: Not on file  Years of education: Not on file   Highest education level: Not on file  Occupational History   Not on file  Tobacco Use   Smoking status: Former    Packs/day: 1.00    Years: 30.00    Pack years: 30.00    Types: Cigarettes    Start date: 03/08/1962    Quit date: 03/08/1992    Years since quitting: 29.6   Smokeless tobacco: Former    Types: Snuff, Sarina Ser    Quit date: 01/06/2013  Vaping Use   Vaping Use: Never used  Substance and Sexual Activity   Alcohol use: No   Drug use: No   Sexual activity: Not Currently  Other Topics Concern   Not on file  Social History Narrative   Not on file   Social Determinants of Health   Financial Resource Strain: Not on file  Food Insecurity: Not on file  Transportation Needs: Not on file  Physical Activity: Not on file  Stress: Not on file  Social Connections: Not on file  Intimate Partner  Violence: Not on file     Physical Exam: BP (!) 152/73    Pulse 63    Temp 98.3 F (36.8 C)    Ht 5\' 9"  (1.753 m)    Wt 149 lb (67.6 kg)    SpO2 100%    BMI 22.00 kg/m  Constitutional: generally well-appearing Psychiatric: alert and oriented x3 Lungs: CTA bilaterally Heart: no MCR  Assessment and plan: 75 y.o. male with h/o polyps, [facility]  Colonsocopy today  Care is appropriate for the ambulatory setting.  Owens Loffler, MD [facility] 10/26/2021, 8:58 AM

## 2021-10-26 NOTE — Op Note (Signed)
[employer] Patient Name: Wayne Rodriguez Procedure Date: 10/26/2021 8:57 AM MRN: [id number] Endoscopist: Milus Banister , MD Age: 75 Referring MD:  Date of Birth: [birth year] Gender: Male Account #: [id number] Procedure:                Colonoscopy Indications:              High risk colon cancer surveillance: Personal                            history of colonic polyps; sister with colon cancer                            in her 67s, colonoscopy 2009 three subCM                            adenomas,colonoscopy 2014 one subCM adenoma.                            Colonsocopy 08/2018 five subCM polyps, most were TAs Medicines:                Monitored Anesthesia Care Procedure:                Pre-Anesthesia Assessment:                           - Prior to the procedure, a History and Physical                            was performed, and patient medications and                            allergies were reviewed. The patient's tolerance of                            previous anesthesia was also reviewed. The risks                            and benefits of the procedure and the sedation                            options and risks were discussed with the patient.                            All questions were answered, and informed consent                            was obtained. Prior Anticoagulants: The patient has                            taken no previous anticoagulant or antiplatelet                            agents. ASA Grade Assessment: II - A patient with  mild systemic disease. After reviewing the risks                            and benefits, the patient was deemed in                            satisfactory condition to undergo the procedure.                           After obtaining informed consent, the colonoscope                            was passed under direct vision. Throughout the                            procedure, the patient's  blood pressure, pulse, and                            oxygen saturations were monitored continuously. The                            Olympus Colonoscope #[id number] was introduced through                            the anus and advanced to the the cecum, identified                            by appendiceal orifice and ileocecal valve. The                            colonoscopy was performed without difficulty. The                            patient tolerated the procedure well. The quality                            of the bowel preparation was good. The ileocecal                            valve, appendiceal orifice, and rectum were                            photographed. Scope In: 9:04:53 AM Scope Out: 9:15:21 AM Scope Withdrawal Time: 0 hours 7 minutes 2 seconds  Total Procedure Duration: 0 hours 10 minutes 28 seconds  Findings:                 Internal hemorrhoids were found. The hemorrhoids                            were small.                           The exam was otherwise without abnormality on  direct and retroflexion views. Complications:            No immediate complications. Estimated blood loss:                            None. Estimated Blood Loss:     Estimated blood loss: none. Impression:               - Internal hemorrhoids.                           - The examination was otherwise normal on direct                            and retroflexion views.                           - No polyps or cancers. Recommendation:           - Patient has a contact number available for                            emergencies. The signs and symptoms of potential                            delayed complications were discussed with the                            patient. Return to normal activities tomorrow.                            Written discharge instructions were provided to the                            patient.                           - Resume previous  diet.                           - Continue present medications.                           - No need for further colon cancer screening or                            polyp surveillance given age is 35 currently. Milus Banister, MD 10/26/2021 9:20:06 AM This report has been signed electronically.

## 2021-10-26 NOTE — Progress Notes (Signed)
Vs CW  Pt's states no medical or surgical changes since previsit or office visit.  

## 2021-10-26 NOTE — Progress Notes (Signed)
Report given to PACU, vss 

## 2021-10-26 NOTE — Patient Instructions (Signed)
Information given to you on hemorrhoids.  Resume previous diet and medications.   YOU HAD AN ENDOSCOPIC PROCEDURE TODAY AT [facility]:   Refer to the procedure report that was given to you for any specific questions about what was found during the examination.  If the procedure report does not answer your questions, please call your gastroenterologist to clarify.  If you requested that your care partner not be given the details of your procedure findings, then the procedure report has been included in a sealed envelope for you to review at your convenience later.  YOU SHOULD EXPECT: Some feelings of bloating in the abdomen. Passage of more gas than usual.  Walking can help get rid of the air that was put into your GI tract during the procedure and reduce the bloating. If you had a lower endoscopy (such as a colonoscopy or flexible sigmoidoscopy) you may notice spotting of blood in your stool or on the toilet paper. If you underwent a bowel prep for your procedure, you may not have a normal bowel movement for a few days.  Please Note:  You might notice some irritation and congestion in your nose or some drainage.  This is from the oxygen used during your procedure.  There is no need for concern and it should clear up in a day or so.  SYMPTOMS TO REPORT IMMEDIATELY:  Following lower endoscopy (colonoscopy or flexible sigmoidoscopy):  Excessive amounts of blood in the stool  Significant tenderness or worsening of abdominal pains  Swelling of the abdomen that is new, acute  Fever of 100F or higher   For urgent or emergent issues, a gastroenterologist can be reached at any hour by calling [phone]. Do not use MyChart messaging for urgent concerns.    DIET:  We do recommend a small meal at first, but then you may proceed to your regular diet.  Drink plenty of fluids but you should avoid alcoholic beverages for 24 hours.  ACTIVITY:  You should plan to take it easy for the  rest of today and you should NOT DRIVE or use heavy machinery until tomorrow (because of the sedation medicines used during the test).    FOLLOW UP: Our staff will call the number listed on your records 48-72 hours following your procedure to check on you and address any questions or concerns that you may have regarding the information given to you following your procedure. If we do not reach you, we will leave a message.  We will attempt to reach you two times.  During this call, we will ask if you have developed any symptoms of COVID 19. If you develop any symptoms (ie: fever, flu-like symptoms, shortness of breath, cough etc.) before then, please call [phone].  If you test positive for Covid 19 in the 2 weeks post procedure, please call and report this information to [place].    If any biopsies were taken you will be contacted by phone or by letter within the next 1-3 weeks.  Please call us at [phone] if you have not heard about the biopsies in 3 weeks.    SIGNATURES/CONFIDENTIALITY: You and/or your care partner have signed paperwork which will be entered into your electronic medical record.  These signatures attest to the fact that that the information above on your After Visit Summary has been reviewed and is understood.  Full responsibility of the confidentiality of this discharge information lies with you and/or your care-partner.

## 2021-10-31 ENCOUNTER — Telehealth: Payer: Self-pay

## 2021-10-31 NOTE — Telephone Encounter (Signed)
Left message on follow up call. 

## 2021-11-27 ENCOUNTER — Ambulatory Visit: Payer: Medicare HMO | Admitting: Surgical

## 2021-11-27 ENCOUNTER — Other Ambulatory Visit: Payer: Self-pay

## 2021-11-27 ENCOUNTER — Ambulatory Visit
Admission: RE | Admit: 2021-11-27 | Discharge: 2021-11-27 | Disposition: A | Discharge status: Home / Self Care | Payer: Medicare HMO | Source: Ambulatory Visit | Attending: Thoracic Surgery (Cardiothoracic Vascular Surgery) | Admitting: Thoracic Surgery (Cardiothoracic Vascular Surgery)

## 2021-11-27 VITALS — BP 134/70 | HR 65 | Resp 20 | Ht 69.0 in | Wt 148.0 lb

## 2021-11-27 DIAGNOSIS — R911 Solitary pulmonary nodule: Secondary | ICD-10-CM | POA: Diagnosis not present

## 2021-11-27 DIAGNOSIS — I712 Thoracic aortic aneurysm, without rupture, unspecified: Secondary | ICD-10-CM | POA: Diagnosis not present

## 2021-11-27 DIAGNOSIS — I7121 Aneurysm of the ascending aorta, without rupture: Secondary | ICD-10-CM

## 2021-11-27 MED ORDER — IOPAMIDOL (ISOVUE-370) INJECTION 76%
75.0000 mL | Freq: Once | INTRAVENOUS | Status: AC | PRN
  Administered 2021-11-27: 75 mL via INTRAVENOUS

## 2021-11-27 NOTE — Progress Notes (Signed)
Subjective:    Patient ID: Wayne Rodriguez, male    DOB: 11-Jul-1946, 76 y.o.   MRN: [id number]  Chief Complaint  Patient presents with   Thoracic Aortic Aneurysm    CTA chest today    HPI Patient is in today for routine follow-up of his thoracic aneurysm which Dr. Roxan Hockey has been following for many years.  Overall patient feels well.  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  He denies any chest pain, shortness of breath, palpitations, lower extremity edema.  Overall he feels well although at times he does wish he had a little more energy.  Past Medical History:  Diagnosis Date   Aortic root dilatation ([place])    a. 09/2010 CT: 4.1cm Max Asc Ao diam, 3.7cm Max Desc Ao diam;; s/p MRI/MRA in April 2013   Back pain    Cancer [facility])    skin cancer removed from face   Chest pain    a. 02/2010 Cath: normal   Decreased exercise tolerance    a. 02/2012   GERD (gastroesophageal reflux disease)    HTN (hypertension)    Mitral valve prolapse    Palpitations    a. h/o palps r/t caffeine - has occurred again 02/2012   Pulmonary nodule    3 nodules removed over 3 years by Dr Roxan Hockey   PVC's (premature ventricular contractions) 07/17/2015   Vertigo    hx of    Past Surgical History:  Procedure Laterality Date   [place]     no PCI   COLONOSCOPY     COLONOSCOPY W/ [place]   remove foreign body; right/right eye is blurry   VIDEO ASSISTED THORACOSCOPY (VATS)/WEDGE RESECTION Right 03/17/2014   Procedure: VIDEO ASSISTED THORACOSCOPY (VATS)/WEDGE RESECTION;  Surgeon: Melrose Nakayama, MD;  Location: [place];  Service: Thoracic;  Laterality: Right;    Family History  Problem Relation Age of Onset   Lung cancer Mother    Colon cancer Sister 16   Colon polyps Neg Hx    Esophageal cancer Neg Hx    Rectal cancer Neg Hx    Stomach cancer Neg Hx     Social History   Socioeconomic History   Marital status:  Married    Spouse name: Not on file   Number of children: Not on file   Years of education: Not on file   Highest education level: Not on file  Occupational History   Not on file  Tobacco Use   Smoking status: Former    Packs/day: 1.00    Years: 30.00    Pack years: 30.00    Types: Cigarettes    Start date: 03/08/1962    Quit date: 03/08/1992    Years since quitting: 29.7   Smokeless tobacco: Former    Types: Snuff, Chew    Quit date: 01/06/2013  Vaping Use   Vaping Use: Never used  Substance and Sexual Activity   Alcohol use: No   Drug use: No   Sexual activity: Not Currently  Other Topics Concern   Not on file  Social History Narrative   Not on file   Social Determinants of Health   Financial Resource Strain: Not on file  Food Insecurity: Not on file  Transportation Needs: Not on file  Physical Activity: Not on file  Stress: Not on file  Social Connections: Not on file  Intimate Partner  Violence: Not on file    Outpatient Medications Prior to Visit  Medication Sig Dispense Refill   amLODipine (NORVASC) 5 MG tablet Take 1 tablet (5 mg total) by mouth daily. 90 tablet 2   aspirin 81 MG tablet Take 81 mg by mouth daily.     Cholecalciferol (VITAMIN D-3 PO) Take 1,000 mg by mouth daily.      Coenzyme Q10 200 MG capsule Take 200 mg by mouth daily.     Cyanocobalamin (VITAMIN B-12 PO) Take 1 tablet by mouth daily.      cyclobenzaprine (FLEXERIL) 10 MG tablet Take 1 tablet (10 mg total) by mouth 2 (two) times daily as needed for muscle spasms. 20 tablet 0   lisinopril-hydrochlorothiazide (ZESTORETIC) 20-12.5 MG tablet Take 2 tablets by mouth daily. 180 tablet 3   nitroGLYCERIN (NITROSTAT) 0.4 MG SL tablet Place 1 tablet (0.4 mg total) under the tongue every 5 (five) minutes as needed. 25 tablet 11   Probiotic Product (PROBIOTIC DAILY PO) Take 1 capsule by mouth daily. Reported on 01/15/2016     No facility-administered medications prior to visit.    Allergies  Allergen  Reactions   Beta Adrenergic Blockers     Bradycardia  Other reaction(s): bradycardia   Codeine     REACTION: nausea/vomiting        Objective:    Physical Exam Constitutional:      General: He is not in acute distress.    Appearance: Normal appearance. He is normal weight.  HENT:     Head: Normocephalic and atraumatic.  Cardiovascular:     Rate and Rhythm: Normal rate and regular rhythm.     Heart sounds: No murmur heard. Pulmonary:     Effort: Pulmonary effort is normal.     Breath sounds: Normal breath sounds.  Abdominal:     General: Abdomen is flat. There is no distension.     Palpations: Abdomen is soft. There is no mass.  Musculoskeletal:        General: No swelling or tenderness.     Right lower leg: No edema.     Left lower leg: No edema.  Skin:    General: Skin is warm and dry.     Capillary Refill: Capillary refill takes less than 2 seconds.  Neurological:     General: No focal deficit present.     Mental Status: He is alert.  Psychiatric:        Mood and Affect: Mood normal.    Ht 5\' 9"  (1.753 m)    BMI 22.00 kg/m  Wt Readings from Last 3 Encounters:  10/26/21 149 lb (67.6 kg)  10/12/21 149 lb (67.6 kg)  06/20/21 153 lb (69.4 kg)    Health Maintenance Due  Topic Date Due   Pneumonia Vaccine 62+ Years old (1 - PCV) Never done   Hepatitis C Screening  Never done   Zoster Vaccines- Shingrix (1 of 2) Never done   COVID-19 Vaccine (3 - Pfizer risk series) 08/28/2020   INFLUENZA VACCINE  06/04/2021    There are no preventive care reminders to display for this patient.   Lab Results  Component Value Date   TSH 1.50 02/17/2012   Lab Results  Component Value Date   WBC 5.5 07/26/2020   HGB 12.1 (L) 07/26/2020   HCT 37.5 (L) 07/26/2020   MCV 87.6 07/26/2020   PLT 122 (L) 07/26/2020   Lab Results  Component Value Date   NA 141 07/26/2020   K 3.1 (  L) 07/26/2020   CO2 25 07/26/2020   GLUCOSE 82 07/26/2020   BUN 11 07/26/2020   CREATININE  0.75 07/26/2020   BILITOT 0.4 09/29/2017   ALKPHOS 58 09/29/2017   AST 17 09/29/2017   ALT 13 09/29/2017   PROT 6.6 09/29/2017   ALBUMIN 4.4 09/29/2017   CALCIUM 7.6 (L) 07/26/2020   ANIONGAP 6 07/26/2020   GFR 81.09 02/16/2014   Lab Results  Component Value Date   CHOL 127 09/29/2017   Lab Results  Component Value Date   HDL 59 09/29/2017   Lab Results  Component Value Date   LDLCALC 52 09/29/2017   Lab Results  Component Value Date   TRIG 78 09/29/2017   Lab Results  Component Value Date   CHOLHDL 3 10/19/2012   Lab Results  Component Value Date   HGBA1C  02/24/2010    5.1 (NOTE)                                                                       According to the ADA Clinical Practice Recommendations for 2011, when HbA1c is used as a screening test:   >=6.5%   Diagnostic of Diabetes Mellitus           (if abnormal result  is confirmed)  5.7-6.4%   Increased risk of developing Diabetes Mellitus  References:Diagnosis and Classification of Diabetes Mellitus,Diabetes YQMV,7846,96(EXBMW 1):S62-S69 and Standards of Medical Care in         Diabetes - 2011,Diabetes Care,2011,34  (Suppl 1):S11-S61.  CT ANGIO CHEST AORTA W/CM & OR WO/CM  Result Date: 11/27/2021 CLINICAL DATA:  Thoracic aortic aneurysm. EXAM: CT ANGIOGRAPHY CHEST WITH CONTRAST TECHNIQUE: Multidetector CT imaging of the chest was performed using the standard protocol during bolus administration of intravenous contrast. Multiplanar CT image reconstructions and MIPs were obtained to evaluate the vascular anatomy. RADIATION DOSE REDUCTION: This exam was performed according to the departmental dose-optimization program which includes automated exposure control, adjustment of the mA and/or kV according to patient size and/or use of iterative reconstruction technique. CONTRAST:  54mL ISOVUE-370 IOPAMIDOL (ISOVUE-370) INJECTION 76% COMPARISON:  November 14, 2020. FINDINGS: Cardiovascular: Grossly stable 4.2 cm ascending  thoracic aortic aneurysm is noted. No dissection is noted. Atherosclerosis of thoracic aorta is noted. Proximal descending thoracic aorta measures 3.8 cm. Great vessels are widely patent without significant stenosis. Normal cardiac size. No pericardial effusion. Mediastinum/Nodes: No enlarged mediastinal, hilar, or axillary lymph nodes. Thyroid gland, trachea, and esophagus demonstrate no significant findings. Lungs/Pleura: No pneumothorax or pleural effusion is noted. Stable right apical scarring is noted. Grossly stable 8 x 5 mm left upper lobe nodule is noted best seen on image number 38 of series 6. Upper Abdomen: No acute abnormality. Musculoskeletal: No chest wall abnormality. No acute or significant osseous findings. Review of the MIP images confirms the above findings. IMPRESSION: Grossly stable 4.2 cm ascending thoracic aortic aneurysm. Recommend annual imaging followup by CTA or MRA. This recommendation follows 2010 ACCF/AHA/AATS/ACR/ASA/SCA/SCAI/SIR/STS/SVM Guidelines for the Diagnosis and Management of Patients with Thoracic Aortic Disease. Circulation. 2010; 121: U132-G401. Aortic aneurysm NOS (ICD10-I71.9). Grossly stable 8 x 5 mm left upper lobe nodule. Attention to this on follow-up imaging is recommended. Aortic Atherosclerosis (ICD10-I70.0). Electronically Signed   By: Jeneen Rinks  Murlean Caller M.D.   On: 11/27/2021 12:20        Assessment & Plan:   Problem List Items Addressed This Visit   None Visit Diagnoses     Thoracic aortic aneurysm without rupture, unspecified part    -  Primary     A/P aneurysm is very stable in appearance.  He remains asymptomatic in this regard.  We discussed ongoing lifestyle and health care management including close control of blood pressure.  We will see the patient again in 1 year with a repeat CTA of the chest.   No orders of the defined types were placed in this encounter.    [name], PA-C

## 2021-11-27 NOTE — Patient Instructions (Signed)
Health and lifestyle management for aneurysmal disease

## 2021-12-04 ENCOUNTER — Emergency Department (HOSPITAL_BASED_OUTPATIENT_CLINIC_OR_DEPARTMENT_OTHER)
Admission: EM | Admit: 2021-12-04 | Discharge: 2021-12-04 | Discharge status: Against Medical Advice | Payer: Medicare HMO

## 2021-12-04 ENCOUNTER — Other Ambulatory Visit: Payer: Self-pay

## 2021-12-04 NOTE — ED Triage Notes (Signed)
Pt states he cut his thumb on a steak knife-superficial lac to side of thumb-slight oozing at present-2x2/coban applied-states he does not want to be seen due to he has business to attend to-NAD/pleasant

## 2021-12-06 DIAGNOSIS — S61219A Laceration without foreign body of unspecified finger without damage to nail, initial encounter: Secondary | ICD-10-CM | POA: Diagnosis not present

## 2021-12-19 DIAGNOSIS — J439 Emphysema, unspecified: Secondary | ICD-10-CM | POA: Diagnosis not present

## 2021-12-19 DIAGNOSIS — Z125 Encounter for screening for malignant neoplasm of prostate: Secondary | ICD-10-CM | POA: Diagnosis not present

## 2021-12-19 DIAGNOSIS — I712 Thoracic aortic aneurysm, without rupture, unspecified: Secondary | ICD-10-CM | POA: Diagnosis not present

## 2021-12-19 DIAGNOSIS — Z0000 Encounter for general adult medical examination without abnormal findings: Secondary | ICD-10-CM | POA: Diagnosis not present

## 2021-12-19 DIAGNOSIS — Z23 Encounter for immunization: Secondary | ICD-10-CM | POA: Diagnosis not present

## 2021-12-19 DIAGNOSIS — I700 Atherosclerosis of aorta: Secondary | ICD-10-CM | POA: Diagnosis not present

## 2021-12-19 DIAGNOSIS — I10 Essential (primary) hypertension: Secondary | ICD-10-CM | POA: Diagnosis not present

## 2021-12-19 DIAGNOSIS — I2510 Atherosclerotic heart disease of native coronary artery without angina pectoris: Secondary | ICD-10-CM | POA: Diagnosis not present

## 2022-01-01 ENCOUNTER — Encounter (HOSPITAL_BASED_OUTPATIENT_CLINIC_OR_DEPARTMENT_OTHER): Payer: Self-pay

## 2022-01-01 ENCOUNTER — Telehealth: Payer: Self-pay | Admitting: Cardiology

## 2022-01-01 ENCOUNTER — Emergency Department (HOSPITAL_BASED_OUTPATIENT_CLINIC_OR_DEPARTMENT_OTHER): Payer: Medicare HMO

## 2022-01-01 ENCOUNTER — Other Ambulatory Visit: Payer: Self-pay

## 2022-01-01 ENCOUNTER — Observation Stay (HOSPITAL_BASED_OUTPATIENT_CLINIC_OR_DEPARTMENT_OTHER)
Admission: EM | Admit: 2022-01-01 | Discharge: 2022-01-03 | Disposition: A | Discharge status: Home / Self Care | Payer: Medicare HMO | Attending: Family Medicine | Admitting: Family Medicine

## 2022-01-01 DIAGNOSIS — R0609 Other forms of dyspnea: Secondary | ICD-10-CM | POA: Diagnosis not present

## 2022-01-01 DIAGNOSIS — R0602 Shortness of breath: Secondary | ICD-10-CM | POA: Insufficient documentation

## 2022-01-01 DIAGNOSIS — E876 Hypokalemia: Secondary | ICD-10-CM

## 2022-01-01 DIAGNOSIS — Z20822 Contact with and (suspected) exposure to covid-19: Secondary | ICD-10-CM | POA: Insufficient documentation

## 2022-01-01 DIAGNOSIS — Z87891 Personal history of nicotine dependence: Secondary | ICD-10-CM | POA: Insufficient documentation

## 2022-01-01 DIAGNOSIS — R059 Cough, unspecified: Secondary | ICD-10-CM | POA: Insufficient documentation

## 2022-01-01 DIAGNOSIS — I10 Essential (primary) hypertension: Secondary | ICD-10-CM | POA: Diagnosis not present

## 2022-01-01 DIAGNOSIS — Z7982 Long term (current) use of aspirin: Secondary | ICD-10-CM | POA: Diagnosis not present

## 2022-01-01 DIAGNOSIS — Z85828 Personal history of other malignant neoplasm of skin: Secondary | ICD-10-CM | POA: Insufficient documentation

## 2022-01-01 DIAGNOSIS — I7121 Aneurysm of the ascending aorta, without rupture: Secondary | ICD-10-CM

## 2022-01-01 DIAGNOSIS — R911 Solitary pulmonary nodule: Secondary | ICD-10-CM | POA: Diagnosis not present

## 2022-01-01 DIAGNOSIS — Z79899 Other long term (current) drug therapy: Secondary | ICD-10-CM | POA: Diagnosis not present

## 2022-01-01 DIAGNOSIS — R079 Chest pain, unspecified: Secondary | ICD-10-CM | POA: Diagnosis not present

## 2022-01-01 DIAGNOSIS — R001 Bradycardia, unspecified: Secondary | ICD-10-CM | POA: Diagnosis not present

## 2022-01-01 DIAGNOSIS — J9811 Atelectasis: Secondary | ICD-10-CM | POA: Diagnosis not present

## 2022-01-01 LAB — CBC
HCT: 42.4 % (ref 39.0–52.0)
Hemoglobin: 14.4 g/dL (ref 13.0–17.0)
MCH: 28.7 pg (ref 26.0–34.0)
MCHC: 34 g/dL (ref 30.0–36.0)
MCV: 84.5 fL (ref 80.0–100.0)
Platelets: 160 10*3/uL (ref 150–400)
RBC: 5.02 MIL/uL (ref 4.22–5.81)
RDW: 13.2 % (ref 11.5–15.5)
WBC: 6.4 10*3/uL (ref 4.0–10.5)
nRBC: 0 % (ref 0.0–0.2)

## 2022-01-01 LAB — BASIC METABOLIC PANEL
Anion gap: 7 (ref 5–15)
BUN: 18 mg/dL (ref 8–23)
CO2: 29 mmol/L (ref 22–32)
Calcium: 9 mg/dL (ref 8.9–10.3)
Chloride: 99 mmol/L (ref 98–111)
Creatinine, Ser: 0.99 mg/dL (ref 0.61–1.24)
GFR, Estimated: >60 mL/min (ref >60)
Glucose, Bld: 96 mg/dL (ref 70–99)
Potassium: 3.4 mmol/L — ABNORMAL LOW (ref 3.5–5.1)
Sodium: 135 mmol/L (ref 135–145)

## 2022-01-01 LAB — TROPONIN I (HIGH SENSITIVITY)
Troponin I (High Sensitivity): 7 ng/L (ref <18)
Troponin I (High Sensitivity): 7 ng/L (ref <18)

## 2022-01-01 LAB — D-DIMER, QUANTITATIVE: D-Dimer, Quant: 0.63 ug/mL-FEU — ABNORMAL HIGH (ref 0.00–0.50)

## 2022-01-01 LAB — RESP PANEL BY RT-PCR (FLU A&B, COVID) ARPGX2
Influenza A by PCR: NEGATIVE
Influenza B by PCR: NEGATIVE
SARS Coronavirus 2 by RT PCR: NEGATIVE

## 2022-01-01 LAB — BRAIN NATRIURETIC PEPTIDE: B Natriuretic Peptide: 46.6 pg/mL (ref 0.0–100.0)

## 2022-01-01 MED ORDER — AZITHROMYCIN 250 MG PO TABS
500.0000 mg | ORAL_TABLET | Freq: Once | ORAL | Status: AC
Start: 2022-01-01 — End: 2022-01-01
  Administered 2022-01-01: 500 mg via ORAL
  Filled 2022-01-01: qty 2

## 2022-01-01 MED ORDER — IOHEXOL 350 MG/ML SOLN
80.0000 mL | Freq: Once | INTRAVENOUS | Status: AC | PRN
  Administered 2022-01-01: 80 mL via INTRAVENOUS

## 2022-01-01 MED ORDER — ASPIRIN 81 MG PO CHEW
324.0000 mg | CHEWABLE_TABLET | Freq: Once | ORAL | Status: AC
  Administered 2022-01-01: 324 mg via ORAL
  Filled 2022-01-01: qty 4

## 2022-01-01 MED ORDER — SODIUM CHLORIDE 0.9 % IV SOLN
1.0000 g | Freq: Once | INTRAVENOUS | Status: AC
  Administered 2022-01-01: 1 g via INTRAVENOUS
  Filled 2022-01-01: qty 10

## 2022-01-01 NOTE — Telephone Encounter (Signed)
Pt c/o Shortness Of Breath: STAT if SOB developed within the last 24 hours or pt is noticeably SOB on the phone  1. Are you currently SOB (can you hear that pt is SOB on the phone)?  No   2. How long have you been experiencing SOB?  About 1 week  3. Are you SOB when sitting or when up moving around?  When up and moving around (mainly when using arms during shower)  4. Are you currently experiencing any other symptoms?  Palpitations, weakness

## 2022-01-01 NOTE — Telephone Encounter (Signed)
Agree. Will see on the 2nd  Abner Ardis Martinique MD, [facility]

## 2022-01-01 NOTE — ED Provider Notes (Signed)
[facility] Provider Note   CSN: [id number] Arrival date & time: 01/01/22  1732     History  Chief Complaint  Patient presents with   Shortness of Breath    Wayne Rodriguez is a 76 y.o. male with a pmh of htn, PVCs, aortic root dilatation, pulmonary nodule, GERD, distant history of smoking.  He presents the emergency department with a chief complaint of shortness of breath.  Patient states that for the past 4 days he has had severe exertional dyspnea with even minimal exertion.  He states that he is having intermittent episodes of pain in the left neck and shoulder when he has episodes of exertional dyspnea and intermittent episodes of palpitations when he has episodes of exertional dyspnea but none of this occurs at rest.  He is also complaining of intermittent paresthesias of the upper extremities and heavy sweating from his palms.  He denies nausea or vomiting.  He denies overt chest pain.  He has no history of CAD.  He has had a persistent cough.  He denies unilateral leg  Shortness of Breath     Home Medications Prior to Admission medications   Medication Sig Start Date End Date Taking? Authorizing Provider  amLODipine (NORVASC) 5 MG tablet Take 1 tablet (5 mg total) by mouth daily. 11/21/20   Melrose Nakayama, MD  aspirin 81 MG tablet Take 81 mg by mouth daily.    [provider]  Cholecalciferol (VITAMIN D-3 PO) Take 1,000 mg by mouth daily.     [provider]  Coenzyme Q10 200 MG capsule Take 200 mg by mouth daily.    [provider]  Cyanocobalamin (VITAMIN B-12 PO) Take 1 tablet by mouth daily.     [provider]  cyclobenzaprine (FLEXERIL) 10 MG tablet Take 1 tablet (10 mg total) by mouth 2 (two) times daily as needed for muscle spasms. 06/20/21   Eustaquio Maize, PA-C  lisinopril-hydrochlorothiazide (ZESTORETIC) 20-12.5 MG tablet Take 2 tablets by mouth daily. 11/18/19   Almyra Deforest, PA  nitroGLYCERIN  (NITROSTAT) 0.4 MG SL tablet Place 1 tablet (0.4 mg total) under the tongue every 5 (five) minutes as needed. 07/28/20   Martinique, Peter M, MD  Probiotic Product (PROBIOTIC DAILY PO) Take 1 capsule by mouth daily. Reported on 01/15/2016    [provider]      Allergies    Beta adrenergic blockers and Codeine    Review of Systems   Review of Systems  Respiratory:  Positive for shortness of breath.    Physical Exam Updated Vital Signs BP (!) 150/59 (BP Location: Right Arm)    Pulse (!) 54    Temp 98.1 F (36.7 C) (Oral)    Resp 20    Ht 5\' 9"  (1.753 m)    Wt 68 kg    SpO2 100%    BMI 22.15 kg/m  Physical Exam Vitals and nursing note reviewed.  Constitutional:      General: He is not in acute distress.    Appearance: He is well-developed. He is not diaphoretic.  HENT:     Head: Normocephalic and atraumatic.  Eyes:     General: No scleral icterus.    Conjunctiva/sclera: Conjunctivae normal.  Cardiovascular:     Rate and Rhythm: Normal rate and regular rhythm.     Heart sounds: Normal heart sounds.  Pulmonary:     Effort: Pulmonary effort is normal. No respiratory distress.     Breath sounds: Normal breath  sounds.  Abdominal:     Palpations: Abdomen is soft.     Tenderness: There is no abdominal tenderness.  Musculoskeletal:     Cervical back: Normal range of motion and neck supple.     Right lower leg: No edema.     Left lower leg: No edema.  Skin:    General: Skin is warm and dry.  Neurological:     Mental Status: He is alert.  Psychiatric:        Behavior: Behavior normal.    ED Results / Procedures / Treatments   Labs (all labs ordered are listed, but only abnormal results are displayed) Labs Reviewed - No data to display  EKG None  Radiology No results found.  Procedures Procedures    Medications Ordered in ED Medications - No data to display  ED Course/ Medical Decision Making/ A&P Clinical Course as of 01/02/22 0028  Tue Jan 01, 2022  1857  DG Chest [place] 1 View [AH]  1857 EKG 12-Lead [AH]  1857 D-dimer, quantitative(!) [AH]  1857 CBC [AH]  2220 CT Angio Chest PE W and/or Wo Contrast [AH]    Clinical Course User Index [AH] Margarita Mail, PA-C                           Medical Decision Making 76 year old male here with complaint of shortness of breath. The emergent differential diagnosis for shortness of breath includes, but is not limited to, Pulmonary edema, bronchoconstriction, Pneumonia, Pulmonary embolism, Pneumotherax/ Hemothorax, Dysrythmia, ACS.  Patient has no shortness of breath at rest but severe shortness of breath with exertion. Obvious concern for anginal equivalent.  Patient CT angiogram I personally reviewed shows no evidence of pulmonary embolus.  The CT scan is concerning for multiple nodules which may be infectious versus inflammatory.  EKG is not concerning for ACS.  He has 2 negative troponins here however he may still be having significant anginal symptoms and needs a work-up.  No evidence of increase in the size of the patient's thoracic aortic aneurysm.  I discussed the case with Dr. Cira Rue he will admit the patient.  Will treat as an infectious process with community-acquired pneumonia antibiotics including azithromycin and Rocephin and I have drawn blood cultures. Patient has no hypoxia or need for support at rest.   Amount and/or Complexity of Data Reviewed Labs: ordered. Decision-making details documented in ED Course. Radiology: ordered and independent interpretation performed. Decision-making details documented in ED Course. ECG/medicine tests: ordered and independent interpretation performed. Decision-making details documented in ED Course.  Risk OTC drugs. Prescription drug management. Decision regarding hospitalization.            Final Clinical Impression(s) / ED Diagnoses Final diagnoses:  None    Rx / DC Orders ED Discharge Orders     None         Margarita Mail, PA-C 01/02/22 0030    Gareth Morgan, MD 01/02/22 [phone]

## 2022-01-01 NOTE — Telephone Encounter (Signed)
Patient reports sob and palpitations with weakness over the past 2-3 days. Last night when he got out of shower and raised his arms to dry off, he felt faint and had to sit. At that time, bp 152/62, p 55, sat 97%. He sounded minimally sob over the phone. BP 136/61, P 65, sat 98%. He reports left shoulder/arm pain that comes and goes. He is scheduled to see Dr. Martinique on 3/2. Recommended to patient that if sob worsens, to go to the ED. Pt voiced understanding of this conversation.

## 2022-01-01 NOTE — ED Triage Notes (Signed)
Pt c/o intermittent SOB, pain to upper back, weakness x 2-3 days-pt states he was advsied by MD office to take NTG prn-pt states he took NTG x 1 ~1hour PTA-NAD-to triage in w/c

## 2022-01-02 ENCOUNTER — Observation Stay (HOSPITAL_BASED_OUTPATIENT_CLINIC_OR_DEPARTMENT_OTHER): Payer: Medicare HMO

## 2022-01-02 DIAGNOSIS — R0609 Other forms of dyspnea: Secondary | ICD-10-CM

## 2022-01-02 DIAGNOSIS — R911 Solitary pulmonary nodule: Secondary | ICD-10-CM | POA: Diagnosis not present

## 2022-01-02 DIAGNOSIS — I10 Essential (primary) hypertension: Secondary | ICD-10-CM

## 2022-01-02 DIAGNOSIS — R001 Bradycardia, unspecified: Secondary | ICD-10-CM

## 2022-01-02 DIAGNOSIS — E876 Hypokalemia: Secondary | ICD-10-CM

## 2022-01-02 DIAGNOSIS — I7121 Aneurysm of the ascending aorta, without rupture: Secondary | ICD-10-CM | POA: Diagnosis not present

## 2022-01-02 LAB — ECHOCARDIOGRAM COMPLETE
AR max vel: 2.18 cm2
AV Area VTI: 2.23 cm2
AV Area mean vel: 2 cm2
AV Mean grad: 7 mmHg
AV Peak grad: 12.5 mmHg
Ao pk vel: 1.77 m/s
Area-P 1/2: 3.27 cm2
Calc EF: 71.7 %
Height: 69 in
MV M vel: 4.5 m/s
MV Peak grad: 81 mmHg
P 1/2 time: 420 msec
S' Lateral: 3.2 cm
Single Plane A2C EF: 65.8 %
Single Plane A4C EF: 77 %
Weight: 2603.2 oz

## 2022-01-02 LAB — BASIC METABOLIC PANEL
Anion gap: 7 (ref 5–15)
BUN: 16 mg/dL (ref 8–23)
CO2: 29 mmol/L (ref 22–32)
Calcium: 9 mg/dL (ref 8.9–10.3)
Chloride: 101 mmol/L (ref 98–111)
Creatinine, Ser: 0.93 mg/dL (ref 0.61–1.24)
GFR, Estimated: >60 mL/min (ref >60)
Glucose, Bld: 93 mg/dL (ref 70–99)
Potassium: 3.8 mmol/L (ref 3.5–5.1)
Sodium: 137 mmol/L (ref 135–145)

## 2022-01-02 LAB — PROCALCITONIN: Procalcitonin: <0.1 ng/mL

## 2022-01-02 LAB — TSH: TSH: 1.144 u[IU]/mL (ref 0.350–4.500)

## 2022-01-02 MED ORDER — ACETAMINOPHEN 325 MG PO TABS
650.0000 mg | ORAL_TABLET | Freq: Four times a day (QID) | ORAL | Status: DC | PRN
  Administered 2022-01-02 (×2): 650 mg via ORAL
  Filled 2022-01-02 (×2): qty 2

## 2022-01-02 MED ORDER — ATORVASTATIN CALCIUM 40 MG PO TABS
40.0000 mg | ORAL_TABLET | Freq: Every day | ORAL | Status: DC
Start: 2022-01-02 — End: 2022-01-03
  Administered 2022-01-02 – 2022-01-03 (×2): 40 mg via ORAL
  Filled 2022-01-02 (×2): qty 1

## 2022-01-02 MED ORDER — ENOXAPARIN SODIUM 40 MG/0.4ML IJ SOSY
40.0000 mg | PREFILLED_SYRINGE | INTRAMUSCULAR | Status: DC
  Administered 2022-01-02 – 2022-01-03 (×2): 40 mg via SUBCUTANEOUS
  Filled 2022-01-02 (×2): qty 0.4

## 2022-01-02 MED ORDER — LISINOPRIL 20 MG PO TABS
20.0000 mg | ORAL_TABLET | Freq: Every day | ORAL | Status: DC
  Administered 2022-01-02 – 2022-01-03 (×2): 20 mg via ORAL
  Filled 2022-01-02 (×2): qty 1

## 2022-01-02 MED ORDER — HYDRALAZINE HCL 20 MG/ML IJ SOLN
5.0000 mg | Freq: Four times a day (QID) | INTRAMUSCULAR | Status: DC | PRN
  Administered 2022-01-02: 5 mg via INTRAVENOUS
  Filled 2022-01-02: qty 1

## 2022-01-02 MED ORDER — HYDROCHLOROTHIAZIDE 12.5 MG PO TABS
12.5000 mg | ORAL_TABLET | Freq: Every day | ORAL | Status: DC
Start: 2022-01-02 — End: 2022-01-03
  Administered 2022-01-02 – 2022-01-03 (×2): 12.5 mg via ORAL
  Filled 2022-01-02 (×2): qty 1

## 2022-01-02 MED ORDER — ASPIRIN EC 81 MG PO TBEC
81.0000 mg | DELAYED_RELEASE_TABLET | Freq: Every day | ORAL | Status: DC
  Administered 2022-01-02 – 2022-01-03 (×2): 81 mg via ORAL
  Filled 2022-01-02 (×2): qty 1

## 2022-01-02 MED ORDER — AMLODIPINE BESYLATE 5 MG PO TABS
5.0000 mg | ORAL_TABLET | Freq: Every day | ORAL | Status: DC
  Administered 2022-01-02 – 2022-01-03 (×2): 5 mg via ORAL
  Filled 2022-01-02 (×2): qty 1

## 2022-01-02 MED ORDER — POTASSIUM CHLORIDE CRYS ER 20 MEQ PO TBCR
40.0000 meq | EXTENDED_RELEASE_TABLET | Freq: Once | ORAL | Status: AC
  Administered 2022-01-02: 40 meq via ORAL
  Filled 2022-01-02: qty 2

## 2022-01-02 MED ORDER — MUSCLE RUB 10-15 % EX CREA
TOPICAL_CREAM | CUTANEOUS | Status: DC | PRN
  Filled 2022-01-02: qty 85

## 2022-01-02 MED ORDER — LISINOPRIL-HYDROCHLOROTHIAZIDE 20-12.5 MG PO TABS: 2.0000 | ORAL_TABLET | Freq: Every day | ORAL | Status: DC

## 2022-01-02 NOTE — Progress Notes (Signed)
? ?  Echocardiogram ?2D Echocardiogram has been performed. ? ?Wayne Rodriguez ?01/02/2022, 10:33 AM ?

## 2022-01-02 NOTE — Progress Notes (Signed)
Plan of Care Note for accepted transfer ? ? ?Patient: Wayne Rodriguez MRN: [id number]   DOA: 01/01/2022 ? ?Facility requesting transfer: [facility] ED ?Requesting Provider: PA Margarita Mail ?Reason for transfer: shortness of breath ?Facility course:  ? ?76 year old male with past medical history of mitral valve prolapse, hypertension, gastroesophageal reflux disease, who presents to [facility] emergency department with complaints of worsening dyspnea on exertion and weakness. ? ?ER provider also describes episodic left neck and shoulder pain that intermittently seems to occur with exertion that the ER provider feels could be an anginal equivalent. ? ?In the emergency department initial 2 sets of troponins were found to be unremarkable.  CT imaging of the chest revealed interval development of a cluster of nodules in the lingula as well as similar-appearing cluster of nodules in the right medial anterior upper lobe that are new compared to previous CT imaging of the chest in January.  Radiology mentions infection versus malignancy however ER providers concern for possible infectious process considering patient's new onset of shortness of breath.  Patient has been administered intravenous ceftriaxone and azithromycin. ? ?ER providers requesting hospitalization for treatment of suspected pneumonia as well as work-up of what they feel is an anginal equivalent. ? ?Plan of care: ?The patient is accepted for admission to Telemetry unit, at [facility]..  ? ? ?Author: ?Vernelle Emerald, MD ?01/02/2022 ? ?Check [email or web address] for on-call coverage. ? ?Nursing staff, Please call [place] number on Amion as soon as patient's arrival, so appropriate admitting provider can evaluate the pt. ?

## 2022-01-02 NOTE — Progress Notes (Signed)
SATURATION QUALIFICATIONS: (This note is used to comply with regulatory documentation for home oxygen) ? ?Patient Saturations on Room Air at Rest = 100% ? ?Patient Saturations on Room Air while Ambulating = 90-92% ? ? ?Please briefly explain why patient needs home oxygen: ?Patient ambulated a distance of 375ft. O2 sats remained at 90% and greater. Pt did not feel short of breath at the time. And did not feel any palpitations.  ?
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
## 2022-01-02 NOTE — Progress Notes (Unsigned)
Cardiology Office Note:    Date:  01/02/2022   ID:  Wayne Rodriguez, DOB 12/21/1945, MRN [id number]  PCP:  Orpah Melter, MD  Cardiologist:  Tlaloc Taddei Martinique, MD  Electrophysiologist:  None   Referring MD: Orpah Melter, MD   No chief complaint on file.   History of Present Illness:    Wayne Rodriguez is a 76 y.o. male seen for evaluation of SOB and palpitations. He has a hx of PVCs, aortic root dilatation, hypertension, coronary artery calcification, mitral valve prolapse and history of lung nodule.  Previous cardiac catheterization on 02/26/2010 demonstrated right dominant system with mild irregularities however no significant stenosis.  Last echocardiogram obtained on 04/12/2019 showed EF 60 to 65%, mild basal septal hypertrophy, mild posterior leaflet mitral valve prolapse, myxomatous mitral valve with mild thickening of leaflet, mild to moderate MR, mild dilatation of the ascending aorta measuring at 41 mm.  CT of the chest obtained on 06/01/2019 showed minimal increase in the ascending thoracic aortic aneurysm measuring at 4.5 cm, new 4 mm right upper lobe pulmonary nodule, aortic atherosclerosis and two-vessel coronary artery calcification.  He was seen by Dr. Roxan Hockey in July 2020, Norvasc 5 mg daily was added to help with blood pressure control. Follow up CT in January 2021 was unchanged.   He was seen in the ED on 07/26/20 with atypical chest pain. Seen by cardiology coverage. Ecg was normal. Troponin was normal x 2. D dimer normal. No symptoms since then  On follow up today he is doing very well. No chest pain, SOB, palpitations, dizziness. Is very active.    Past Medical History:  Diagnosis Date   Aortic root dilatation ([place])    a. 09/2010 CT: 4.1cm Max Asc Ao diam, 3.7cm Max Desc Ao diam;; s/p MRI/MRA in April 2013   Back pain    Cancer [facility])    skin cancer removed from face   Chest pain    a. 02/2010 Cath: normal   Decreased exercise tolerance    a. 02/2012   GERD  (gastroesophageal reflux disease)    HTN (hypertension)    Mitral valve prolapse    Palpitations    a. h/o palps r/t caffeine - has occurred again 02/2012   Pulmonary nodule    3 nodules removed over 3 years by Dr Roxan Hockey   PVC's (premature ventricular contractions) 07/17/2015   Vertigo    hx of    Past Surgical History:  Procedure Laterality Date   [place]     no PCI   COLONOSCOPY     COLONOSCOPY W/ [place]   remove foreign body; right/right eye is blurry   VIDEO ASSISTED THORACOSCOPY (VATS)/WEDGE RESECTION Right 03/17/2014   Procedure: VIDEO ASSISTED THORACOSCOPY (VATS)/WEDGE RESECTION;  Surgeon: Melrose Nakayama, MD;  Location: [place];  Service: Thoracic;  Laterality: Right;    Current Medications: No outpatient medications have been marked as taking for the 01/03/22 encounter (Appointment) with Martinique, Reizy Dunlow M, MD.     Allergies:   Beta adrenergic blockers and Codeine   Social History   Socioeconomic History   Marital status: Married    Spouse name: Not on file   Number of children: Not on file   Years of education: Not on file   Highest education level: Not on file  Occupational History   Not on file  Tobacco Use   Smoking status: Former    Packs/day: 1.00  Years: 30.00    Pack years: 30.00    Types: Cigarettes    Start date: 03/08/1962    Quit date: 03/08/1992    Years since quitting: 29.8   Smokeless tobacco: Former    Types: Snuff, Sarina Ser    Quit date: 01/06/2013  Vaping Use   Vaping Use: Never used  Substance and Sexual Activity   Alcohol use: No   Drug use: No   Sexual activity: Not Currently  Other Topics Concern   Not on file  Social History Narrative   Not on file   Social Determinants of Health   Financial Resource Strain: Not on file  Food Insecurity: Not on file  Transportation Needs: Not on file  Physical Activity: Not on file  Stress: Not on file  Social Connections:  Not on file     Family History: The patient's family history includes Colon cancer (age of onset: 76) in his sister; Lung cancer in his mother. There is no history of Colon polyps, Esophageal cancer, Rectal cancer, or Stomach cancer.  ROS:   Please see the history of present illness.     All other systems reviewed and are negative.  EKGs/Labs/Other Studies Reviewed:    The following studies were reviewed today:  Echo 04/12/2019 IMPRESSIONS      1. The left ventricle has normal systolic function with an ejection fraction of 60-65%. The cavity size was normal. Mild basal septal hypertrophy. Left ventricular diastolic parameters were normal. No evidence of left ventricular regional wall motion  abnormalities.  2. The right ventricle has normal systolic function. The cavity was normal. There is no increase in right ventricular wall thickness.  3. Mild posterior leaflet mitral valve prolapse.  4. The mitral valve is myxomatous. Mild thickening of the mitral valve leaflet. Mitral valve regurgitation is mild to moderate by color flow Doppler. The MR jet is eccentric anteriorly directed.  5. The aortic valve is tricuspid. Mild sclerosis of the aortic valve. Aortic valve regurgitation is mild to moderate by color flow Doppler.  6. There is mild dilatation of the ascending aorta measuring 41 mm.   CT of chest 06/01/2019 IMPRESSION: 1. Minimal increase in size of ascending thoracic aortic aneurysm which currently measures 4.5 cm in diameter (previously 4.4 cm). Ascending thoracic aortic aneurysm. Recommend semi-annual imaging followup by CTA or MRA and referral to cardiothoracic surgery if not already obtained. This recommendation follows 2010 ACCF/AHA/AATS/ACR/ASA/SCA/SCAI/SIR/STS/SVM Guidelines for the Diagnosis and Management of Patients With Thoracic Aortic Disease. Circulation. 2010; 121: G500-B704. Aortic aneurysm NOS (ICD10-I71.9). 2. Previously noted left upper lobe pulmonary nodule  is stable dating back to 2017, considered benign. However, there is a new 4 mm right upper lobe pulmonary nodule (axial image 77 of series 8). This is nonspecific, but attention at time of follow-up examination is recommended to ensure stability of this finding. 3. Mild diffuse bronchial wall thickening with mild centrilobular and paraseptal emphysema; imaging findings suggestive of underlying COPD. 4. Aortic atherosclerosis, in addition to left main and 2 vessel coronary artery disease. Please note that although the presence of coronary artery calcium documents the presence of coronary artery disease, the severity of this disease and any potential stenosis cannot be assessed on this non-gated CT examination. Assessment for potential risk factor modification, dietary therapy or pharmacologic therapy may be warranted, if clinically indicated.    EKG:  EKG is not ordered today.   Recent Labs: 01/01/2022: B Natriuretic Peptide 46.6; Hemoglobin 14.4; Platelets 160; TSH 1.144 01/02/2022: BUN 16; Creatinine,  Ser 0.93; Potassium 3.8; Sodium 137  Recent Lipid Panel    Component Value Date/Time   CHOL 127 09/29/2017 1009   TRIG 78 09/29/2017 1009   HDL 59 09/29/2017 1009   CHOLHDL 3 10/19/2012 0844   VLDL 22.8 10/19/2012 0844   LDLCALC 52 09/29/2017 1009   Dated 12/15/19: cholesterol 135, triglycerides 77, HDL 53, LDL 67. ALT normal. Dated 12/14/20: cholesterol 120, triglycerides 74, HDL 57, LDL 48. CMET normal.   Ecg today shows NSR rate 54. First degree AV block. Otherwise normal.   CT ANGIOGRAPHY CHEST WITH CONTRAST   TECHNIQUE: Multidetector CT imaging of the chest was performed using the standard protocol during bolus administration of intravenous contrast. Multiplanar CT image reconstructions and MIPs were obtained to evaluate the vascular anatomy.   CONTRAST:  48mL ISOVUE-370 IOPAMIDOL (ISOVUE-370) INJECTION 76%   COMPARISON:  11/18/2019, 06/01/2019, 01/27/2018, 01/28/2017,  most remote 09/24/2010   FINDINGS: Cardiovascular:   Heart:   No cardiomegaly. No pericardial fluid/thickening. No significant coronary calcifications.   Aorta:   No significant aortic valve calcifications.   Maximum diameter of the ascending aorta estimated 41 mm. Greatest diameter estimated on the most remote CT 09/24/2010 was 41 mm.   Mild atherosclerotic changes of the aortic arch. Branch vessels are patent. Three vessel arch. Mild atherosclerosis of the left subclavian artery without high-grade stenosis. The cervical cerebral vessels are patent. Dominant right vertebral artery.   No dissection or ulcerated plaque. Diameter of the thoracic aorta at the hiatus measures 28 mm.   Pulmonary arteries:   Timing of the contrast bolus is not optimized for evaluation of pulmonary artery filling defects.   Mediastinum/Nodes: No mediastinal adenopathy. Unremarkable appearance of the thoracic esophagus.   Unremarkable thoracic inlet.   Lungs/Pleura: Scarring/surgical changes at the apex of the right upper lobe, similar to the comparison. No new or enlarging nodularity or soft tissue. No pneumothorax or pleural effusion.   No confluent airspace disease.   Left upper lobe nodule with linear configuration, measuring longest diameter 8.8 mm, short axis 5.4 mm. Essentially this is unchanged dating to the CT of 04/30/2016 when it was identified. At that time the diameters estimated 6 mm x 10 mm.   No additional nodules.   Upper Abdomen: Calcifications within spleen parenchyma. No acute finding of the upper abdomen.   Musculoskeletal: Degenerative changes of the spine with disc space narrowing. Syndesmophyte formation along the ventral aspect of the thoracic spine. No bony canal narrowing. No displaced fracture.   Review of the MIP images confirms the above findings.   IMPRESSION: Diameter of the ascending aorta is essentially unchanged when compared to the most remote CT  of 09/24/2010, with the greatest estimated diameter on the current CT approximately 41 mm.   The left upper lobe nodule is favored to be benign, given the relatively unchanged appearance/size over nearly 5 years dating to the most remote comparison CT of 04/25/2015.   Redemonstration of postsurgical changes of right upper lobe with associated scarring.   Aortic Atherosclerosis (ICD10-I70.0).   Signed,   Dulcy Fanny. Earleen Newport, DO, RPVI  Physical Exam:    VS:  There were no vitals taken for this visit.    Wt Readings from Last 3 Encounters:  01/02/22 162 lb 11.2 oz (73.8 kg)  11/27/21 148 lb (67.1 kg)  10/26/21 149 lb (67.6 kg)     GEN:  Well nourished, well developed in no acute distress HEENT: Normal NECK: No JVD; No carotid bruits LYMPHATICS: No lymphadenopathy CARDIAC: RRR,  no murmurs, rubs, gallops RESPIRATORY:  Clear to auscultation without rales, wheezing or rhonchi  ABDOMEN: Soft, non-tender, non-distended MUSCULOSKELETAL:  No edema; No deformity  SKIN: Warm and dry NEUROLOGIC:  Alert and oriented x 3 PSYCHIATRIC:  Normal affect   ASSESSMENT:    No diagnosis found.  PLAN:    In order of problems listed above:  1.   Coronary artery calcification: Seen on last CT of chest in July 2020.  Denies any  chest discomfort. Focus on risk factor modification  3.  Thoracic aortic aneurysm: Followed by Dr. Roxan Hockey. Last CT in Jan. Showed stable dimension of 4.1 cm.  4.  History of PVCs: No significant palpitation recently  5. Mitral valve prolapse: mild   6. Lung nodule:   He is getting regular CT for thoracic aortic aneurysm as well.  7. HTN controlled.    Medication Adjustments/Labs and Tests Ordered: Current medicines are reviewed at length with the patient today.  Concerns regarding medicines are outlined above.  No orders of the defined types were placed in this encounter.  No orders of the defined types were placed in this encounter.   There are no  Patient Instructions on file for this visit.  Follow up in one year  Signed, Buck Mcaffee Martinique, MD  01/02/2022 7:25 AM    [facility]

## 2022-01-02 NOTE — Progress Notes (Signed)
Subjective: ?Patient admitted this morning, see detailed H&P by Dr Flossie Buffy ?76 year old male with medical history of PVCs, aortic root dilatation, hypertension, coronary artery calcification, mitral valve prolapse and history of lung nodule presented with increasing shortness of breath.  In the ED he was found to bradycardic with heart rate in 40s and 50s, intermittently hypotensive.  D-dimer is elevated, CT chest showed no PE but showed interval development of new cluster of nodules to the right medial anterior upper lobe.  Infectious/inflammatory versus malignancy not excluded.  Descending aortic root aneurysm, stable at 4.2 cm.  Patient was started on IV Rocephin and Zithromax for presumed pneumonia and transferred to [facility]. ? ?Vitals:  ? 01/02/22 1056 01/02/22 1220  ?BP: (!) 126/58 (!) 127/55  ?Pulse: (!) 50 (!) 57  ?Resp: 16 14  ?Temp: 97.8 ?F (36.6 ?C) 98.1 ?F (36.7 ?C)  ?SpO2: 99% 100%  ? ? ? ? ?A/P ? ?Dyspnea on exertion ?-Unclear etiology ?-CT chest negative for PE ?-Echocardiogram obtained is unremarkable ?-We will check pulse ox on ambulation ?-Obtain PT evaluation ? ?Hypertension ?-Blood pressure has been elevated ?-Continue home medication including Norvasc, lisinopril, HCTZ ? ?Bradycardia ?-Heart rate chronically low in 40s and 50s ? ?Ascending aortic aneurysm ?-Stable at 4.2 cm ?-Patient follows CT surgeon Dr. Roxan Hockey ? ? ?Oswald Hillock ?[facility] ?Pager- [phone]  ?

## 2022-01-03 ENCOUNTER — Observation Stay (INDEPENDENT_AMBULATORY_CARE_PROVIDER_SITE_OTHER): Payer: Medicare HMO

## 2022-01-03 ENCOUNTER — Telehealth: Payer: Self-pay

## 2022-01-03 ENCOUNTER — Other Ambulatory Visit: Payer: Self-pay | Admitting: Physician Assistant

## 2022-01-03 ENCOUNTER — Encounter: Payer: Self-pay | Admitting: *Deleted

## 2022-01-03 ENCOUNTER — Ambulatory Visit: Payer: Medicare HMO | Admitting: Cardiology

## 2022-01-03 ENCOUNTER — Telehealth: Payer: Self-pay | Admitting: Pulmonary Disease

## 2022-01-03 DIAGNOSIS — I493 Ventricular premature depolarization: Secondary | ICD-10-CM

## 2022-01-03 DIAGNOSIS — R0609 Other forms of dyspnea: Secondary | ICD-10-CM | POA: Diagnosis not present

## 2022-01-03 DIAGNOSIS — R002 Palpitations: Secondary | ICD-10-CM | POA: Diagnosis not present

## 2022-01-03 DIAGNOSIS — I7121 Aneurysm of the ascending aorta, without rupture: Secondary | ICD-10-CM | POA: Diagnosis not present

## 2022-01-03 DIAGNOSIS — R911 Solitary pulmonary nodule: Secondary | ICD-10-CM | POA: Diagnosis not present

## 2022-01-03 MED ORDER — AMOXICILLIN-POT CLAVULANATE 875-125 MG PO TABS: 1.0000 | ORAL_TABLET | Freq: Two times a day (BID) | ORAL | 0 refills | Status: AC

## 2022-01-03 NOTE — Telephone Encounter (Signed)
Please arrange new consult appointment, next available with me or another provider for lung nodules noted during CT scan done at the hospital ?-History of prior wedge resection with Dr. Roxan Hockey for benign nodule ?

## 2022-01-03 NOTE — Consult Note (Addendum)
Cardiology Consultation:   Patient ID: Wayne Rodriguez MRN: [id number]; DOB: February 21, 1946  Admit date: 01/01/2022 Date of Consult: 01/03/2022  PCP:  Orpah Melter, MD   [facility] Providers Cardiologist:  Peter Martinique, MD   Patient Profile:   Wayne Rodriguez is a 76 y.o. male with a hx of atypical chest pain, palpitations, PVCs, aortic root dilation, HTN, coronary artery calcifications, MCP, and lung nodule who is being seen 01/03/2022 for the evaluation of DOE at the request of Dr. Darrick Meigs.  History of Present Illness:   Wayne Rodriguez currently follows with Dr. Martinique. Heart catheterization 02/26/10 showed mild irregularities but no significant stenosis. Echo 04/12/19 with preserved EF, mild basal septal hypertrophy, mild posterior leaflet mitral valve prolapse, myxomatous MV, mild to moderate MR, and ascending aorta measuring 41 mm. CT chest  06/01/19 with minimal increase in ascending thoracic aortic aneurysm at 45 mm, new 4 mm upper lobe nodule, aortic atherosclerosis and 2 vessel CAD, He was referred to CT surgery in 05/2019. Amlodipine added to BP regimen. CT Jan 2021 unchanged.  He was seen in the ER 07/26/20 for atypical chest pain but ruled out with negative CE and nonischemic EKG. He was last seen by Dr. Martinique 01/26/21 and was doing well at that time. He is a former smoker. He was seen by CT surgery last month with repeat CT chest showing stable aneurysm.   He presented to [place] 01/01/22 with dyspnea on exertion. 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 Over the last week, he has experienced DOE requiring him to rest with resolution of dyspnea after 30 min of rest.   HST x 2 negative BNP 46 D-dimer 0.63 CTA negative for PE TSH WNL  CTA did show new cluster of nodules within the lingula. He was referred to OP pulmonology. Infectious/inflammatory vs malignancy could not be ruled out. He was started on IV ABX and transferred to [facility].  Pt ambulated in the hall and maintained O2  sats above 90% without palpitations. Primary discussed discharge planning, but patient and family expressed desire for cardiology consult for DOE.   During my interview, the patient recounts his PCP visit on 12/19/21 in which he received the flu vaccine and was started on 40 mg lipitor once per week - which he scheduled to take on Sunday. Several days after his visit, he started experiencing frequent palpitations and dyspnea with minimal activity. This was concerning to him because he is very active at baseline. While taking a shower, he noted worsening shortness of breath. He checked his O2 sat and found a pulse of 114 prompting his ER visit. He describes a very similar sequence of event and symptoms after receiving his second COVID vaccination the fall of 2021 which also resulted in a brief hospitalization. He is no longer having palpitations today. No chest pain or lower extremity swelling. He does have chronic neck pain from prior injury in which tree limbs fell on him.     Past Medical History:  Diagnosis Date   Aortic root dilatation ([place])    a. 09/2010 CT: 4.1cm Max Asc Ao diam, 3.7cm Max Desc Ao diam;; s/p MRI/MRA in April 2013   Back pain    Cancer [facility])    skin cancer removed from face   Chest pain    a. 02/2010 Cath: normal   Decreased exercise tolerance    a. 02/2012   GERD (gastroesophageal reflux disease)    HTN (hypertension)    Mitral valve  prolapse    Palpitations    a. h/o palps r/t caffeine - has occurred again 02/2012   Pulmonary nodule    3 nodules removed over 3 years by Dr Roxan Hockey   PVC's (premature ventricular contractions) 07/17/2015   Vertigo    hx of    Past Surgical History:  Procedure Laterality Date   Advance     no PCI   COLONOSCOPY     COLONOSCOPY W/ [place]   remove foreign body; right/right eye is blurry   VIDEO ASSISTED THORACOSCOPY (VATS)/WEDGE RESECTION Right 03/17/2014    Procedure: VIDEO ASSISTED THORACOSCOPY (VATS)/WEDGE RESECTION;  Surgeon: Melrose Nakayama, MD;  Location: [place];  Service: Thoracic;  Laterality: Right;     Home Medications:  Prior to Admission medications   Medication Sig Start Date End Date Taking? Authorizing Provider  acetaminophen (TYLENOL) 500 MG tablet Take 1,000 mg by mouth every 6 (six) hours as needed for mild pain.   Yes [provider]  amLODipine (NORVASC) 5 MG tablet Take 1 tablet (5 mg total) by mouth daily. 11/21/20  Yes Melrose Nakayama, MD  aspirin 81 MG tablet Take 81 mg by mouth daily.   Yes [provider]  atorvastatin (LIPITOR) 40 MG tablet Take 40 mg by mouth once a week. Sunday 12/19/21  Yes [provider]  carboxymethylcellulose (REFRESH PLUS) 0.5 % SOLN Place 1 drop into both eyes daily as needed (dry eyes).   Yes [provider]  Coenzyme Q10 200 MG capsule Take 200 mg by mouth daily.   Yes [provider]  ibuprofen (ADVIL) 200 MG tablet Take 400 mg by mouth every 8 (eight) hours as needed for mild pain.   Yes [provider]  lisinopril-hydrochlorothiazide (ZESTORETIC) 20-12.5 MG tablet Take 2 tablets by mouth daily. 11/18/19  Yes Almyra Deforest, PA  nitroGLYCERIN (NITROSTAT) 0.4 MG SL tablet Place 1 tablet (0.4 mg total) under the tongue every 5 (five) minutes as needed. Patient taking differently: Place 0.4 mg under the tongue every 5 (five) minutes as needed for chest pain. 07/28/20  Yes Rodriguez, Peter M, MD  Probiotic Product (PROBIOTIC DAILY PO) Take 1 capsule by mouth daily. Reported on 01/15/2016   Yes [provider]    Inpatient Medications: Scheduled Meds:  amLODipine  5 mg Oral Daily   aspirin EC  81 mg Oral Daily   atorvastatin  40 mg Oral Daily   enoxaparin (LOVENOX) injection  40 mg Subcutaneous Q24H   lisinopril  20 mg Oral Daily   And   hydrochlorothiazide  12.5 mg Oral Daily   Continuous Infusions:  PRN Meds: acetaminophen,  hydrALAZINE, Muscle Rub  Allergies:    Allergies  Allergen Reactions   Beta Adrenergic Blockers     Bradycardia  Other reaction(s): bradycardia   Codeine     REACTION: nausea/vomiting    Social History:   Social History   Socioeconomic History   Marital status: Married    Spouse name: Not on file   Number of children: Not on file   Years of education: Not on file   Highest education level: Not on file  Occupational History   Not on file  Tobacco Use   Smoking status: Former    Packs/day: 1.00    Years: 30.00    Pack years: 30.00    Types: Cigarettes    Start date: 03/08/1962    Quit date:  03/08/1992    Years since quitting: 29.8   Smokeless tobacco: Former    Types: Snuff, Sarina Ser    Quit date: 01/06/2013  Vaping Use   Vaping Use: Never used  Substance and Sexual Activity   Alcohol use: No   Drug use: No   Sexual activity: Not Currently  Other Topics Concern   Not on file  Social History Narrative   Not on file   Social Determinants of Health   Financial Resource Strain: Not on file  Food Insecurity: Not on file  Transportation Needs: Not on file  Physical Activity: Not on file  Stress: Not on file  Social Connections: Not on file  Intimate Partner Violence: Not on file    Family History:    Family History  Problem Relation Age of Onset   Lung cancer Mother    Colon cancer Sister 68   Colon polyps Neg Hx    Esophageal cancer Neg Hx    Rectal cancer Neg Hx    Stomach cancer Neg Hx      ROS:  Please see the history of present illness.   All other ROS reviewed and negative.     Physical Exam/Data:   Vitals:   01/02/22 2136 01/03/22 0406 01/03/22 1026 01/03/22 1031  BP: 133/65 131/63 133/60 133/60  Pulse: (!) 58 72    Resp: 16     Temp: 98 F (36.7 C) 97.7 F (36.5 C)    TempSrc: Oral Oral    SpO2: 100% 100%    Weight:      Height:        Intake/Output Summary (Last 24 hours) at 01/03/2022 1155 Last data filed at 01/02/2022 1800 Gross per 24  hour  Intake 360 ml  Output --  Net 360 ml   Last 3 Weights 01/02/2022 01/01/2022 11/27/2021  Weight (lbs) 162 lb 11.2 oz 150 lb 148 lb  Weight (kg) 73.8 kg 68.04 kg 67.132 kg     Body mass index is 24.03 kg/m.  General:  Well nourished, well developed, in no acute distress HEENT: normal Neck: no JVD Vascular: No carotid bruits; Distal pulses 2+ bilaterally Cardiac:  normal S1, S2; RRR; no murmur  Lungs:  clear to auscultation bilaterally, no wheezing, rhonchi or rales  Abd: soft, nontender, no hepatomegaly  Ext: no edema Musculoskeletal:  No deformities, BUE and BLE strength normal and equal Skin: warm and dry  Neuro:  CNs 2-12 intact, no focal abnormalities noted Psych:  Normal affect   EKG:  The EKG was personally reviewed and demonstrates:  sinus bradycardia HR 53, first degree heart block Telemetry:  Telemetry was personally reviewed and demonstrates:  sinus bradycardia - sinus rhythm 50-80s  Relevant CV Studies:  Echo 01/02/22:  1. Left ventricular ejection fraction, by estimation, is 65 to 70%. The  left ventricle has normal function. The left ventricle has no regional  wall motion abnormalities. Left ventricular diastolic parameters were  normal.   2. Right ventricular systolic function is normal. The right ventricular  size is normal. There is normal pulmonary artery systolic pressure.   3. Right atrial size was mildly dilated.   4. The mitral valve is normal in structure. Trivial mitral valve  regurgitation. No evidence of mitral stenosis.   5. The aortic valve is normal in structure. Aortic valve regurgitation is  mild. No aortic stenosis is present.   Laboratory Data:  High Sensitivity Troponin:   Recent Labs  Lab 01/01/22 1807 01/01/22 2032  TROPONINIHS 7  7     Chemistry Recent Labs  Lab 01/01/22 1807 01/02/22 0443  NA 135 137  K 3.4* 3.8  CL 99 101  CO2 29 29  GLUCOSE 96 93  BUN 18 16  CREATININE 0.99 0.93  CALCIUM 9.0 9.0  GFRNONAA >60 >60   ANIONGAP 7 7    No results for input(s): PROT, ALBUMIN, AST, ALT, ALKPHOS, BILITOT in the last 168 hours. Lipids No results for input(s): CHOL, TRIG, HDL, LABVLDL, LDLCALC, CHOLHDL in the last 168 hours.  Hematology Recent Labs  Lab 01/01/22 1807  WBC 6.4  RBC 5.02  HGB 14.4  HCT 42.4  MCV 84.5  MCH 28.7  MCHC 34.0  RDW 13.2  PLT 160   Thyroid  Recent Labs  Lab 01/01/22 2045  TSH 1.144    BNP Recent Labs  Lab 01/01/22 1807  BNP 46.6    DDimer  Recent Labs  Lab 01/01/22 1807  DDIMER 0.63*     Radiology/Studies:  CT Angio Chest PE W and/or Wo Contrast  Result Date: 01/01/2022 CLINICAL DATA:  Pulmonary embolism (PE) suspected, positive D-dimer. intermittent SOB, pain to upper back, weakness x 2-3 days 80 cc Omnipaque 350 EXAM: CT ANGIOGRAPHY CHEST WITH CONTRAST TECHNIQUE: Multidetector CT imaging of the chest was performed using the standard protocol during bolus administration of intravenous contrast. Multiplanar CT image reconstructions and MIPs were obtained to evaluate the vascular anatomy. RADIATION DOSE REDUCTION: This exam was performed according to the departmental dose-optimization program which includes automated exposure control, adjustment of the mA and/or kV according to patient size and/or use of iterative reconstruction technique. CONTRAST:  97mL OMNIPAQUE IOHEXOL 350 MG/ML SOLN COMPARISON:  CT chest 11/27/2021, CT angiography chest 11/14/2020, PET CT 07/30/2016, CT angiography 11/18/2019 FINDINGS: Cardiovascular: Satisfactory opacification of the pulmonary arteries to the segmental level. No evidence of pulmonary embolism. Normal heart size. No significant pericardial effusion. Stable ascending thoracic aorta aneurysm measuring up to 4.2 cm. The descending thoracic aorta is normal in caliber. Mild atherosclerotic plaque of the thoracic aorta. At least 2 vessel coronary artery calcifications. Mediastinum/Nodes: No enlarged mediastinal, hilar, or axillary lymph  nodes. Thyroid gland, trachea, and esophagus demonstrate no significant findings. Lungs/Pleura: Right apex surgical changes/scarring again noted that appear grossly stable. Interval development of a cluster of nodules measuring up to 6 mm (6:87) within the lingula with query associated atelectasis (6:90). Similar-appearing cluster of nodules (measuring up to 5 mm) within the right medial anterior upper lobe (7: 153-190). These nodules were new on CT angiography chest 11/27/2021. Grossly stable 8 x 4 mm left upper lobe pulmonary nodule. No pulmonary mass. No pleural effusion. No pneumothorax. Upper Abdomen: Scattered calcifications throughout the spleen likely sequelae of prior granulomatous disease. No acute abnormality. Musculoskeletal: No abdominal wall hernia or abnormality. No suspicious lytic or blastic osseous lesions. No acute displaced fracture. Multilevel degenerative changes of the spine. Review of the MIP images confirms the above findings. IMPRESSION: 1. No pulmonary embolus. 2. Interval development of a cluster of nodules within the lingula with query associated atelectasis. Grossly stable 8 x 4 mm left upper lobe pulmonary nodule as well as similar-appearing cluster of nodules within the right medial anterior upper lobe - these nodules were new on CT angiography chest 11/27/2021. Query infection/inflammation versus malignancy not excluded. Additional imaging evaluation or consultation with Pulmonology or Thoracic Surgery recommended. 3. Stable ascending thoracic aorta aneurysm (4.2 cm). No follow-up needed if patient is low-risk (and has no known or suspected primary neoplasm). Non-contrast chest CT  can be considered in 12 months if patient is high-risk. This recommendation follows the consensus statement: Guidelines for Management of Incidental Pulmonary Nodules Detected on CT Images: From the Fleischner Society 2017; Radiology 2017; 284:228-243. 4. Aortic Atherosclerosis (ICD10-I70.0). Aortic  aneurysm NOS (ICD10-I71.9). Electronically Signed   By: Iven Finn M.D.   On: 01/01/2022 22:18   DG Chest Port 1 View  Result Date: 01/01/2022 CLINICAL DATA:  Shortness of breath. EXAM: PORTABLE CHEST 1 VIEW COMPARISON:  07/26/2020 FINDINGS: The cardiac silhouette, mediastinal and hilar contours are normal and stable. The lungs are clear. No pulmonary lesions or pleural effusions. The bony thorax is intact. IMPRESSION: No acute cardiopulmonary findings. Electronically Signed   By: Marijo Sanes M.D.   On: 01/01/2022 18:38   ECHOCARDIOGRAM COMPLETE  Result Date: 01/02/2022    ECHOCARDIOGRAM REPORT   Patient Name:   Wayne Rodriguez Date of Exam: 01/02/2022 Medical Rec #:  [id number]        Height:       69.0 in Accession #:    [id number]       Weight:       162.7 lb Date of Birth:  May 27, 1946        BSA:          1.892 m Patient Age:    76 years         BP:           135/64 mmHg Patient Gender: M                HR:           64 bpm. Exam Location:  Inpatient Procedure: 2D Echo, Cardiac Doppler and Color Doppler Indications:    R06.00 DYSPNEA  History:        Patient has prior history of Echocardiogram examinations, most                 recent 04/12/2019. Mitral Valve Prolapse, Signs/Symptoms:Dyspnea;                 Risk Factors:Hypertension. AO ROOT DILATION / PVC.  Sonographer:    Beryle Beams Referring Phys: [id number] Sauget T TU IMPRESSIONS  1. Left ventricular ejection fraction, by estimation, is 65 to 70%. The left ventricle has normal function. The left ventricle has no regional wall motion abnormalities. Left ventricular diastolic parameters were normal.  2. Right ventricular systolic function is normal. The right ventricular size is normal. There is normal pulmonary artery systolic pressure.  3. Right atrial size was mildly dilated.  4. The mitral valve is normal in structure. Trivial mitral valve regurgitation. No evidence of mitral stenosis.  5. The aortic valve is normal in structure. Aortic valve  regurgitation is mild. No aortic stenosis is present. FINDINGS  Left Ventricle: Left ventricular ejection fraction, by estimation, is 65 to 70%. The left ventricle has normal function. The left ventricle has no regional wall motion abnormalities. The left ventricular internal cavity size was normal in size. There is  no left ventricular hypertrophy. Left ventricular diastolic parameters were normal. Right Ventricle: The right ventricular size is normal. Right vetricular wall thickness was not well visualized. Right ventricular systolic function is normal. There is normal pulmonary artery systolic pressure. The tricuspid regurgitant velocity is 2.33 m/s, and with an assumed right atrial pressure of 3 mmHg, the estimated right ventricular systolic pressure is 29.9 mmHg. Left Atrium: Left atrial size was normal in size. Right Atrium: Right atrial size was mildly dilated. Pericardium: There  is no evidence of pericardial effusion. Mitral Valve: The mitral valve is normal in structure. Trivial mitral valve regurgitation. No evidence of mitral valve stenosis. Tricuspid Valve: The tricuspid valve is normal in structure. Tricuspid valve regurgitation is trivial. Aortic Valve: The aortic valve is normal in structure. Aortic valve regurgitation is mild. Aortic regurgitation PHT measures 420 msec. No aortic stenosis is present. Aortic valve mean gradient measures 7.0 mmHg. Aortic valve peak gradient measures 12.5 mmHg. Aortic valve area, by VTI measures 2.23 cm. Pulmonic Valve: The pulmonic valve was not well visualized. Pulmonic valve regurgitation is not visualized. Aorta: The aortic root and ascending aorta are structurally normal, with no evidence of dilitation. IAS/Shunts: The atrial septum is grossly normal.  LEFT VENTRICLE PLAX 2D LVIDd:         4.50 cm     Diastology LVIDs:         3.20 cm     LV e' medial:    9.68 cm/s LV PW:         1.00 cm     LV E/e' medial:  9.6 LV IVS:        0.90 cm     LV e' lateral:   13.60  cm/s LVOT diam:     2.00 cm     LV E/e' lateral: 6.8 LV SV:         89 LV SV Index:   47 LVOT Area:     3.14 cm  LV Volumes (MOD) LV vol d, MOD A2C: 89.4 ml LV vol d, MOD A4C: 90.1 ml LV vol s, MOD A2C: 30.6 ml LV vol s, MOD A4C: 20.7 ml LV SV MOD A2C:     58.8 ml LV SV MOD A4C:     90.1 ml LV SV MOD BP:      64.4 ml RIGHT VENTRICLE             IVC RV S prime:     14.60 cm/s  IVC diam: 1.60 cm TAPSE (M-mode): 2.6 cm LEFT ATRIUM             Index        RIGHT ATRIUM           Index LA diam:        3.30 cm 1.74 cm/m   RA Area:     18.60 cm LA Vol (A2C):   65.0 ml 34.35 ml/m  RA Volume:   53.10 ml  28.06 ml/m LA Vol (A4C):   38.2 ml 20.19 ml/m LA Biplane Vol: 51.7 ml 27.32 ml/m  AORTIC VALVE                     PULMONIC VALVE AV Area (Vmax):    2.18 cm      PV Vmax:       0.69 m/s AV Area (Vmean):   2.00 cm      PV Vmean:      45.200 cm/s AV Area (VTI):     2.23 cm      PV VTI:        0.150 m AV Vmax:           177.00 cm/s   PV Peak grad:  1.9 mmHg AV Vmean:          123.000 cm/s  PV Mean grad:  1.0 mmHg AV VTI:            0.399 m AV Peak Grad:  12.5 mmHg AV Mean Grad:      7.0 mmHg LVOT Vmax:         123.00 cm/s LVOT Vmean:        78.400 cm/s LVOT VTI:          0.283 m LVOT/AV VTI ratio: 0.71 AI PHT:            420 msec  AORTA Ao Root diam: 3.00 cm MITRAL VALVE               TRICUSPID VALVE MV Area (PHT): 3.27 cm    TR Peak grad:   21.7 mmHg MV Decel Time: 232 msec    TR Vmax:        233.00 cm/s MR Peak grad: 81.0 mmHg MR Mean grad: 55.0 mmHg    SHUNTS MR Vmax:      450.00 cm/s  Systemic VTI:  0.28 m MR Vmean:     346.0 cm/s   Systemic Diam: 2.00 cm MV E velocity: 93.00 cm/s MV A velocity: 50.70 cm/s MV E/A ratio:  1.83 Mertie Moores MD Electronically signed by Mertie Moores MD Signature Date/Time: 01/02/2022/11:15:49 AM    Final      Assessment and Plan:   Dyspnea on exertion - BNP not elevated, HST negative, EKG not ischemia - echo with preserved EF, normal diastolic function and no significant  valvular disease - CT chest with new cluster of lung nodules, but could not rule out infection - no leukocytosis - pt does not appear volume up on exam - thoracic aortic aneurysm stable on CT, BP well controlled - suspect dyspnea may be related to age vs new lung nodules - do not suspect chronotropic incompetence since his telemetry shows increase in heart rate with activity - I have asked him to hold off on additional statin for now until his symptoms resolve and we have heart monitor results   Lung nodules - will see pulmonology - will request an appt earlier since patient does report unintentional weight loss - daughter at bedside also reports her sons are positive for alpha-1 antitrypsin deficiency   Palpitations PVCs - he notes significant worsening of palpitations since flu shot - given ongoing dyspnea on exertion, will place 14 day heart monitor   Coronary atherosclerosis - no chest pain, negative enzymes, nonischemic EKG - PCP recently started 40 mg lipitor  - will hold this for now while symptoms resolve - may re-challenge later   Sinus bradycardia Palpitations - pt is bradycardic in the low 50s at baseline - avoid AV nodal blocking agents   OK for discharge from cardiac standpoint. Will have him follow up with Dr. Martinique after heart monitor.    Risk Assessment/Risk Scores:       For questions or updates, please contact [place] Please consult [email or web address] for contact info under    Signed, Ledora Bottcher, PA  01/03/2022 11:55 AM  Patient seen and examined.  Agree with below documentation.  Wayne Rodriguez is a 76 year old male with a history of PVCs, hypertension, aortic dilatation who we are consulted by Dr. Darrick Meigs for evaluation for dyspnea.  He follows with Dr. Martinique.  He reports that he had flu vaccine and started on statin 2 weeks ago.  Since that time has been having episodes of palpitations where feels like heart is fluttering.  During these  episodes, he feels weak and short of breath.  Has happened about 4 times over the last 2 weeks.  He presented to [facility]  [place] on 01/01/2022 with these symptoms.  Work-up in ED includes BNP 46, negative troponin, D-dimer 0.6, CTA negative for PE, normal TSH.  Was noted on CT to have lung nodules, referred to pulmonology.  EKG shows normal sinus rhythm, rate 53, first-degree AV block.  Echocardiogram showed EF 65 to 70%, normal RV function, no significant valvular disease.  On exam, patient is alert and oriented, regular rate and rhythm, no murmurs, lungs CTAB, no LE edema or JVD.  No significant arrhythmias on telemetry.    For his palpitations, will plan Zio patch x2 weeks to evaluate for arrhythmia.  Given symptoms started since started using statin, will hold statin for now  Donato Heinz, MD

## 2022-01-03 NOTE — Progress Notes (Signed)
PT Cancellation Note ? ?Patient Details ?Name: Wayne Rodriguez ?MRN: [id number] ?DOB: January 23, 1946 ? ? ?Cancelled Treatment:    Reason Eval/Treat Not Completed: PT screened, no needs identified, will sign off  Pt observed ambulating independently in hallway and talking on the phone simultaneously.   No acute PT needs identified at this time.  PT to sign off. ? ? ?Kati L Payson ?01/03/2022, 12:17 PM ?Jannette Spanner PT, DPT ?Acute Rehabilitation Services ?Pager: [phone] ?Office: [phone] ? ?

## 2022-01-03 NOTE — Discharge Summary (Signed)
Physician Discharge Summary   Patient: Wayne Rodriguez MRN: [id number] DOB: Feb 23, 1946  Admit date:     01/01/2022  Discharge date: 01/03/22  Discharge Physician: Oswald Hillock   PCP: Orpah Melter, MD   Recommendations at discharge:   Follow-up pulmonology in 3 months Follow-up cardiology as outpatient  Discharge Diagnoses: Principal Problem:   Dyspnea on exertion Active Problems:   HTN (hypertension)   Lung nodule   Sinus bradycardia   Hypokalemia   Ascending aortic aneurysm  Resolved Problems:   * No resolved hospital problems. *   Hospital Course: 76 year old male with medical history of PVCs, aortic root dilatation, hypertension, coronary artery calcification, mitral valve prolapse and history of lung nodule presented with increasing shortness of breath.  In the ED he was found to bradycardic with heart rate in 40s and 50s, intermittently hypotensive.  D-dimer is elevated, CT chest showed no PE but showed interval development of new cluster of nodules to the right medial anterior upper lobe.  Infectious/inflammatory versus malignancy not excluded.  Descending aortic root aneurysm, stable at 4.2 cm.  Patient was started on IV Rocephin and Zithromax for presumed pneumonia and transferred to [facility] long [facility].    Assessment and Plan:  Dyspnea on exertion -Unclear etiology -CT chest negative for PE -Echocardiogram obtained is unremarkable -Ambulatory pulse ox was normal, 92% on room air on ambulation    Hypertension -Blood pressure has been elevated -Continue home medication including Norvasc, lisinopril, HCTZ   Bradycardia/PVCs -Heart rate chronically low in 40s and 50s -Cardiology saw the patient and recommended Zio patch for PVCs -As per cardiology patient can be discharged, also cardiology recommends to discontinue atorvastatin since patient's symptoms started after he was put on atorvastatin 2 weeks ago   Ascending aortic aneurysm -Stable at 4.2  cm -Patient follows CT surgeon Dr. Roxan Hockey  Lung nodules -CT chest showed new cluster of nodules to the right medial anterior upper lobe; infectious versus inflammatory -Called and discussed with pulmonology, Dr. Elsworth Soho -He reviewed the CT scan from January and March, I did not find any significant change -He recommends the patient to follow-up with pulmonology in 3 months with repeat CT chest.  No acute intervention recommended. -Patient has been told that he has an appointment to see pulmonology as outpatient -I will discharge patient on Augmentin 1 tablet p.o. twice daily for 5 days in case of possibility of infectious etiology        Consultants: Cardiology Procedures performed:  Disposition: Home Diet recommendation:  Discharge Diet Orders (From admission, onward)     Start     Ordered   01/03/22 0000  Diet - low sodium heart healthy        01/03/22 1437           Regular diet  DISCHARGE MEDICATION: Allergies as of 01/03/2022       Reactions   Beta Adrenergic Blockers    Bradycardia Other reaction(s): bradycardia   Codeine    REACTION: nausea/vomiting        Medication List     STOP taking these medications    atorvastatin 40 MG tablet Commonly known as: LIPITOR       TAKE these medications    acetaminophen 500 MG tablet Commonly known as: TYLENOL Take 1,000 mg by mouth every 6 (six) hours as needed for mild pain.   amLODipine 5 MG tablet Commonly known as: Norvasc Take 1 tablet (5 mg total) by mouth daily.   aspirin 81 MG tablet  Take 81 mg by mouth daily.   carboxymethylcellulose 0.5 % Soln Commonly known as: REFRESH PLUS Place 1 drop into both eyes daily as needed (dry eyes).   Coenzyme Q10 200 MG capsule Take 200 mg by mouth daily.   ibuprofen 200 MG tablet Commonly known as: ADVIL Take 400 mg by mouth every 8 (eight) hours as needed for mild pain.   lisinopril-hydrochlorothiazide 20-12.5 MG tablet Commonly known as:  ZESTORETIC Take 2 tablets by mouth daily.   nitroGLYCERIN 0.4 MG SL tablet Commonly known as: NITROSTAT Place 1 tablet (0.4 mg total) under the tongue every 5 (five) minutes as needed. What changed: reasons to take this   PROBIOTIC DAILY PO Take 1 capsule by mouth daily. Reported on 01/15/2016        Follow-up Information     Rigoberto Noel, MD. Schedule an appointment as soon as possible for a visit in 3 month(s).   Specialty: Pulmonary Disease Contact information: [address] [place] [address] [phone]                 Discharge Exam: Danley Danker Weights   01/01/22 1740 01/02/22 0000  Weight: 68 kg 73.8 kg   General-appears in no acute distress Heart-S1-S2, regular, no murmur auscultated Lungs-clear to auscultation bilaterally, no wheezing or crackles auscultated Abdomen-soft, nontender, no organomegaly Extremities-no edema in the lower extremities Neuro-alert, oriented x3, no focal deficit noted  Condition at discharge: good  The results of significant diagnostics from this hospitalization (including imaging, microbiology, ancillary and laboratory) are listed below for reference.   Imaging Studies: CT Angio Chest PE W and/or Wo Contrast  Result Date: 01/01/2022 CLINICAL DATA:  Pulmonary embolism (PE) suspected, positive D-dimer. intermittent SOB, pain to upper back, weakness x 2-3 days 80 cc Omnipaque 350 EXAM: CT ANGIOGRAPHY CHEST WITH CONTRAST TECHNIQUE: Multidetector CT imaging of the chest was performed using the standard protocol during bolus administration of intravenous contrast. Multiplanar CT image reconstructions and MIPs were obtained to evaluate the vascular anatomy. RADIATION DOSE REDUCTION: This exam was performed according to the departmental dose-optimization program which includes automated exposure control, adjustment of the mA and/or kV according to patient size and/or use of iterative reconstruction technique. CONTRAST:  47mL  OMNIPAQUE IOHEXOL 350 MG/ML SOLN COMPARISON:  CT chest 11/27/2021, CT angiography chest 11/14/2020, PET CT 07/30/2016, CT angiography 11/18/2019 FINDINGS: Cardiovascular: Satisfactory opacification of the pulmonary arteries to the segmental level. No evidence of pulmonary embolism. Normal heart size. No significant pericardial effusion. Stable ascending thoracic aorta aneurysm measuring up to 4.2 cm. The descending thoracic aorta is normal in caliber. Mild atherosclerotic plaque of the thoracic aorta. At least 2 vessel coronary artery calcifications. Mediastinum/Nodes: No enlarged mediastinal, hilar, or axillary lymph nodes. Thyroid gland, trachea, and esophagus demonstrate no significant findings. Lungs/Pleura: Right apex surgical changes/scarring again noted that appear grossly stable. Interval development of a cluster of nodules measuring up to 6 mm (6:87) within the lingula with query associated atelectasis (6:90). Similar-appearing cluster of nodules (measuring up to 5 mm) within the right medial anterior upper lobe (7: 153-190). These nodules were new on CT angiography chest 11/27/2021. Grossly stable 8 x 4 mm left upper lobe pulmonary nodule. No pulmonary mass. No pleural effusion. No pneumothorax. Upper Abdomen: Scattered calcifications throughout the spleen likely sequelae of prior granulomatous disease. No acute abnormality. Musculoskeletal: No abdominal wall hernia or abnormality. No suspicious lytic or blastic osseous lesions. No acute displaced fracture. Multilevel degenerative changes of the spine. Review of the  MIP images confirms the above findings. IMPRESSION: 1. No pulmonary embolus. 2. Interval development of a cluster of nodules within the lingula with query associated atelectasis. Grossly stable 8 x 4 mm left upper lobe pulmonary nodule as well as similar-appearing cluster of nodules within the right medial anterior upper lobe - these nodules were new on CT angiography chest 11/27/2021. Query  infection/inflammation versus malignancy not excluded. Additional imaging evaluation or consultation with Pulmonology or Thoracic Surgery recommended. 3. Stable ascending thoracic aorta aneurysm (4.2 cm). No follow-up needed if patient is low-risk (and has no known or suspected primary neoplasm). Non-contrast chest CT can be considered in 12 months if patient is high-risk. This recommendation follows the consensus statement: Guidelines for Management of Incidental Pulmonary Nodules Detected on CT Images: From the Fleischner Society 2017; Radiology 2017; 284:228-243. 4. Aortic Atherosclerosis (ICD10-I70.0). Aortic aneurysm NOS (ICD10-I71.9). Electronically Signed   By: Iven Finn M.D.   On: 01/01/2022 22:18   DG Chest Port 1 View  Result Date: 01/01/2022 CLINICAL DATA:  Shortness of breath. EXAM: PORTABLE CHEST 1 VIEW COMPARISON:  07/26/2020 FINDINGS: The cardiac silhouette, mediastinal and hilar contours are normal and stable. The lungs are clear. No pulmonary lesions or pleural effusions. The bony thorax is intact. IMPRESSION: No acute cardiopulmonary findings. Electronically Signed   By: Marijo Sanes M.D.   On: 01/01/2022 18:38   ECHOCARDIOGRAM COMPLETE  Result Date: 01/02/2022    ECHOCARDIOGRAM REPORT   Patient Name:   Wayne Rodriguez Date of Exam: 01/02/2022 Medical Rec #:  [id number]        Height:       69.0 in Accession #:    [id number]       Weight:       162.7 lb Date of Birth:  04-28-1946        BSA:          1.892 m Patient Age:    89 years         BP:           135/64 mmHg Patient Gender: M                HR:           64 bpm. Exam Location:  Inpatient Procedure: 2D Echo, Cardiac Doppler and Color Doppler Indications:    R06.00 DYSPNEA  History:        Patient has prior history of Echocardiogram examinations, most                 recent 04/12/2019. Mitral Valve Prolapse, Signs/Symptoms:Dyspnea;                 Risk Factors:Hypertension. AO ROOT DILATION / PVC.  Sonographer:    Beryle Beams  Referring Phys: [id number] [place] T TU IMPRESSIONS  1. Left ventricular ejection fraction, by estimation, is 65 to 70%. The left ventricle has normal function. The left ventricle has no regional wall motion abnormalities. Left ventricular diastolic parameters were normal.  2. Right ventricular systolic function is normal. The right ventricular size is normal. There is normal pulmonary artery systolic pressure.  3. Right atrial size was mildly dilated.  4. The mitral valve is normal in structure. Trivial mitral valve regurgitation. No evidence of mitral stenosis.  5. The aortic valve is normal in structure. Aortic valve regurgitation is mild. No aortic stenosis is present. FINDINGS  Left Ventricle: Left ventricular ejection fraction, by estimation, is 65 to 70%. The left ventricle has normal function. The left  ventricle has no regional wall motion abnormalities. The left ventricular internal cavity size was normal in size. There is  no left ventricular hypertrophy. Left ventricular diastolic parameters were normal. Right Ventricle: The right ventricular size is normal. Right vetricular wall thickness was not well visualized. Right ventricular systolic function is normal. There is normal pulmonary artery systolic pressure. The tricuspid regurgitant velocity is 2.33 m/s, and with an assumed right atrial pressure of 3 mmHg, the estimated right ventricular systolic pressure is 79.0 mmHg. Left Atrium: Left atrial size was normal in size. Right Atrium: Right atrial size was mildly dilated. Pericardium: There is no evidence of pericardial effusion. Mitral Valve: The mitral valve is normal in structure. Trivial mitral valve regurgitation. No evidence of mitral valve stenosis. Tricuspid Valve: The tricuspid valve is normal in structure. Tricuspid valve regurgitation is trivial. Aortic Valve: The aortic valve is normal in structure. Aortic valve regurgitation is mild. Aortic regurgitation PHT measures 420 msec. No aortic stenosis  is present. Aortic valve mean gradient measures 7.0 mmHg. Aortic valve peak gradient measures 12.5 mmHg. Aortic valve area, by VTI measures 2.23 cm. Pulmonic Valve: The pulmonic valve was not well visualized. Pulmonic valve regurgitation is not visualized. Aorta: The aortic root and ascending aorta are structurally normal, with no evidence of dilitation. IAS/Shunts: The atrial septum is grossly normal.  LEFT VENTRICLE PLAX 2D LVIDd:         4.50 cm     Diastology LVIDs:         3.20 cm     LV e' medial:    9.68 cm/s LV PW:         1.00 cm     LV E/e' medial:  9.6 LV IVS:        0.90 cm     LV e' lateral:   13.60 cm/s LVOT diam:     2.00 cm     LV E/e' lateral: 6.8 LV SV:         89 LV SV Index:   47 LVOT Area:     3.14 cm  LV Volumes (MOD) LV vol d, MOD A2C: 89.4 ml LV vol d, MOD A4C: 90.1 ml LV vol s, MOD A2C: 30.6 ml LV vol s, MOD A4C: 20.7 ml LV SV MOD A2C:     58.8 ml LV SV MOD A4C:     90.1 ml LV SV MOD BP:      64.4 ml RIGHT VENTRICLE             IVC RV S prime:     14.60 cm/s  IVC diam: 1.60 cm TAPSE (M-mode): 2.6 cm LEFT ATRIUM             Index        RIGHT ATRIUM           Index LA diam:        3.30 cm 1.74 cm/m   RA Area:     18.60 cm LA Vol (A2C):   65.0 ml 34.35 ml/m  RA Volume:   53.10 ml  28.06 ml/m LA Vol (A4C):   38.2 ml 20.19 ml/m LA Biplane Vol: 51.7 ml 27.32 ml/m  AORTIC VALVE                     PULMONIC VALVE AV Area (Vmax):    2.18 cm      PV Vmax:       0.69 m/s AV Area (Vmean):   2.00 cm  PV Vmean:      45.200 cm/s AV Area (VTI):     2.23 cm      PV VTI:        0.150 m AV Vmax:           177.00 cm/s   PV Peak grad:  1.9 mmHg AV Vmean:          123.000 cm/s  PV Mean grad:  1.0 mmHg AV VTI:            0.399 m AV Peak Grad:      12.5 mmHg AV Mean Grad:      7.0 mmHg LVOT Vmax:         123.00 cm/s LVOT Vmean:        78.400 cm/s LVOT VTI:          0.283 m LVOT/AV VTI ratio: 0.71 AI PHT:            420 msec  AORTA Ao Root diam: 3.00 cm MITRAL VALVE               TRICUSPID VALVE MV  Area (PHT): 3.27 cm    TR Peak grad:   21.7 mmHg MV Decel Time: 232 msec    TR Vmax:        233.00 cm/s MR Peak grad: 81.0 mmHg MR Mean grad: 55.0 mmHg    SHUNTS MR Vmax:      450.00 cm/s  Systemic VTI:  0.28 m MR Vmean:     346.0 cm/s   Systemic Diam: 2.00 cm MV E velocity: 93.00 cm/s MV A velocity: 50.70 cm/s MV E/A ratio:  1.83 Mertie Moores MD Electronically signed by Mertie Moores MD Signature Date/Time: 01/02/2022/11:15:49 AM    Final     Microbiology: Results for orders placed or performed during the hospital encounter of 01/01/22  Resp Panel by RT-PCR (Flu A&B, Covid) Nasopharyngeal Swab     Status: None   Collection Time: 01/01/22  6:07 PM   Specimen: Nasopharyngeal Swab; Nasopharyngeal(NP) swabs in vial transport medium  Result Value Ref Range Status   SARS Coronavirus 2 by RT PCR NEGATIVE NEGATIVE Final    Comment: (NOTE) SARS-CoV-2 target nucleic acids are NOT DETECTED.  The SARS-CoV-2 RNA is generally detectable in upper respiratory specimens during the acute phase of infection. The lowest concentration of SARS-CoV-2 viral copies this assay can detect is 138 copies/mL. A negative result does not preclude SARS-Cov-2 infection and should not be used as the sole basis for treatment or other patient management decisions. A negative result may occur with  improper specimen collection/handling, submission of specimen other than nasopharyngeal swab, presence of viral mutation(s) within the areas targeted by this assay, and inadequate number of viral copies(<138 copies/mL). A negative result must be combined with clinical observations, patient history, and epidemiological information. The expected result is Negative.  Fact Sheet for Patients:  EntrepreneurPulse.com.au  Fact Sheet for Healthcare Providers:  IncredibleEmployment.be  This test is no t yet approved or cleared by the [place] FDA and  has been authorized for detection and/or  diagnosis of SARS-CoV-2 by FDA under an Emergency Use Authorization (EUA). This EUA will remain  in effect (meaning this test can be used) for the duration of the COVID-19 declaration under Section 564(b)(1) of the Act, 21 U.S.C.section 360bbb-3(b)(1), unless the authorization is terminated  or revoked sooner.       Influenza A by PCR NEGATIVE NEGATIVE Final   Influenza B by PCR NEGATIVE NEGATIVE  Final    Comment: (NOTE) The Xpert Xpress SARS-CoV-2/FLU/RSV plus assay is intended as an aid in the diagnosis of influenza from Nasopharyngeal swab specimens and should not be used as a sole basis for treatment. Nasal washings and aspirates are unacceptable for Xpert Xpress SARS-CoV-2/FLU/RSV testing.  Fact Sheet for Patients: EntrepreneurPulse.com.au  Fact Sheet for Healthcare Providers: IncredibleEmployment.be  This test is not yet approved or cleared by the [place] FDA and has been authorized for detection and/or diagnosis of SARS-CoV-2 by FDA under an Emergency Use Authorization (EUA). This EUA will remain in effect (meaning this test can be used) for the duration of the COVID-19 declaration under Section 564(b)(1) of the Act, 21 U.S.C. section 360bbb-3(b)(1), unless the authorization is terminated or revoked.  Performed at [facility], [address] [place], [place] [address]   Blood culture (routine x 2)     Status: None (Preliminary result)   Collection Time: 01/01/22 11:23 PM   Specimen: BLOOD  Result Value Ref Range Status   Specimen Description   Final    BLOOD LEFT ANTECUBITAL Performed at [facility], [address] [place], [place] [address]    Special Requests   Final    BOTTLES DRAWN AEROBIC AND ANAEROBIC Blood Culture results may not be optimal due to an excessive volume of blood received in culture bottles Performed at [facility], [address] [place], [place] [address]     Culture   Final    NO GROWTH 1 DAY Performed at [facility], [place] [address], [place], [place] [address]    Report Status PENDING  Incomplete  Blood culture (routine x 2)     Status: None (Preliminary result)   Collection Time: 01/01/22 11:24 PM   Specimen: BLOOD  Result Value Ref Range Status   Specimen Description   Final    BLOOD RIGHT ANTECUBITAL Performed at [facility], [address] [place], [place] [address]    Special Requests   Final    BOTTLES DRAWN AEROBIC AND ANAEROBIC Blood Culture adequate volume Performed at [facility], [address] [place], [place] [address]    Culture   Final    NO GROWTH 1 DAY Performed at [facility], [place] [address], [place],  [address]    Report Status PENDING  Incomplete    Labs: CBC: Recent Labs  Lab 01/01/22 1807  WBC 6.4  HGB 14.4  HCT 42.4  MCV 84.5  PLT 024   Basic Metabolic Panel: Recent Labs  Lab 01/01/22 1807 01/02/22 0443  NA 135 137  K 3.4* 3.8  CL 99 101  CO2 29 29  GLUCOSE 96 93  BUN 18 16  CREATININE 0.99 0.93  CALCIUM 9.0 9.0   Liver Function Tests: No results for input(s): AST, ALT, ALKPHOS, BILITOT, PROT, ALBUMIN in the last 168 hours. CBG: No results for input(s): GLUCAP in the last 168 hours.  Discharge time spent: greater than 30 minutes.  Signed: Oswald Hillock, MD [facility] 01/03/2022

## 2022-01-03 NOTE — Telephone Encounter (Signed)
Called patient and he stated he already has an appt with Dr Elsworth Soho. ? ?With Pulmonology Davonna Belling V. Elsworth Soho, MD)01/29/2022 at  3:00 PM ? ?Patient has no questions noted.  ? ?Nothing further  ?

## 2022-01-03 NOTE — Telephone Encounter (Signed)
Patient contacted the office while admitted to [facility] stating that on his recent in hospital CT there is a new lung nodule that was not present last CT. Advised that the physicians in hospital would make appropriate appointments necessary while he was admitted. He acknowledged receipt.   ?

## 2022-01-03 NOTE — Progress Notes (Unsigned)
Enrolled for [employer] to mail a ZIO XT long term holter monitor to the patients address on file.  Letter with instructions mailed to patient. 

## 2022-01-07 DIAGNOSIS — R002 Palpitations: Secondary | ICD-10-CM

## 2022-01-07 DIAGNOSIS — I493 Ventricular premature depolarization: Secondary | ICD-10-CM | POA: Diagnosis not present

## 2022-01-07 LAB — CULTURE, BLOOD (ROUTINE X 2)
Culture: NO GROWTH
Culture: NO GROWTH
Special Requests: ADEQUATE

## 2022-01-29 ENCOUNTER — Ambulatory Visit: Payer: Medicare HMO | Admitting: Pulmonary Disease

## 2022-01-29 ENCOUNTER — Other Ambulatory Visit: Payer: Self-pay

## 2022-01-29 ENCOUNTER — Encounter: Payer: Self-pay | Admitting: Pulmonary Disease

## 2022-01-29 DIAGNOSIS — R911 Solitary pulmonary nodule: Secondary | ICD-10-CM | POA: Diagnosis not present

## 2022-01-29 DIAGNOSIS — R002 Palpitations: Secondary | ICD-10-CM

## 2022-01-29 DIAGNOSIS — I493 Ventricular premature depolarization: Secondary | ICD-10-CM | POA: Diagnosis not present

## 2022-01-29 NOTE — Assessment & Plan Note (Signed)
The left upper lobe nodule 8 x 4 mm has been noted in the past and has been stable dating back to 2017.  The cluster of new nodules was likely related to infection and is of no consequence.  He was treated with Augmentin for an episode of bronchitis.  We will reassess with CT chest without contrast in 6 months ?Previous PFTs have not shown any evidence of airway obstruction ?Interestingly, VATS biopsy of nodule in 2015 showed benign etiology with hyalinized nodule and on Katzenstein review showed iron oxide pigment and she raised the question of exposure to welding or other occupational exposure.  He worked as a Engineer, building services ?

## 2022-01-29 NOTE — Assessment & Plan Note (Signed)
Unclear cause of palpitations-this seems to be the main issue ?Event monitor only picked up few episodes of SVT.  At baseline he is bradycardic so I am not sure that the beta-blocker would be indicated. ?Defer to cardiology Dr. [place] best course of action here-he states that during the event monitor he was given Augmentin and his episodes had really decreased , may deserve another 14-days  Of event monitoring versus empiric treatment for SVTs ?

## 2022-01-29 NOTE — Progress Notes (Signed)
? ?Subjective:  ? ? Patient ID: Wayne Rodriguez, male    DOB: 11-25-1945, 76 y.o.   MRN: [id number] ? ?HPI ? ?76 year old remote smoker presents for evaluation of pulmonary nodules, palpitation and shortness of breath ? ?PMH -VATS biopsy right upper lobe nodule -benign, and oxidated pigment, Katzenstein review ?-Hypertension, heart murmur ?-Thoracic aortic aneurysm ? ?He received a flu shot in January and shortly after developed palpitations with shortness of breath.  He had an ED visit on 12/04/2021, CT angio chest was performed which showed stable left upper lobe nodule which had been noted prior and cluster of tiny nodules in the right upper lobe ?Episodic palpitations continued and he had another ED visit requiring hospitalization 01/01/2022 and transferred to [facility], CT angiogram chest again showed that the pulm nodules were stable but there was a new cluster of nodules in the lingula .  He was treated with Augmentin and evaluated by cardiology.  He was placed with an event monitor but he did not have significant palpitations while on the monitor. ?He felt that his breathing was much improved on the Augmentin, he now denies cough or wheezing ? ?He reports that during these episodes of palpitations which last several minutes he becomes very weak and he has to sit down.  Activity makes it worse and sitting down and taking a few deep breaths seems to relax him, he has measured his heart rate on a pulse oximeter as high as 114.  At baseline his heart rate runs in the 40s or 50s.  He has also had an episode during sleep ?He is accompanied by his daughter Cecille Rubin who corroborates history ? ?I have reviewed previous cardiology evaluation by Dr. Martinique, cardiac cath was normal in 02/2010 ?I have reviewed previous T CTS evaluation for nodules and aneurysm ? ?Significant tests/ events reviewed ? ?14-day event monitor 01/2022 -few runs of SVT ? ?CT chest 11/2021 8 x 4 mm left upper lobe pulmonary nodule +cluster of nodules  within the right medial anterior upper lobe  ? ?CTA chest 12/2021 >>Stable ascending thoracic aorta aneurysm (4.2 cm).  ?Stable above nodules + new cluster of nodules in lingula ?CTchest 11/2020 - stable LUL nodule dating back to 2017 ? ?03/2014 PFTs ratio 67, FEV1 91%, FVC 101%, TLC 108%, DLCO 84% ? ? ?Past Medical History:  ?Diagnosis Date  ? Aortic root dilatation (HCC)   ? a. 09/2010 CT: 4.1cm Max Asc Ao diam, 3.7cm Max Desc Ao diam;; s/p MRI/MRA in April 2013  ? Back pain   ? Cancer [facility])   ? skin cancer removed from face  ? Chest pain   ? a. 02/2010 Cath: normal  ? Decreased exercise tolerance   ? a. 02/2012  ? GERD (gastroesophageal reflux disease)   ? HTN (hypertension)   ? Mitral valve prolapse   ? Palpitations   ? a. h/o palps r/t caffeine - has occurred again 02/2012  ? Pulmonary nodule   ? 3 nodules removed over 3 years by Dr Roxan Hockey  ? PVC's (premature ventricular contractions) 07/17/2015  ? Vertigo   ? hx of  ? ?Past Surgical History:  ?Procedure Laterality Date  ? [place]  ? CARDIAC CATHETERIZATION    ? no PCI  ? COLONOSCOPY    ? COLONOSCOPY W/ POLYPECTOMY    ? EYE SURGERY  1997  ? remove foreign body; right/right eye is blurry  ? VIDEO ASSISTED THORACOSCOPY (VATS)/WEDGE RESECTION Right 03/17/2014  ? Procedure: VIDEO ASSISTED THORACOSCOPY (VATS)/WEDGE  RESECTION;  Surgeon: Melrose Nakayama, MD;  Location: [place];  Service: Thoracic;  Laterality: Right;  ? ? ?Allergies  ?Allergen Reactions  ? Beta Adrenergic Blockers   ?  Bradycardia ? ?Other reaction(s): bradycardia  ? Codeine   ?  REACTION: nausea/vomiting  ? ? ?Social History  ? ?Socioeconomic History  ? Marital status: Married  ?  Spouse name: Not on file  ? Number of children: Not on file  ? Years of education: Not on file  ? Highest education level: Not on file  ?Occupational History  ? Not on file  ?Tobacco Use  ? Smoking status: Former  ?  Packs/day: 1.00  ?  Years: 30.00  ?  Pack years: 30.00  ?  Types: Cigarettes  ?  Start date:  03/08/1962  ?  Quit date: 03/08/1992  ?  Years since quitting: 29.9  ? Smokeless tobacco: Former  ?  Types: Snuff, Chew  ?  Quit date: 01/06/2013  ?Vaping Use  ? Vaping Use: Never used  ?Substance and Sexual Activity  ? Alcohol use: No  ? Drug use: No  ? Sexual activity: Not Currently  ?Other Topics Concern  ? Not on file  ?Social History Narrative  ? Not on file  ? ?Social Determinants of Health  ? ?Financial Resource Strain: Not on file  ?Food Insecurity: Not on file  ?Transportation Needs: Not on file  ?Physical Activity: Not on file  ?Stress: Not on file  ?Social Connections: Not on file  ?Intimate Partner Violence: Not on file  ? ? ?Family History  ?Problem Relation Age of Onset  ? Lung cancer Mother   ? Colon cancer Sister 73  ? Colon polyps Neg Hx   ? Esophageal cancer Neg Hx   ? Rectal cancer Neg Hx   ? Stomach cancer Neg Hx   ? ? ? ?Review of Systems ?Constitutional: negative for anorexia, fevers and sweats  ?Eyes: negative for irritation, redness and visual disturbance  ?Ears, nose, mouth, throat, and face: negative for earaches, epistaxis, nasal congestion and sore throat  ?Respiratory: negative for cough, dyspnea on exertion, sputum and wheezing  ?Cardiovascular: negative for chest pain,  lower extremity edema, orthopnea and syncope  ?Gastrointestinal: negative for abdominal pain, constipation, diarrhea, melena, nausea and vomiting  ?Genitourinary:negative for dysuria, frequency and hematuria  ?Hematologic/lymphatic: negative for bleeding, easy bruising and lymphadenopathy  ?Musculoskeletal:negative for arthralgias, muscle weakness and stiff joints  ?Neurological: negative for coordination problems, gait problems, headaches and weakness  ?Endocrine: negative for diabetic symptoms including polydipsia, polyuria and weight loss ? ?   ?Objective:  ? Physical Exam ? ?Gen. Pleasant, well-nourished, in no distress, anxious affect ?ENT - no pallor,icterus, no post nasal drip ?Neck: No JVD, no thyromegaly, no carotid  bruits ?Lungs: no use of accessory muscles, no dullness to percussion, clear without rales or rhonchi  ?Cardiovascular: Rhythm regular, heart sounds  normal, no murmurs or gallops, no peripheral edema ?Abdomen: soft and non-tender, no hepatosplenomegaly, BS normal. ?Musculoskeletal: No deformities, no cyanosis or clubbing ?Neuro:  alert, non focal ? ? ? ?   ?Assessment & Plan:  ? ? ?

## 2022-01-29 NOTE — Addendum Note (Signed)
Addended by: Darliss Ridgel on: 01/29/2022 04:09 PM ? ? Modules accepted: Orders ? ?

## 2022-01-29 NOTE — Patient Instructions (Signed)
?  X CT chest wo contrast in august to follow up on nodule ? ?X I will send note to dr Martinique ?

## 2022-02-12 DIAGNOSIS — M9901 Segmental and somatic dysfunction of cervical region: Secondary | ICD-10-CM | POA: Diagnosis not present

## 2022-02-12 DIAGNOSIS — M9903 Segmental and somatic dysfunction of lumbar region: Secondary | ICD-10-CM | POA: Diagnosis not present

## 2022-02-12 DIAGNOSIS — M62830 Muscle spasm of back: Secondary | ICD-10-CM | POA: Diagnosis not present

## 2022-02-12 DIAGNOSIS — M9902 Segmental and somatic dysfunction of thoracic region: Secondary | ICD-10-CM | POA: Diagnosis not present

## 2022-02-14 DIAGNOSIS — M62830 Muscle spasm of back: Secondary | ICD-10-CM | POA: Diagnosis not present

## 2022-02-14 DIAGNOSIS — M9902 Segmental and somatic dysfunction of thoracic region: Secondary | ICD-10-CM | POA: Diagnosis not present

## 2022-02-14 DIAGNOSIS — M9901 Segmental and somatic dysfunction of cervical region: Secondary | ICD-10-CM | POA: Diagnosis not present

## 2022-02-14 DIAGNOSIS — M9903 Segmental and somatic dysfunction of lumbar region: Secondary | ICD-10-CM | POA: Diagnosis not present

## 2022-02-18 DIAGNOSIS — M9902 Segmental and somatic dysfunction of thoracic region: Secondary | ICD-10-CM | POA: Diagnosis not present

## 2022-02-18 DIAGNOSIS — M9903 Segmental and somatic dysfunction of lumbar region: Secondary | ICD-10-CM | POA: Diagnosis not present

## 2022-02-18 DIAGNOSIS — M9901 Segmental and somatic dysfunction of cervical region: Secondary | ICD-10-CM | POA: Diagnosis not present

## 2022-02-18 DIAGNOSIS — M62830 Muscle spasm of back: Secondary | ICD-10-CM | POA: Diagnosis not present

## 2022-02-20 DIAGNOSIS — M9903 Segmental and somatic dysfunction of lumbar region: Secondary | ICD-10-CM | POA: Diagnosis not present

## 2022-02-20 DIAGNOSIS — M9902 Segmental and somatic dysfunction of thoracic region: Secondary | ICD-10-CM | POA: Diagnosis not present

## 2022-02-20 DIAGNOSIS — M9901 Segmental and somatic dysfunction of cervical region: Secondary | ICD-10-CM | POA: Diagnosis not present

## 2022-02-20 DIAGNOSIS — M62830 Muscle spasm of back: Secondary | ICD-10-CM | POA: Diagnosis not present

## 2022-02-21 DIAGNOSIS — M9901 Segmental and somatic dysfunction of cervical region: Secondary | ICD-10-CM | POA: Diagnosis not present

## 2022-02-21 DIAGNOSIS — M62830 Muscle spasm of back: Secondary | ICD-10-CM | POA: Diagnosis not present

## 2022-02-21 DIAGNOSIS — M9902 Segmental and somatic dysfunction of thoracic region: Secondary | ICD-10-CM | POA: Diagnosis not present

## 2022-02-21 DIAGNOSIS — M9903 Segmental and somatic dysfunction of lumbar region: Secondary | ICD-10-CM | POA: Diagnosis not present

## 2022-02-22 NOTE — Progress Notes (Signed)
?Cardiology Office Note:   ? ?Date:  02/27/2022  ? ?ID:  Wayne Rodriguez, DOB July 11, 1946, MRN [id number] ? ?PCP:  Orpah Melter, MD  ?Cardiologist:  Haydin Calandra Martinique, MD  ?Electrophysiologist:  None  ? ?Referring MD: Orpah Melter, MD  ? ?Chief Complaint  ?Patient presents with  ? Palpitations  ? ? ?History of Present Illness:   ? ?Wayne Rodriguez is a 76 y.o. male seen for evaluation of atypical chest pain and palpitations. He has a hx of PVCs, aortic root dilatation, hypertension, coronary artery calcification, mitral valve prolapse and history of lung nodule.  Previous cardiac catheterization on 02/26/2010 demonstrated right dominant system with mild irregularities however no significant stenosis.  Last echocardiogram obtained on 04/12/2019 showed EF 60 to 65%, mild basal septal hypertrophy, mild posterior leaflet mitral valve prolapse, myxomatous mitral valve with mild thickening of leaflet, mild to moderate MR, mild dilatation of the ascending aorta measuring at 41 mm.  CT of the chest obtained on 06/01/2019 showed minimal increase in the ascending thoracic aortic aneurysm measuring at 4.5 cm, new 4 mm right upper lobe pulmonary nodule, aortic atherosclerosis and two-vessel coronary artery calcification.  He was seen by Dr. Roxan Hockey in July 2020, Norvasc 5 mg daily was added to help with blood pressure control. Follow up CT in January 2021 was unchanged.  ? ?He was seen in the ED on 07/26/20 with atypical chest pain. Seen by cardiology coverage. Ecg was normal. Troponin was normal x 2. D dimer normal. ? ?He was admitted in February 2023 with dyspnea. States this occurred the day after a flu shot and after taking a couple of doses of statin. CT negative for PE but showed some new nodules ? Inflammatory. Treated for PNA with amoxicillin. Was intermittently bradycardic and had PVCs. Seen by our service and event monitor recommended. Echo was normal. BNP was normal. Statin also held since symptoms seemed to occur  after starting this.  ? ?Subsequent event monitor showed no serious arrhythmia and no significant bradycardia or pauses. Some PVCs.  ? ?He feels well on follow up today. No chest pain or dyspnea. No palpitations or dizziness.  ? ? ? ?Past Medical History:  ?Diagnosis Date  ? Aortic root dilatation (HCC)   ? a. 09/2010 CT: 4.1cm Max Asc Ao diam, 3.7cm Max Desc Ao diam;; s/p MRI/MRA in April 2013  ? Back pain   ? Cancer [facility])   ? skin cancer removed from face  ? Chest pain   ? a. 02/2010 Cath: normal  ? Decreased exercise tolerance   ? a. 02/2012  ? GERD (gastroesophageal reflux disease)   ? HTN (hypertension)   ? Mitral valve prolapse   ? Palpitations   ? a. h/o palps r/t caffeine - has occurred again 02/2012  ? Pulmonary nodule   ? 3 nodules removed over 3 years by Dr Roxan Hockey  ? PVC's (premature ventricular contractions) 07/17/2015  ? Vertigo   ? hx of  ? ? ?Past Surgical History:  ?Procedure Laterality Date  ? [place]  ? CARDIAC CATHETERIZATION    ? no PCI  ? COLONOSCOPY    ? COLONOSCOPY W/ POLYPECTOMY    ? EYE SURGERY  1997  ? remove foreign body; right/right eye is blurry  ? VIDEO ASSISTED THORACOSCOPY (VATS)/WEDGE RESECTION Right 03/17/2014  ? Procedure: VIDEO ASSISTED THORACOSCOPY (VATS)/WEDGE RESECTION;  Surgeon: Melrose Nakayama, MD;  Location: [place];  Service: Thoracic;  Laterality: Right;  ? ? ?Current Medications: ?Current Meds  ?  Medication Sig  ? acetaminophen (TYLENOL) 500 MG tablet Take 1,000 mg by mouth every 6 (six) hours as needed for mild pain.  ? amLODipine (NORVASC) 5 MG tablet Take 1 tablet (5 mg total) by mouth daily.  ? aspirin 81 MG tablet Take 81 mg by mouth daily.  ? carboxymethylcellulose (REFRESH PLUS) 0.5 % SOLN Place 1 drop into both eyes daily as needed (dry eyes).  ? Coenzyme Q10 200 MG capsule Take 200 mg by mouth daily.  ? ibuprofen (ADVIL) 200 MG tablet Take 400 mg by mouth every 8 (eight) hours as needed for mild pain.  ? lisinopril-hydrochlorothiazide (ZESTORETIC)  20-12.5 MG tablet Take 2 tablets by mouth daily.  ? nitroGLYCERIN (NITROSTAT) 0.4 MG SL tablet Place 1 tablet (0.4 mg total) under the tongue every 5 (five) minutes as needed. (Patient taking differently: Place 0.4 mg under the tongue every 5 (five) minutes as needed for chest pain.)  ? Probiotic Product (PROBIOTIC DAILY PO) Take 1 capsule by mouth daily. Reported on 01/15/2016  ?  ? ?Allergies:   Beta adrenergic blockers and Codeine  ? ?Social History  ? ?Socioeconomic History  ? Marital status: Married  ?  Spouse name: Not on file  ? Number of children: Not on file  ? Years of education: Not on file  ? Highest education level: Not on file  ?Occupational History  ? Not on file  ?Tobacco Use  ? Smoking status: Former  ?  Packs/day: 1.00  ?  Years: 30.00  ?  Pack years: 30.00  ?  Types: Cigarettes  ?  Start date: 03/08/1962  ?  Quit date: 03/08/1992  ?  Years since quitting: 29.9  ? Smokeless tobacco: Former  ?  Types: Snuff, Chew  ?  Quit date: 01/06/2013  ?Vaping Use  ? Vaping Use: Never used  ?Substance and Sexual Activity  ? Alcohol use: No  ? Drug use: No  ? Sexual activity: Not Currently  ?Other Topics Concern  ? Not on file  ?Social History Narrative  ? Not on file  ? ?Social Determinants of Health  ? ?Financial Resource Strain: Not on file  ?Food Insecurity: Not on file  ?Transportation Needs: Not on file  ?Physical Activity: Not on file  ?Stress: Not on file  ?Social Connections: Not on file  ?  ? ?Family History: ?The patient's family history includes Colon cancer (age of onset: 55) in his sister; Lung cancer in his mother. There is no history of Colon polyps, Esophageal cancer, Rectal cancer, or Stomach cancer. ? ?ROS:   ?Please see the history of present illness.    ? All other systems reviewed and are negative. ? ?EKGs/Labs/Other Studies Reviewed:   ? ?The following studies were reviewed today: ? ?Echo 04/12/2019 ?IMPRESSIONS ?  ?  ? 1. The left ventricle has normal systolic function with an ejection fraction of  60-65%. The cavity size was normal. Mild basal septal hypertrophy. Left ventricular diastolic parameters were normal. No evidence of left ventricular regional wall motion  ?abnormalities. ? 2. The right ventricle has normal systolic function. The cavity was normal. There is no increase in right ventricular wall thickness. ? 3. Mild posterior leaflet mitral valve prolapse. ? 4. The mitral valve is myxomatous. Mild thickening of the mitral valve leaflet. Mitral valve regurgitation is mild to moderate by color flow Doppler. The MR jet is eccentric anteriorly directed. ? 5. The aortic valve is tricuspid. Mild sclerosis of the aortic valve. Aortic valve regurgitation is mild to  moderate by color flow Doppler. ? 6. There is mild dilatation of the ascending aorta measuring 41 mm. ? ? ?CT of chest 06/01/2019 ?IMPRESSION: ?1. Minimal increase in size of ascending thoracic aortic aneurysm ?which currently measures 4.5 cm in diameter (previously 4.4 cm). ?Ascending thoracic aortic aneurysm. Recommend semi-annual imaging ?followup by CTA or MRA and referral to cardiothoracic surgery if not ?already obtained. This recommendation follows 2010 ?ACCF/AHA/AATS/ACR/ASA/SCA/SCAI/SIR/STS/SVM Guidelines for the ?Diagnosis and Management of Patients With Thoracic Aortic Disease. ?Circulation. 2010; 121: M353-I144. Aortic aneurysm NOS ?(ICD10-I71.9). ?2. Previously noted left upper lobe pulmonary nodule is stable ?dating back to 2017, considered benign. However, there is a new 4 mm ?right upper lobe pulmonary nodule (axial image 77 of series 8). This ?is nonspecific, but attention at time of follow-up examination is ?recommended to ensure stability of this finding. ?3. Mild diffuse bronchial wall thickening with mild centrilobular ?and paraseptal emphysema; imaging findings suggestive of underlying ?COPD. ?4. Aortic atherosclerosis, in addition to left main and 2 vessel ?coronary artery disease. Please note that although the presence  of ?coronary artery calcium documents the presence of coronary artery ?disease, the severity of this disease and any potential stenosis ?cannot be assessed on this non-gated CT examination. Assessment for ?pot

## 2022-02-27 ENCOUNTER — Ambulatory Visit: Payer: Medicare HMO | Admitting: Cardiology

## 2022-02-27 ENCOUNTER — Encounter: Payer: Self-pay | Admitting: Cardiology

## 2022-02-27 VITALS — BP 120/60 | HR 64 | Ht 69.0 in | Wt 149.2 lb

## 2022-02-27 DIAGNOSIS — I2584 Coronary atherosclerosis due to calcified coronary lesion: Secondary | ICD-10-CM

## 2022-02-27 DIAGNOSIS — I493 Ventricular premature depolarization: Secondary | ICD-10-CM

## 2022-02-27 DIAGNOSIS — I7121 Aneurysm of the ascending aorta, without rupture: Secondary | ICD-10-CM

## 2022-02-27 DIAGNOSIS — I2510 Atherosclerotic heart disease of native coronary artery without angina pectoris: Secondary | ICD-10-CM

## 2022-06-18 DIAGNOSIS — I2510 Atherosclerotic heart disease of native coronary artery without angina pectoris: Secondary | ICD-10-CM | POA: Diagnosis not present

## 2022-06-18 DIAGNOSIS — J439 Emphysema, unspecified: Secondary | ICD-10-CM | POA: Diagnosis not present

## 2022-06-18 DIAGNOSIS — I700 Atherosclerosis of aorta: Secondary | ICD-10-CM | POA: Diagnosis not present

## 2022-06-18 DIAGNOSIS — I10 Essential (primary) hypertension: Secondary | ICD-10-CM | POA: Diagnosis not present

## 2022-06-18 DIAGNOSIS — I712 Thoracic aortic aneurysm, without rupture, unspecified: Secondary | ICD-10-CM | POA: Diagnosis not present

## 2022-06-24 ENCOUNTER — Ambulatory Visit (HOSPITAL_BASED_OUTPATIENT_CLINIC_OR_DEPARTMENT_OTHER)
Admission: RE | Admit: 2022-06-24 | Discharge: 2022-06-24 | Disposition: A | Discharge status: Home / Self Care | Payer: Medicare HMO | Source: Ambulatory Visit | Attending: Pulmonary Disease | Admitting: Pulmonary Disease

## 2022-06-24 DIAGNOSIS — R918 Other nonspecific abnormal finding of lung field: Secondary | ICD-10-CM | POA: Diagnosis not present

## 2022-06-24 DIAGNOSIS — R911 Solitary pulmonary nodule: Secondary | ICD-10-CM

## 2022-07-31 ENCOUNTER — Encounter: Payer: Self-pay | Admitting: Pulmonary Disease

## 2022-07-31 ENCOUNTER — Ambulatory Visit: Payer: Medicare HMO | Admitting: Pulmonary Disease

## 2022-07-31 DIAGNOSIS — R911 Solitary pulmonary nodule: Secondary | ICD-10-CM

## 2022-07-31 DIAGNOSIS — I7121 Aneurysm of the ascending aorta, without rupture: Secondary | ICD-10-CM | POA: Diagnosis not present

## 2022-07-31 NOTE — Patient Instructions (Signed)
CTA aorta in 1 year

## 2022-07-31 NOTE — Progress Notes (Signed)
   Subjective:    Patient ID: Wayne Rodriguez, male    DOB: 05/27/1946, 76 y.o.   MRN: [id number]  HPI  76 yo remote smoker for FU of pulmonary nodules, palpitation and shortness of breath   PMH -VATS biopsy right upper lobe nodule -benign, iron oxide pigment, Katzenstein review ? exposure to welding or other occupational exposure.  He worked as a Engineer, building services -Hypertension, heart murmur -Thoracic aortic aneurysm 4.2 cm 12/2021  40-monthfollow-up visit. Arrives accompanied by his daughter. No cough, shortness of breath, fevers or night sweats Reviewed cardiology visit 02/2022.  He still has occasional palpitations, no worse. Occasional cough when he comes in from outside -medication review shows lisinopril   Significant tests/ events reviewed  14-day event monitor 01/2022 -few runs of SVT   06/2022 CT chest >> stable LUL nodule & RL cluster , decreased lingula cluster, stable TAA  CT chest 11/2021 8 x 4 mm left upper lobe pulmonary nodule +cluster of nodules within the right medial anterior upper lobe    CTA chest 12/2021 >>Stable ascending thoracic aorta aneurysm (4.2 cm).  Stable above nodules + new cluster of nodules in lingula CTchest 11/2020 - stable LUL nodule dating back to 2017   03/2014 PFTs ratio 67, FEV1 91%, FVC 101%, TLC 108%, DLCO 84%   Review of Systems neg for any significant sore throat, dysphagia, itching, sneezing, nasal congestion or excess/ purulent secretions, fever, chills, sweats, unintended wt loss, pleuritic or exertional cp, hempoptysis, orthopnea pnd or change in chronic leg swelling. Also denies presyncope, palpitations, heartburn, abdominal pain, nausea, vomiting, diarrhea or change in bowel or urinary habits, dysuria,hematuria, rash, arthralgias, visual complaints, headache, numbness weakness or ataxia.     Objective:   Physical Exam  Gen. Pleasant, well-nourished, in no distress ENT - no thrush, no pallor/icterus,no post nasal drip Neck: No JVD,  no thyromegaly, no carotid bruits Lungs: no use of accessory muscles, no dullness to percussion, clear without rales or rhonchi  Cardiovascular: Rhythm regular, heart sounds  normal, no murmurs or gallops, no peripheral edema Musculoskeletal: No deformities, no cyanosis or clubbing        Assessment & Plan:

## 2022-07-31 NOTE — Assessment & Plan Note (Signed)
4.2 cm but stable. Repeat CT aortogram in 1 year

## 2022-07-31 NOTE — Assessment & Plan Note (Signed)
Left upper lobe nodule is longstanding and appears benign, unchanged. Cluster of nodules in the lingula has improved and persists in the right upper lobe, doubt mycobacterial infection. He has no systemic symptoms. We will follow-up with CT chest in 1 year

## 2022-09-25 DIAGNOSIS — M9901 Segmental and somatic dysfunction of cervical region: Secondary | ICD-10-CM | POA: Diagnosis not present

## 2022-09-25 DIAGNOSIS — M5031 Other cervical disc degeneration,  high cervical region: Secondary | ICD-10-CM | POA: Diagnosis not present

## 2022-09-30 DIAGNOSIS — M5031 Other cervical disc degeneration,  high cervical region: Secondary | ICD-10-CM | POA: Diagnosis not present

## 2022-09-30 DIAGNOSIS — M9901 Segmental and somatic dysfunction of cervical region: Secondary | ICD-10-CM | POA: Diagnosis not present

## 2022-10-07 DIAGNOSIS — M9901 Segmental and somatic dysfunction of cervical region: Secondary | ICD-10-CM | POA: Diagnosis not present

## 2022-10-07 DIAGNOSIS — M5031 Other cervical disc degeneration,  high cervical region: Secondary | ICD-10-CM | POA: Diagnosis not present

## 2022-10-10 DIAGNOSIS — M5031 Other cervical disc degeneration,  high cervical region: Secondary | ICD-10-CM | POA: Diagnosis not present

## 2022-10-10 DIAGNOSIS — M9901 Segmental and somatic dysfunction of cervical region: Secondary | ICD-10-CM | POA: Diagnosis not present

## 2022-10-11 ENCOUNTER — Other Ambulatory Visit: Payer: Self-pay | Admitting: Thoracic Surgery (Cardiothoracic Vascular Surgery)

## 2022-10-11 DIAGNOSIS — I712 Thoracic aortic aneurysm, without rupture, unspecified: Secondary | ICD-10-CM

## 2022-10-11 DIAGNOSIS — R1012 Left upper quadrant pain: Secondary | ICD-10-CM | POA: Diagnosis not present

## 2022-10-11 DIAGNOSIS — I10 Essential (primary) hypertension: Secondary | ICD-10-CM | POA: Diagnosis not present

## 2022-10-15 ENCOUNTER — Telehealth: Payer: Self-pay | Admitting: Cardiology

## 2022-10-15 ENCOUNTER — Other Ambulatory Visit: Payer: Self-pay

## 2022-10-15 DIAGNOSIS — M9901 Segmental and somatic dysfunction of cervical region: Secondary | ICD-10-CM | POA: Diagnosis not present

## 2022-10-15 DIAGNOSIS — M5031 Other cervical disc degeneration,  high cervical region: Secondary | ICD-10-CM | POA: Diagnosis not present

## 2022-10-15 MED ORDER — AMLODIPINE BESYLATE 5 MG PO TABS: ORAL_TABLET | ORAL | 2 refills | Status: DC

## 2022-10-15 NOTE — Telephone Encounter (Signed)
Pt c/o BP issue: STAT if pt c/o blurred vision, one-sided weakness or slurred speech  1. What are your last 5 BP readings?  160/?? 174/?? 156/?? 160/??  2. Are you having any other symptoms (ex. Dizziness, headache, blurred vision, passed out)?  Left shoulder pain due to a pinched nerve--PCP prescribed Prednisone and Prilosec  3. What is your BP issue? Patient states his systolic has been elevated recently

## 2022-10-15 NOTE — Telephone Encounter (Signed)
Called patient, he states that he has been having issues with his BP readings. He last was seen in office 02/2022 by Dr.Jordan at this time HTN was controlled. Since this visit patient has a pinched nerve in his shoulder, he states the last 4 months it has been there, but it has worsened. We did discuss pain can increase the blood pressure readings, but patient also states he recently seen PCP on Friday and they placed him on a 10 day prednisone taper pack/ muscle relaxer, Prilosec. He started this yesterday (prednisone), when he was at the visit his BP was 160/? (He did not know diastolic number) he did state his bp at home since then has been the following:  11:30 AM today- 151/68 HR 64 12:30 PM today- 160/73 HR 72 2:00 PM today- 156/75 HR 65.   He has dizziness when standing, and mentions palpitations. We did discuss presnidone can increase blood pressure readings some as well. Patient is currently taking amlodipine, hctz mix (med list is current).   I advised I would route to MD to review and give recommendations. Patient is just concerned with the readings he has been getting as they have been steadily increasing.

## 2022-10-15 NOTE — Telephone Encounter (Signed)
Called patient, advised of message below from MD.   Updated RX.   Patient verbalized understanding will monitor BP at home and keep [place] updated.

## 2022-10-17 DIAGNOSIS — H2512 Age-related nuclear cataract, left eye: Secondary | ICD-10-CM | POA: Diagnosis not present

## 2022-10-17 DIAGNOSIS — S0591XS Unspecified injury of right eye and orbit, sequela: Secondary | ICD-10-CM | POA: Diagnosis not present

## 2022-10-17 DIAGNOSIS — H5203 Hypermetropia, bilateral: Secondary | ICD-10-CM | POA: Diagnosis not present

## 2022-10-18 NOTE — Telephone Encounter (Signed)
Patient stated that after eating a light breakfast and a light lunch, he had strong palpitations that lasted about 30 minutes. When he eats a heavy meal, palpitations last longer. Associated with palpitations are light headedness, SOB, and weakness. BP while on phone 148/101, P 71. Please advise.

## 2022-10-18 NOTE — Telephone Encounter (Signed)
Spoke to patient he stated he started having severe palpitations last night.No chest pain.Stated he has had a few palpitations today off and on.None at present.B/P 148/101.Pulse 75.Appointment scheduled with Laurann Montana NP Monday 12/18 at 11:20 am at [facility] location.I will make Dr.Jordan aware.

## 2022-10-18 NOTE — Telephone Encounter (Signed)
Patient called back to say his bp is still elevated. Please advise

## 2022-10-21 ENCOUNTER — Ambulatory Visit (HOSPITAL_BASED_OUTPATIENT_CLINIC_OR_DEPARTMENT_OTHER): Payer: Medicare HMO | Admitting: Family

## 2022-10-21 ENCOUNTER — Encounter (HOSPITAL_BASED_OUTPATIENT_CLINIC_OR_DEPARTMENT_OTHER): Payer: Self-pay | Admitting: Family

## 2022-10-21 VITALS — BP 148/62 | HR 61 | Ht 69.0 in | Wt 143.0 lb

## 2022-10-21 DIAGNOSIS — I493 Ventricular premature depolarization: Secondary | ICD-10-CM

## 2022-10-21 DIAGNOSIS — I10 Essential (primary) hypertension: Secondary | ICD-10-CM | POA: Diagnosis not present

## 2022-10-21 DIAGNOSIS — R002 Palpitations: Secondary | ICD-10-CM | POA: Diagnosis not present

## 2022-10-21 DIAGNOSIS — I7121 Aneurysm of the ascending aorta, without rupture: Secondary | ICD-10-CM

## 2022-10-21 DIAGNOSIS — E785 Hyperlipidemia, unspecified: Secondary | ICD-10-CM

## 2022-10-21 DIAGNOSIS — I25118 Atherosclerotic heart disease of native coronary artery with other forms of angina pectoris: Secondary | ICD-10-CM | POA: Diagnosis not present

## 2022-10-21 MED ORDER — AMLODIPINE BESYLATE 5 MG PO TABS: 10.0000 mg | ORAL_TABLET | Freq: Every day | ORAL | 1 refills | Status: DC

## 2022-10-21 MED ORDER — PROPRANOLOL HCL 10 MG PO TABS: 10.0000 mg | ORAL_TABLET | Freq: Two times a day (BID) | ORAL | 2 refills | Status: DC | PRN

## 2022-10-21 NOTE — Patient Instructions (Signed)
Medication Instructions:  Your physician has recommended you make the following change in your medication:   INCREASE Amlodipine to '10mg'$  daily (take 2 of your '5mg'$  together)  START Propranolol '10mg'$  as needed for palpitations up to twice daily   *If you need a refill on your cardiac medications before your next appointment, please call your pharmacy*   Lab Work/Testing/Procedures: Your EKG today shows normal sinus rhythm which is a good result!  Follow-Up: At [facility], you and your health needs are our priority.  As part of our continuing mission to provide you with exceptional heart care, we have created designated Provider Care Teams.  These Care Teams include your primary Cardiologist (physician) and Advanced Practice Providers (APPs -  Physician Assistants and Nurse Practitioners) who all work together to provide you with the care you need, when you need it.  We recommend signing up for the patient portal called "MyChart".  Sign up information is provided on this After Visit Summary.  MyChart is used to connect with patients for Virtual Visits (Telemedicine).  Patients are able to view lab/test results, encounter notes, upcoming appointments, etc.  Non-urgent messages can be sent to your provider as well.   To learn more about what you can do with MyChart, go to [email or web address].    Your next appointment:   3 month(s)  The format for your next appointment:   In Person  Provider:   Peter Martinique, MD  or Advanced Practice Provider   Other Instructions  Your elevated blood pressure is likely a combination of pain, prednisone, and NSAID (ibuprofen) use.  We will call you in one week to check in after medication changes.  If blood pressure is not at goal, we will consider changing your Lisinopril-HCTZ.  To prevent palpitations: Make sure you are adequately hydrated.  Avoid and/or limit caffeine containing beverages like soda or tea. Exercise regularly.  Manage  stress well. Some over the counter medications can cause palpitations such as Benadryl, AdvilPM, TylenolPM. Regular Advil or Tylenol do not cause palpitations.    Tips to Measure your Blood Pressure Correctly  Here's what you can do to ensure a correct reading:  Don't drink a caffeinated beverage or smoke during the 30 minutes before the test.  Sit quietly for five minutes before the test begins.  During the measurement, sit in a chair with your feet on the floor and your arm supported so your elbow is at about heart level.  The inflatable part of the cuff should completely cover at least 80% of your upper arm, and the cuff should be placed on bare skin, not over a shirt.  Don't talk during the measurement.   Blood pressure categories  Blood pressure category SYSTOLIC (upper number)  DIASTOLIC (lower number)  Normal Less than 120 mm Hg and Less than 80 mm Hg  Elevated 120-129 mm Hg and Less than 80 mm Hg  High blood pressure: Stage 1 hypertension 130-139 mm Hg or 80-89 mm Hg  High blood pressure: Stage 2 hypertension 140 mm Hg or higher or 90 mm Hg or higher  Hypertensive crisis (consult your doctor immediately) Higher than 180 mm Hg and/or Higher than 120 mm Hg  Source: American Heart Association and American Stroke Association. For more on getting your blood pressure under control, buy Controlling Your Blood Pressure, a Special Health Report from [facility].   Blood Pressure Log   Date   Time  Blood Pressure  Example: Nov 1 9 AM 124/78

## 2022-10-21 NOTE — Progress Notes (Unsigned)
Office Visit    Patient Name: Wayne Rodriguez Date of Encounter: 10/21/2022  PCP:  Orpah Melter, MD   [facility]  Cardiologist:  Peter Martinique, MD  Advanced Practice Provider:  No care team member to display Electrophysiologist:  None      Chief Complaint    Wayne Rodriguez is a 76 y.o. male presents today for palpitations   Past Medical History    Past Medical History:  Diagnosis Date   Aortic root dilatation ([place])    a. 09/2010 CT: 4.1cm Max Asc Ao diam, 3.7cm Max Desc Ao diam;; s/p MRI/MRA in April 2013   Back pain    Cancer [facility])    skin cancer removed from face   Chest pain    a. 02/2010 Cath: normal   Decreased exercise tolerance    a. 02/2012   GERD (gastroesophageal reflux disease)    HTN (hypertension)    Mitral valve prolapse    Palpitations    a. h/o palps r/t caffeine - has occurred again 02/2012   Pulmonary nodule    3 nodules removed over 3 years by Dr Roxan Hockey   PVC's (premature ventricular contractions) 07/17/2015   Vertigo    hx of   Past Surgical History:  Procedure Laterality Date   [place]     no PCI   COLONOSCOPY     COLONOSCOPY W/ [place]   remove foreign body; right/right eye is blurry   VIDEO ASSISTED THORACOSCOPY (VATS)/WEDGE RESECTION Right 03/17/2014   Procedure: VIDEO ASSISTED THORACOSCOPY (VATS)/WEDGE RESECTION;  Surgeon: Melrose Nakayama, MD;  Location: [facility];  Service: Thoracic;  Laterality: Right;    Allergies  Allergies  Allergen Reactions   Beta Adrenergic Blockers     Bradycardia  Other reaction(s): bradycardia   Codeine     REACTION: nausea/vomiting    History of Present Illness    Wayne Rodriguez is a 76 y.o. male with a hx of palpitations, PVC, aortic root dilation, hypertension, coronary artery calcification, mitral valve prolapse, lung nodule last seen 02/27/22.  Prior cardiac catheterization 02/2010 with right  dominant system with mild irregularities with no significant stenosis.  Echo 04/2019 LVEF 60 to 65%, mild basal septal hypertrophy, mild posterior leaflet mitral valve prolapse, myxomatous mitral valve with mild thickening of leaflet, mild to moderate MR, mild dilation of ascending aorta 41 mm.  CT chest 05/2019 ascending thoracic aortic aneurysm 4.5 cm.  Saw Dr. Gorden Harms in July 2020 with amlodipine 5 mg added to help with blood pressure control.  CT January 2021 unchanged.  ED visit 07/2020 atypical chest pain with workup unremarkable.  Admitted 12/2021 with dyspnea treated for pneumonia with amoxicillin.  Event monitor ordered due to PVCs which showed no significant arrhythmias and occasional PVC.Marland Kitchen  Echo 01/2022 normal LVEF, trivial MR, mild AI.  Tells me a tree fall on his back in the summer with ED evaluation unremarkable. 2 months ago started having pain in his left shoulder. Chiropractor noted pinched nerve in his neck. Since the pain started his blood pressure has been elevated. Then noted a recurrent episodes of palpitations. Notes he gets more palpitations with caffeine so has been drinking decaf. When his blood pressure is high feels  a pressure in the back of his head as well as lightheadedness. BP often in 150s with cuff that has previously been checked for accuracy. Had previously been taking lots  of Advil and PCP started PPI for protection.  Finished prednisone taper yesterday evening. Tells me his neck does not feel as stiff. Discussed that NSAIDS and prednisone could affect blood pressure.   EKGs/Labs/Other Studies Reviewed:   The following studies were reviewed today:   EKG:  EKG is ordered today.  The ekg ordered today demonstrates NSR 612 bpm with first degree AV block PR 232 ms.   Recent Labs: 01/01/2022: B Natriuretic Peptide 46.6; Hemoglobin 14.4; Platelets 160; TSH 1.144 01/02/2022: BUN 16; Creatinine, Ser 0.93; Potassium 3.8; Sodium 137  Recent Lipid Panel    Component Value  Date/Time   CHOL 127 09/29/2017 1009   TRIG 78 09/29/2017 1009   HDL 59 09/29/2017 1009   CHOLHDL 3 10/19/2012 0844   VLDL 22.8 10/19/2012 0844   LDLCALC 52 09/29/2017 1009   Home Medications   Current Meds  Medication Sig   acetaminophen (TYLENOL) 500 MG tablet Take 1,000 mg by mouth every 6 (six) hours as needed for mild pain.   amLODipine (NORVASC) 5 MG tablet Take 1 tablet (5 mg) in the AM, and 0.5 tablet (2.5 mg) in the PM.   aspirin 81 MG tablet Take 81 mg by mouth daily.   atorvastatin (LIPITOR) 40 MG tablet Take 40 mg by mouth once a week.   Boswellia Serrata (BOSWELLIA PO) Take by mouth daily.   carboxymethylcellulose (REFRESH PLUS) 0.5 % SOLN Place 1 drop into both eyes daily as needed (dry eyes).   Coenzyme Q10 200 MG capsule Take 200 mg by mouth daily.   ibuprofen (ADVIL) 200 MG tablet Take 400 mg by mouth every 8 (eight) hours as needed for mild pain.   lisinopril-hydrochlorothiazide (ZESTORETIC) 20-12.5 MG tablet Take 2 tablets by mouth daily.   nitroGLYCERIN (NITROSTAT) 0.4 MG SL tablet Place 1 tablet (0.4 mg total) under the tongue every 5 (five) minutes as needed. (Patient taking differently: Place 0.4 mg under the tongue every 5 (five) minutes as needed for chest pain.)   Probiotic Product (PROBIOTIC DAILY PO) Take 1 capsule by mouth daily. Reported on 01/15/2016   TART CHERRY PO Take by mouth daily.   TURMERIC PO Take by mouth daily.     Review of Systems      All other systems reviewed and are otherwise negative except as noted above.  Physical Exam    VS:  BP (!) 166/74   Pulse 61   Ht '5\' 9"'$  (1.753 m)   Wt 143 lb (64.9 kg)   BMI 21.12 kg/m  , BMI Body mass index is 21.12 kg/m.  Wt Readings from Last 3 Encounters:  10/21/22 143 lb (64.9 kg)  07/31/22 142 lb 6.4 oz (64.6 kg)  02/27/22 149 lb 3.2 oz (67.7 kg)     GEN: Well nourished, well developed, in no acute distress. HEENT: normal. Neck: Supple, no JVD, carotid bruits, or masses. Cardiac: RRR, no  murmurs, rubs, or gallops. No clubbing, cyanosis, edema.  Radials/PT 2+ and equal bilaterally.  Respiratory:  Respirations regular and unlabored, clear to auscultation bilaterally. GI: Soft, nontender, nondistended. MS: No deformity or atrophy. Skin: Warm and dry, no rash. Neuro:  Strength and sensation are intact. Psych: Normal affect.  Assessment & Plan    CAD / HLD - Stable with no anginal symptoms. No indication for ischemic evaluation.  GDMT aspirin, atorvastatin, PRN nitroglycerin. Heart healthy diet and regular cardiovascular exercise encouraged.    Thoracic aortic aneurysm - Follows with Dr. Roxan Hockey of [facility]. 06/24/22 stable 4.2 ascending thoracic  aortic aneurysm. Optimize blood pressure control, as below. Avoid fluoroquinolones.  History of PVC - More recent palpitations. Likely due to prednisone taper. Given baseline bradycardia, hesitnat to use daily beta blocker. Will Rx Propranolol '10mg'$  PRN for palpitations. Encouraged to avoid caffeine, stay well hydrated, manage stress well.   MVP - Continue to monitor with periodic echocardiogram. Continue optimal blood pressure control.   Lung nodule - Stable by CT 06/24/22 not requiring further imaging.   HTN - BP not at goal <130/80. Likely elevated due to recent neck pain, NSAID use, prednisone taper. Increase Amlodipine to '10mg'$  QD. Continue present dose Lisinopril-HCTZ. Discussed to monitor BP at home at least 2 hours after medications and sitting for 5-10 minutes.         Disposition: Follow up in 3 month(s) with Peter Martinique, MD or APP.  Signed, Loel Dubonnet, NP 10/21/2022, 11:33 AM [place]

## 2022-10-22 DIAGNOSIS — M9901 Segmental and somatic dysfunction of cervical region: Secondary | ICD-10-CM | POA: Diagnosis not present

## 2022-10-22 DIAGNOSIS — M5031 Other cervical disc degeneration,  high cervical region: Secondary | ICD-10-CM | POA: Diagnosis not present

## 2022-10-23 ENCOUNTER — Encounter (HOSPITAL_BASED_OUTPATIENT_CLINIC_OR_DEPARTMENT_OTHER): Payer: Self-pay | Admitting: Family

## 2022-10-29 ENCOUNTER — Other Ambulatory Visit (HOSPITAL_BASED_OUTPATIENT_CLINIC_OR_DEPARTMENT_OTHER): Payer: Self-pay | Admitting: Family

## 2022-10-29 DIAGNOSIS — R002 Palpitations: Secondary | ICD-10-CM

## 2022-10-29 DIAGNOSIS — I10 Essential (primary) hypertension: Secondary | ICD-10-CM

## 2022-10-30 ENCOUNTER — Encounter (HOSPITAL_BASED_OUTPATIENT_CLINIC_OR_DEPARTMENT_OTHER): Payer: Self-pay

## 2022-10-30 DIAGNOSIS — I10 Essential (primary) hypertension: Secondary | ICD-10-CM

## 2022-10-30 NOTE — Telephone Encounter (Signed)
BP log as requested 

## 2022-11-01 DIAGNOSIS — I10 Essential (primary) hypertension: Secondary | ICD-10-CM | POA: Diagnosis not present

## 2022-11-01 DIAGNOSIS — R1012 Left upper quadrant pain: Secondary | ICD-10-CM | POA: Diagnosis not present

## 2022-11-01 MED ORDER — SPIRONOLACTONE 25 MG PO TABS: 12.5000 mg | ORAL_TABLET | Freq: Every day | ORAL | 2 refills | Status: DC

## 2022-11-12 DIAGNOSIS — M5031 Other cervical disc degeneration,  high cervical region: Secondary | ICD-10-CM | POA: Diagnosis not present

## 2022-11-12 DIAGNOSIS — M9901 Segmental and somatic dysfunction of cervical region: Secondary | ICD-10-CM | POA: Diagnosis not present

## 2022-11-28 ENCOUNTER — Ambulatory Visit
Admission: RE | Admit: 2022-11-28 | Discharge: 2022-11-28 | Disposition: A | Discharge status: Home / Self Care | Payer: Medicare HMO | Source: Ambulatory Visit | Attending: Thoracic Surgery (Cardiothoracic Vascular Surgery) | Admitting: Thoracic Surgery (Cardiothoracic Vascular Surgery)

## 2022-11-28 DIAGNOSIS — I712 Thoracic aortic aneurysm, without rupture, unspecified: Secondary | ICD-10-CM | POA: Diagnosis not present

## 2022-11-28 DIAGNOSIS — I359 Nonrheumatic aortic valve disorder, unspecified: Secondary | ICD-10-CM | POA: Diagnosis not present

## 2022-11-28 DIAGNOSIS — I779 Disorder of arteries and arterioles, unspecified: Secondary | ICD-10-CM | POA: Diagnosis not present

## 2022-11-28 DIAGNOSIS — J984 Other disorders of lung: Secondary | ICD-10-CM | POA: Diagnosis not present

## 2022-11-28 MED ORDER — IOPAMIDOL (ISOVUE-370) INJECTION 76%
75.0000 mL | Freq: Once | INTRAVENOUS | Status: AC | PRN
  Administered 2022-11-28: 75 mL via INTRAVENOUS

## 2022-12-02 NOTE — Progress Notes (Unsigned)
[address]       [place] [address]             [phone]   PCP is Orpah Melter, MD Referring Provider is Orpah Melter, MD  Chief Complaint:   HPI: This is a 77 year old male with a past medical history of skin cancer, hypertension, decreased exercise tolerance, MVP, inflammatory lung nodules, PVCs, GERD, remote tobacco abuse  who was found to have ascending thoracic aortic aneurysm. He denies chest pain, pressure, tightness, LE edema, or shortness of breath.  Past Medical History:  Diagnosis Date   Aortic root dilatation ([place])    a. 09/2010 CT: 4.1cm Max Asc Ao diam, 3.7cm Max Desc Ao diam;; s/p MRI/MRA in April 2013   Back pain    Cancer [facility])    skin cancer removed from face   Chest pain    a. 02/2010 Cath: normal   Decreased exercise tolerance    a. 02/2012   GERD (gastroesophageal reflux disease)    HTN (hypertension)    Mitral valve prolapse    Palpitations    a. h/o palps r/t caffeine - has occurred again 02/2012   Pulmonary nodule    3 nodules removed over 3 years by Dr Roxan Hockey   PVC's (premature ventricular contractions) 07/17/2015   Vertigo    hx of    Past Surgical History:  Procedure Laterality Date   [place]     no PCI   COLONOSCOPY     COLONOSCOPY W/ [place]   remove foreign body; right/right eye is blurry   VIDEO ASSISTED THORACOSCOPY (VATS)/WEDGE RESECTION Right 03/17/2014   Procedure: VIDEO ASSISTED THORACOSCOPY (VATS)/WEDGE RESECTION;  Surgeon: Melrose Nakayama, MD;  Location: [facility];  Service: Thoracic;  Laterality: Right;    Family History  Problem Relation Age of Onset   Lung cancer Mother    Colon cancer Sister 53   Colon polyps Neg Hx    Esophageal cancer Neg Hx    Rectal cancer Neg Hx    Stomach cancer Neg Hx     Social History Social History   Tobacco Use   Smoking status: Former    Packs/day: 1.00    Years: 30.00    Total  pack years: 30.00    Types: Cigarettes    Start date: 03/08/1962    Quit date: 03/08/1992    Years since quitting: 30.7    Passive exposure: Past   Smokeless tobacco: Former    Types: Snuff, Sarina Ser    Quit date: 01/06/2013  Vaping Use   Vaping Use: Never used  Substance Use Topics   Alcohol use: No   Drug use: No    Current Outpatient Medications  Medication Sig Dispense Refill   acetaminophen (TYLENOL) 500 MG tablet Take 1,000 mg by mouth every 6 (six) hours as needed for mild pain.     amLODipine (NORVASC) 5 MG tablet Take 2 tablets (10 mg total) by mouth daily. 180 tablet 1   aspirin 81 MG tablet Take 81 mg by mouth daily.     atorvastatin (LIPITOR) 40 MG tablet Take 40 mg by mouth once a week.     Boswellia Serrata (BOSWELLIA PO) Take by mouth daily.     carboxymethylcellulose (REFRESH PLUS) 0.5 % SOLN Place 1 drop into both eyes daily as needed (dry eyes).     Coenzyme Q10 200 MG capsule  Take 200 mg by mouth daily.     ibuprofen (ADVIL) 200 MG tablet Take 400 mg by mouth every 8 (eight) hours as needed for mild pain.     lisinopril-hydrochlorothiazide (ZESTORETIC) 20-12.5 MG tablet Take 2 tablets by mouth daily. 180 tablet 3   nitroGLYCERIN (NITROSTAT) 0.4 MG SL tablet Place 1 tablet (0.4 mg total) under the tongue every 5 (five) minutes as needed. (Patient taking differently: Place 0.4 mg under the tongue every 5 (five) minutes as needed for chest pain.) 25 tablet 11   Probiotic Product (PROBIOTIC DAILY PO) Take 1 capsule by mouth daily. Reported on 01/15/2016     propranolol (INDERAL) 10 MG tablet Take 1 tablet (10 mg total) by mouth 2 (two) times daily as needed. 30 tablet 2   spironolactone (ALDACTONE) 25 MG tablet Take 0.5 tablets (12.5 mg total) by mouth daily. 15 tablet 2   TART CHERRY PO Take by mouth daily.     TURMERIC PO Take by mouth daily.      Allergies  Allergen Reactions   Beta Adrenergic Blockers     Bradycardia  Other reaction(s): bradycardia   Codeine      REACTION: nausea/vomiting    Review of Systems:  Chest Pain [  ] Resting SOB '[ ]'$  Exertional SOB [  ]  Pedal Edema [  ] Syncope [  ] Presyncope [  ]  General Review of Systems: [Y] = yes [ N]=no  Consitutional:   nausea '[ ]'$ ;  fever '[ ]'$ ;  Eye : blurred vision '[ ]'$ ; Amaurosis fugax[  ];  Resp: cough '[ ]'$ ;  hemoptysis'[ ]'$ ;  GI: vomiting'[ ]'$ ; melena'[ ]'$ ; hematochezia '[]'$ ;  GU:hematuria'[ ]'$ ; Musculoskeletal: myalgias'[ ]'$ ; joint swelling[  ]; joint erythema'[ ]'$ ;  Heme/Lymph: anemia'[ ]'$ ;  Neuro: TIA'[ ]'$ ;stroke'[ ]'$ ;  seizures'[ ]'$ ;  Endocrine: diabetes'[ ]'$ ;   Vital Signs:  Physical Exam: CV- Pulmonary- Neck- Abdomen- Extremities- Neurologic-  Diagnostic Tests:   Impression and Plan: Risk Modification in those with ascending thoracic aortic aneurysm:  Continue good control of blood pressure (prefer SBP 130/80 or less)-continue Amlodipine,  Lisinopril/HCTZ   2. Avoid fluoroquinolone antibiotics (I.e Ciprofloxacin, Avelox, Levofloxacin, Ofloxacin)  3.  Use of statin (to decrease cardiovascular risk)-continue Atorvastatin  4.  Exercise and activity limitations is individualized, but in general, contact sports are to be  avoided and one should avoid heavy lifting (defined as half of ideal body weight) and exercises involving sustained Valsalva maneuver.  5. Counseling for those suspected of having genetically mediated disease. First-degree relatives of those with TAA disease should be screened as well as those who have a connective tissue disease (I.e with Marfan syndrome, Ehlers-Danlos syndrome,  and Loeys-Dietz syndrome) or a  bicuspid aortic valve,have an increased risk for  complications related to TAA. Echo done 03/23 showed LVEF 65-70%, AV normal in structure, Mild AI and trivial MR  6. HE has a remote history of tobacco abuse     4/23, PA-C [facility] [phone]

## 2022-12-03 ENCOUNTER — Ambulatory Visit (INDEPENDENT_AMBULATORY_CARE_PROVIDER_SITE_OTHER): Payer: Medicare HMO | Admitting: Physician Assistant

## 2022-12-03 ENCOUNTER — Encounter: Payer: Self-pay | Admitting: Physician Assistant

## 2022-12-03 VITALS — BP 149/69 | HR 63 | Resp 20 | Ht 69.0 in | Wt 146.0 lb

## 2022-12-03 DIAGNOSIS — I7121 Aneurysm of the ascending aorta, without rupture: Secondary | ICD-10-CM

## 2022-12-03 NOTE — Patient Instructions (Addendum)
  Risk Modification in those with ascending thoracic aortic aneurysm:  Continue good control of blood pressure (prefer SBP 130/80 or less)-continue Amlodipine,  Lisinopril/HCTZ. Recommended heart healthy, low salt diet   2. Avoid fluoroquinolone antibiotics (I.e Ciprofloxacin, Avelox, Levofloxacin, Ofloxacin)  3.  Use of statin (to decrease cardiovascular risk)-continue Atorvastatin  4.  Exercise and activity limitations is individualized, but in general, contact sports are to be  avoided and one should avoid heavy lifting (defined as half of ideal body weight) and exercises involving sustained Valsalva maneuver.  5. Counseling for those suspected of having genetically mediated disease. First-degree relatives of those with TAA disease should be screened as well as those who have a connective tissue disease (I.e with Marfan syndrome, Ehlers-Danlos syndrome,  and Loeys-Dietz syndrome) or a  bicuspid aortic valve,have an increased risk for  complications related to TAA. Echo done 03/23 showed LVEF 65-70%, AV normal in structure, Mild AI and trivial MR  6. He has a remote history of tobacco abuse

## 2022-12-06 DIAGNOSIS — M5134 Other intervertebral disc degeneration, thoracic region: Secondary | ICD-10-CM | POA: Diagnosis not present

## 2022-12-06 DIAGNOSIS — M542 Cervicalgia: Secondary | ICD-10-CM | POA: Diagnosis not present

## 2022-12-19 DIAGNOSIS — I700 Atherosclerosis of aorta: Secondary | ICD-10-CM | POA: Diagnosis not present

## 2022-12-19 DIAGNOSIS — I712 Thoracic aortic aneurysm, without rupture, unspecified: Secondary | ICD-10-CM | POA: Diagnosis not present

## 2022-12-19 DIAGNOSIS — J439 Emphysema, unspecified: Secondary | ICD-10-CM | POA: Diagnosis not present

## 2022-12-19 DIAGNOSIS — D696 Thrombocytopenia, unspecified: Secondary | ICD-10-CM | POA: Diagnosis not present

## 2022-12-19 DIAGNOSIS — K219 Gastro-esophageal reflux disease without esophagitis: Secondary | ICD-10-CM | POA: Diagnosis not present

## 2022-12-19 DIAGNOSIS — I10 Essential (primary) hypertension: Secondary | ICD-10-CM | POA: Diagnosis not present

## 2022-12-19 DIAGNOSIS — I2510 Atherosclerotic heart disease of native coronary artery without angina pectoris: Secondary | ICD-10-CM | POA: Diagnosis not present

## 2023-01-20 ENCOUNTER — Encounter (HOSPITAL_BASED_OUTPATIENT_CLINIC_OR_DEPARTMENT_OTHER): Payer: Self-pay | Admitting: Family

## 2023-01-20 ENCOUNTER — Ambulatory Visit (HOSPITAL_BASED_OUTPATIENT_CLINIC_OR_DEPARTMENT_OTHER): Payer: Medicare HMO | Admitting: Family

## 2023-01-20 VITALS — BP 136/60 | HR 60 | Ht 69.0 in | Wt 144.0 lb

## 2023-01-20 DIAGNOSIS — E785 Hyperlipidemia, unspecified: Secondary | ICD-10-CM

## 2023-01-20 DIAGNOSIS — I25118 Atherosclerotic heart disease of native coronary artery with other forms of angina pectoris: Secondary | ICD-10-CM

## 2023-01-20 DIAGNOSIS — I10 Essential (primary) hypertension: Secondary | ICD-10-CM | POA: Diagnosis not present

## 2023-01-20 MED ORDER — AMLODIPINE BESYLATE 10 MG PO TABS: 10.0000 mg | ORAL_TABLET | Freq: Every day | ORAL | 3 refills | Status: AC

## 2023-01-20 MED ORDER — SPIRONOLACTONE 25 MG PO TABS: 12.5000 mg | ORAL_TABLET | Freq: Every day | ORAL | 3 refills | Status: AC

## 2023-01-20 NOTE — Patient Instructions (Signed)
Medication Instructions:  Your physician has recommended you make the following change in your medication:   Increase: Amlodipine 10mg  daily   Start: Spironolactone 12.5mg  daily   Lab Work: Please return for Lab work in 1-2 weeks for BMP. You may come to the...   [facility] (3rd floor) [address] [place], [place] [address]  Open: 8am-Noon and 1pm-4:30pm  Please ring the doorbell on the small table when you exit the elevator and the Lab Tech will come get you  [place] at [facility] [address] [place], [place], [place] [address] Open: 8am-1pm, then 2pm-4:30pm   [place]- Please see attached locations sheet stapled to your lab work with address and hours.   Follow-Up: At [facility], you and your health needs are our priority.  As part of our continuing mission to provide you with exceptional heart care, we have created designated Provider Care Teams.  These Care Teams include your primary Cardiologist (physician) and Advanced Practice Providers (APPs -  Physician Assistants and Nurse Practitioners) who all work together to provide you with the care you need, when you need it.  We recommend signing up for the patient portal called "MyChart".  Sign up information is provided on this After Visit Summary.  MyChart is used to connect with patients for Virtual Visits (Telemedicine).  Patients are able to view lab/test results, encounter notes, upcoming appointments, etc.  Non-urgent messages can be sent to your provider as well.   To learn more about what you can do with MyChart, go to [email or web address].    Your next appointment:   6 month(s)  Provider:   Peter Martinique, MD   Other Instructions Daphene Jaeger will send you a message in about 2 weeks to check on BP.

## 2023-01-20 NOTE — Progress Notes (Signed)
Office Visit    Patient Name: Wayne Rodriguez Date of Encounter: 01/20/2023  PCP:  Orpah Melter, MD   [facility]  Cardiologist:  Peter Martinique, MD  Advanced Practice Provider:  No care team member to display Electrophysiologist:  None      Chief Complaint    Wayne Rodriguez is a 77 y.o. male presents today for blood pressure follow up.   Past Medical History    Past Medical History:  Diagnosis Date   Aortic root dilatation ([place])    a. 09/2010 CT: 4.1cm Max Asc Ao diam, 3.7cm Max Desc Ao diam;; s/p MRI/MRA in April 2013   Back pain    Cancer [facility])    skin cancer removed from face   Chest pain    a. 02/2010 Cath: normal   Decreased exercise tolerance    a. 02/2012   GERD (gastroesophageal reflux disease)    HTN (hypertension)    Mitral valve prolapse    Palpitations    a. h/o palps r/t caffeine - has occurred again 02/2012   Pulmonary nodule    3 nodules removed over 3 years by Dr Roxan Hockey   PVC's (premature ventricular contractions) 07/17/2015   Vertigo    hx of   Past Surgical History:  Procedure Laterality Date   [place]     no PCI   COLONOSCOPY     COLONOSCOPY W/ [place]   remove foreign body; right/right eye is blurry   VIDEO ASSISTED THORACOSCOPY (VATS)/WEDGE RESECTION Right 03/17/2014   Procedure: VIDEO ASSISTED THORACOSCOPY (VATS)/WEDGE RESECTION;  Surgeon: Melrose Nakayama, MD;  Location: [place];  Service: Thoracic;  Laterality: Right;    Allergies  Allergies  Allergen Reactions   Beta Adrenergic Blockers     Bradycardia  Other reaction(s): bradycardia   Codeine     REACTION: nausea/vomiting    History of Present Illness    Wayne Rodriguez is a 77 y.o. male with a hx of palpitations, PVC, aortic root dilation, hypertension, coronary artery calcification, mitral valve prolapse, lung nodule last seen 02/27/22.  Prior cardiac catheterization 02/2010  with right dominant system with mild irregularities with no significant stenosis.  Echo 04/2019 LVEF 60 to 65%, mild basal septal hypertrophy, mild posterior leaflet mitral valve prolapse, myxomatous mitral valve with mild thickening of leaflet, mild to moderate MR, mild dilation of ascending aorta 41 mm.  CT chest 05/2019 ascending thoracic aortic aneurysm 4.5 cm.  Saw Dr. Gorden Harms in July 2020 with amlodipine 5 mg added to help with blood pressure control.  CT January 2021 unchanged.  ED visit 07/2020 atypical chest pain with workup unremarkable.  Admitted 12/2021 with dyspnea treated for pneumonia with amoxicillin.  Event monitor ordered due to PVCs which showed no significant arrhythmias and occasional PVC.Marland Kitchen  Echo 01/2022 normal LVEF, trivial MR, mild AI.  Last seen 10/21/22. He noted elevated BP associated shoulder pain for which he was following with chiropractor. His Amlodipine was increased to 10mg  QD. Propranolol provided PRN for palpitations.   He presents today for follow up independently. Enjoys working in his garden and on cars at home. Still having persistent left shoulder pain but it is imporved. Most notable when he is using his arms. He is following with orthopedics who discovered pinched nerve. He has been giving some exercises which has helped but not resolved completely. Has been checking BP intermittently at home. Has  been 128-135. He reports occasional palpitations - has not taken any Propranolol.   EKGs/Labs/Other Studies Reviewed:   The following studies were reviewed today:  Cardiac Studies & Procedures     STRESS TESTS  EXERCISE TOLERANCE TEST (ETT) 04/12/2015  Narrative  There was no ST segment deviation noted during stress.  Blood pressure demonstrated a hypertensive response to exercise.  Negative adequate GXT with the patient exercising to a peak HR 146 (96% APMHR) and workload of 8.5 mets. No chest pain or ECG changes of ischemia. Patient had an exaggerated BP response  to exercise and occasional isolated PVC's during Stage 2 of exercise.   ECHOCARDIOGRAM  ECHOCARDIOGRAM COMPLETE 01/02/2022  Narrative ECHOCARDIOGRAM REPORT    Patient Name:   Wayne Rodriguez Date of Exam: 01/02/2022 Medical Rec #:  [id number]        Height:       69.0 in Accession #:    [id number]       Weight:       162.7 lb Date of Birth:  15-Jun-1946        BSA:          1.892 m Patient Age:    66 years         BP:           135/64 mmHg Patient Gender: M                HR:           64 bpm. Exam Location:  Inpatient  Procedure: 2D Echo, Cardiac Doppler and Color Doppler  Indications:    R06.00 DYSPNEA  History:        Patient has prior history of Echocardiogram examinations, most recent 04/12/2019. Mitral Valve Prolapse, Signs/Symptoms:Dyspnea; Risk Factors:Hypertension. AO ROOT DILATION / PVC.  Sonographer:    Beryle Beams Referring Phys: [id number] [place] T TU  IMPRESSIONS   1. Left ventricular ejection fraction, by estimation, is 65 to 70%. The left ventricle has normal function. The left ventricle has no regional wall motion abnormalities. Left ventricular diastolic parameters were normal. 2. Right ventricular systolic function is normal. The right ventricular size is normal. There is normal pulmonary artery systolic pressure. 3. Right atrial size was mildly dilated. 4. The mitral valve is normal in structure. Trivial mitral valve regurgitation. No evidence of mitral stenosis. 5. The aortic valve is normal in structure. Aortic valve regurgitation is mild. No aortic stenosis is present.  FINDINGS Left Ventricle: Left ventricular ejection fraction, by estimation, is 65 to 70%. The left ventricle has normal function. The left ventricle has no regional wall motion abnormalities. The left ventricular internal cavity size was normal in size. There is no left ventricular hypertrophy. Left ventricular diastolic parameters were normal.  Right Ventricle: The right ventricular size is  normal. Right vetricular wall thickness was not well visualized. Right ventricular systolic function is normal. There is normal pulmonary artery systolic pressure. The tricuspid regurgitant velocity is 2.33 m/s, and with an assumed right atrial pressure of 3 mmHg, the estimated right ventricular systolic pressure is [id number] mmHg.  Left Atrium: Left atrial size was normal in size.  Right Atrium: Right atrial size was mildly dilated.  Pericardium: There is no evidence of pericardial effusion.  Mitral Valve: The mitral valve is normal in structure. Trivial mitral valve regurgitation. No evidence of mitral valve stenosis.  Tricuspid Valve: The tricuspid valve is normal in structure. Tricuspid valve regurgitation is trivial.  Aortic Valve: The aortic valve is normal in  structure. Aortic valve regurgitation is mild. Aortic regurgitation PHT measures 420 msec. No aortic stenosis is present. Aortic valve mean gradient measures 7.0 mmHg. Aortic valve peak gradient measures 12.5 mmHg. Aortic valve area, by VTI measures 2.23 cm.  Pulmonic Valve: The pulmonic valve was not well visualized. Pulmonic valve regurgitation is not visualized.  Aorta: The aortic root and ascending aorta are structurally normal, with no evidence of dilitation.  IAS/Shunts: The atrial septum is grossly normal.   LEFT VENTRICLE PLAX 2D LVIDd:         4.50 cm     Diastology LVIDs:         3.20 cm     LV e' medial:    9.68 cm/s LV PW:         1.00 cm     LV E/e' medial:  9.6 LV IVS:        0.90 cm     LV e' lateral:   13.60 cm/s LVOT diam:     2.00 cm     LV E/e' lateral: 6.8 LV SV:         89 LV SV Index:   47 LVOT Area:     3.14 cm  LV Volumes (MOD) LV vol d, MOD A2C: 89.4 ml LV vol d, MOD A4C: 90.1 ml LV vol s, MOD A2C: 30.6 ml LV vol s, MOD A4C: 20.7 ml LV SV MOD A2C:     58.8 ml LV SV MOD A4C:     90.1 ml LV SV MOD BP:      64.4 ml  RIGHT VENTRICLE             IVC RV S prime:     14.60 cm/s  IVC diam: 1.60  cm TAPSE (M-mode): 2.6 cm  LEFT ATRIUM             Index        RIGHT ATRIUM           Index LA diam:        3.30 cm 1.74 cm/m   RA Area:     18.60 cm LA Vol (A2C):   65.0 ml 34.35 ml/m  RA Volume:   53.10 ml  28.06 ml/m LA Vol (A4C):   38.2 ml 20.19 ml/m LA Biplane Vol: 51.7 ml 27.32 ml/m AORTIC VALVE                     PULMONIC VALVE AV Area (Vmax):    2.18 cm      PV Vmax:       0.69 m/s AV Area (Vmean):   2.00 cm      PV Vmean:      45.200 cm/s AV Area (VTI):     2.23 cm      PV VTI:        0.150 m AV Vmax:           177.00 cm/s   PV Peak grad:  1.9 mmHg AV Vmean:          123.000 cm/s  PV Mean grad:  1.0 mmHg AV VTI:            0.399 m AV Peak Grad:      12.5 mmHg AV Mean Grad:      7.0 mmHg LVOT Vmax:         123.00 cm/s LVOT Vmean:        78.400 cm/s LVOT VTI:  0.283 m LVOT/AV VTI ratio: 0.71 AI PHT:            420 msec  AORTA Ao Root diam: 3.00 cm  MITRAL VALVE               TRICUSPID VALVE MV Area (PHT): 3.27 cm    TR Peak grad:   21.7 mmHg MV Decel Time: 232 msec    TR Vmax:        233.00 cm/s MR Peak grad: 81.0 mmHg MR Mean grad: 55.0 mmHg    SHUNTS MR Vmax:      450.00 cm/s  Systemic VTI:  0.28 m MR Vmean:     346.0 cm/s   Systemic Diam: 2.00 cm MV E velocity: 93.00 cm/s MV A velocity: 50.70 cm/s MV E/A ratio:  1.83  Mertie Moores MD Electronically signed by Mertie Moores MD Signature Date/Time: 01/02/2022/11:15:49 AM    Final    MONITORS  LONG TERM MONITOR (3-14 DAYS) 01/29/2022  Narrative  Normal sinus rhythm  Single 4 beat run of NSVT  Few short runs of SVT - longest 11 beats.  Occastional PVCs   Patch Wear Time:  13 days and 11 hours (2023-03-06T19:53:01-499 to 2023-03-20T08:51:23-0400)  Patient had a min HR of 39 bpm, max HR of 174 bpm, and avg HR of 59 bpm. Predominant underlying rhythm was Sinus Rhythm. First Degree AV Block was present. 1 run of Ventricular Tachycardia occurred lasting 4 beats with a max rate of 174  bpm (avg 167 bpm). 11 Supraventricular Tachycardia runs occurred, the run with the fastest interval lasting 11 beats with a max rate of 132 bpm, the longest lasting 10 beats with an avg rate of 95 bpm. Some episodes of Supraventricular Tachycardia may be possible Atrial Tachycardia with variable block. Isolated SVEs were rare (<1.0%), SVE Couplets were rare (<1.0%), and SVE Triplets were rare (<1.0%). Isolated VEs were occasional (3.0%, 33984), VE Couplets were rare (<1.0%, 593), and VE Triplets were rare (<1.0%, 24). Ventricular Bigeminy and Trigeminy were present.            EKG:  EKG is not ordered today.  Recent Labs: No results found for requested labs within last 365 days.  Recent Lipid Panel    Component Value Date/Time   CHOL 127 09/29/2017 1009   TRIG 78 09/29/2017 1009   HDL 59 09/29/2017 1009   CHOLHDL 3 10/19/2012 0844   VLDL 22.8 10/19/2012 0844   LDLCALC 52 09/29/2017 1009   Home Medications   Current Meds  Medication Sig   acetaminophen (TYLENOL) 500 MG tablet Take 1,000 mg by mouth every 6 (six) hours as needed for mild pain.   amLODipine (NORVASC) 5 MG tablet Take 2 tablets (10 mg total) by mouth daily.   aspirin 81 MG tablet Take 81 mg by mouth daily.   atorvastatin (LIPITOR) 40 MG tablet Take 40 mg by mouth once a week.   Boswellia Serrata (BOSWELLIA PO) Take by mouth daily.   carboxymethylcellulose (REFRESH PLUS) 0.5 % SOLN Place 1 drop into both eyes daily as needed (dry eyes).   Coenzyme Q10 200 MG capsule Take 200 mg by mouth daily.   ibuprofen (ADVIL) 200 MG tablet Take 400 mg by mouth every 8 (eight) hours as needed for mild pain.   lisinopril-hydrochlorothiazide (ZESTORETIC) 20-12.5 MG tablet Take 2 tablets by mouth daily.   nitroGLYCERIN (NITROSTAT) 0.4 MG SL tablet Place 1 tablet (0.4 mg total) under the tongue every 5 (five) minutes as needed.  Probiotic Product (PROBIOTIC DAILY PO) Take 1 capsule by mouth daily. Reported on 01/15/2016   TART CHERRY PO  Take by mouth daily.   TURMERIC PO Take by mouth daily.     Review of Systems      All other systems reviewed and are otherwise negative except as noted above.  Physical Exam    VS:  BP 138/62   Pulse 60   Ht 5\' 9"  (1.753 m)   Wt 144 lb (65.3 kg)   BMI 21.27 kg/m  , BMI Body mass index is 21.27 kg/m.  Wt Readings from Last 3 Encounters:  01/20/23 144 lb (65.3 kg)  12/03/22 146 lb (66.2 kg)  10/21/22 143 lb (64.9 kg)    GEN: Well nourished, well developed, in no acute distress. HEENT: normal. Neck: Supple, no JVD, carotid bruits, or masses. Cardiac: RRR, no murmurs, rubs, or gallops. No clubbing, cyanosis, edema.  Radials/PT 2+ and equal bilaterally.  Respiratory:  Respirations regular and unlabored, clear to auscultation bilaterally. GI: Soft, nontender, nondistended. MS: No deformity or atrophy. Skin: Warm and dry, no rash. Neuro:  Strength and sensation are intact. Psych: Normal affect.  Assessment & Plan    CAD / HLD - Stable with no anginal symptoms. No indication for ischemic evaluation.  GDMT aspirin, atorvastatin, PRN nitroglycerin. Heart healthy diet and regular cardiovascular exercise encouraged.    Thoracic aortic aneurysm - Follows with Dr. Roxan Hockey of [facility]. 06/24/22 stable 4.2 ascending thoracic aortic aneurysm. Optimize blood pressure control, as below. Avoid fluoroquinolones.  History of PVC - Palpitations are rare. Continue. Propranolol 10mg  PRN for palpitations. Encouraged to avoid caffeine, stay well hydrated, manage stress well.   MVP - Continue to monitor with periodic echocardiogram. Continue optimal blood pressure control.   Lung nodule - Stable by CT 06/24/22 not requiring further imaging.   HTN - BP not at goal <130/80.Continue Amlodipine 10mg  QD, Lisinopril-HCTZ 40-25mg  QD. Start Spironolactone 12.5mg  QD with BMP in 1 week. Discussed to monitor BP at home at least 2 hours after medications and sitting for 5-10 minutes. Check in via MyChart in 2  weeks.     Disposition: Follow up in 3 month(s) with Peter Martinique, MD or APP.  Signed, Loel Dubonnet, NP 01/20/2023, 11:19 AM [place]

## 2023-01-31 DIAGNOSIS — E785 Hyperlipidemia, unspecified: Secondary | ICD-10-CM | POA: Diagnosis not present

## 2023-01-31 DIAGNOSIS — I25118 Atherosclerotic heart disease of native coronary artery with other forms of angina pectoris: Secondary | ICD-10-CM | POA: Diagnosis not present

## 2023-01-31 DIAGNOSIS — I10 Essential (primary) hypertension: Secondary | ICD-10-CM | POA: Diagnosis not present

## 2023-02-01 LAB — BASIC METABOLIC PANEL
BUN/Creatinine Ratio: 18 (ref 10–24)
BUN: 17 mg/dL (ref 8–27)
CO2: 24 mmol/L (ref 20–29)
Calcium: 9.8 mg/dL (ref 8.6–10.2)
Chloride: 101 mmol/L (ref 96–106)
Creatinine, Ser: 0.94 mg/dL (ref 0.76–1.27)
Glucose: 91 mg/dL (ref 70–99)
Potassium: 3.9 mmol/L (ref 3.5–5.2)
Sodium: 141 mmol/L (ref 134–144)
eGFR: 85 mL/min/{1.73_m2} (ref >59)

## 2023-02-03 ENCOUNTER — Encounter (HOSPITAL_BASED_OUTPATIENT_CLINIC_OR_DEPARTMENT_OTHER): Payer: Self-pay

## 2023-04-30 ENCOUNTER — Telehealth: Payer: Self-pay | Admitting: Pulmonary Disease

## 2023-05-06 ENCOUNTER — Other Ambulatory Visit: Payer: Self-pay

## 2023-05-06 DIAGNOSIS — R911 Solitary pulmonary nodule: Secondary | ICD-10-CM

## 2023-05-06 NOTE — Telephone Encounter (Signed)
Bmet has been ordered and LVM for patient.

## 2023-05-28 ENCOUNTER — Encounter (HOSPITAL_BASED_OUTPATIENT_CLINIC_OR_DEPARTMENT_OTHER): Payer: Self-pay

## 2023-05-28 ENCOUNTER — Other Ambulatory Visit: Payer: Self-pay

## 2023-05-28 ENCOUNTER — Emergency Department (HOSPITAL_BASED_OUTPATIENT_CLINIC_OR_DEPARTMENT_OTHER): Payer: Medicare HMO

## 2023-05-28 ENCOUNTER — Emergency Department (HOSPITAL_BASED_OUTPATIENT_CLINIC_OR_DEPARTMENT_OTHER)
Admission: EM | Admit: 2023-05-28 | Discharge: 2023-05-28 | Disposition: A | Discharge status: Home / Self Care | Payer: Medicare HMO | Attending: Emergency Medicine | Admitting: Emergency Medicine

## 2023-05-28 DIAGNOSIS — R42 Dizziness and giddiness: Secondary | ICD-10-CM | POA: Insufficient documentation

## 2023-05-28 DIAGNOSIS — R600 Localized edema: Secondary | ICD-10-CM | POA: Diagnosis not present

## 2023-05-28 DIAGNOSIS — I6782 Cerebral ischemia: Secondary | ICD-10-CM | POA: Diagnosis not present

## 2023-05-28 DIAGNOSIS — R7989 Other specified abnormal findings of blood chemistry: Secondary | ICD-10-CM | POA: Diagnosis not present

## 2023-05-28 DIAGNOSIS — M7989 Other specified soft tissue disorders: Secondary | ICD-10-CM | POA: Insufficient documentation

## 2023-05-28 DIAGNOSIS — J321 Chronic frontal sinusitis: Secondary | ICD-10-CM | POA: Diagnosis not present

## 2023-05-28 DIAGNOSIS — R29818 Other symptoms and signs involving the nervous system: Secondary | ICD-10-CM | POA: Diagnosis not present

## 2023-05-28 DIAGNOSIS — Z79899 Other long term (current) drug therapy: Secondary | ICD-10-CM | POA: Diagnosis not present

## 2023-05-28 DIAGNOSIS — Z7982 Long term (current) use of aspirin: Secondary | ICD-10-CM | POA: Diagnosis not present

## 2023-05-28 DIAGNOSIS — R220 Localized swelling, mass and lump, head: Secondary | ICD-10-CM | POA: Diagnosis not present

## 2023-05-28 LAB — COMPREHENSIVE METABOLIC PANEL
ALT: 16 U/L (ref 0–44)
AST: 19 U/L (ref 15–41)
Albumin: 3.9 g/dL (ref 3.5–5.0)
Alkaline Phosphatase: 53 U/L (ref 38–126)
Anion gap: 7 (ref 5–15)
BUN: 15 mg/dL (ref 8–23)
CO2: 29 mmol/L (ref 22–32)
Calcium: 9.1 mg/dL (ref 8.9–10.3)
Chloride: 100 mmol/L (ref 98–111)
Creatinine, Ser: 0.95 mg/dL (ref 0.61–1.24)
GFR, Estimated: >60 mL/min (ref >60)
Glucose, Bld: 92 mg/dL (ref 70–99)
Potassium: 3.5 mmol/L (ref 3.5–5.1)
Sodium: 136 mmol/L (ref 135–145)
Total Bilirubin: 0.8 mg/dL (ref 0.3–1.2)
Total Protein: 7 g/dL (ref 6.5–8.1)

## 2023-05-28 LAB — CBC WITH DIFFERENTIAL/PLATELET
Abs Immature Granulocytes: 0.01 10*3/uL (ref 0.00–0.07)
Basophils Absolute: 0 10*3/uL (ref 0.0–0.1)
Basophils Relative: 0 %
Eosinophils Absolute: 0.1 10*3/uL (ref 0.0–0.5)
Eosinophils Relative: 2 %
HCT: 44.2 % (ref 39.0–52.0)
Hemoglobin: 14.7 g/dL (ref 13.0–17.0)
Immature Granulocytes: 0 %
Lymphocytes Relative: 25 %
Lymphs Abs: 1.1 10*3/uL (ref 0.7–4.0)
MCH: 28.3 pg (ref 26.0–34.0)
MCHC: 33.3 g/dL (ref 30.0–36.0)
MCV: 85.2 fL (ref 80.0–100.0)
Monocytes Absolute: 0.4 10*3/uL (ref 0.1–1.0)
Monocytes Relative: 9 %
Neutro Abs: 2.9 10*3/uL (ref 1.7–7.7)
Neutrophils Relative %: 64 %
Platelets: 128 10*3/uL — ABNORMAL LOW (ref 150–400)
RBC: 5.19 MIL/uL (ref 4.22–5.81)
RDW: 13 % (ref 11.5–15.5)
WBC: 4.5 10*3/uL (ref 4.0–10.5)
nRBC: 0 % (ref 0.0–0.2)

## 2023-05-28 LAB — BRAIN NATRIURETIC PEPTIDE: B Natriuretic Peptide: 100.7 pg/mL — ABNORMAL HIGH (ref 0.0–100.0)

## 2023-05-28 NOTE — ED Provider Notes (Signed)
[facility] Provider Note   CSN: [id number] Arrival date & time: 05/28/23  1034     History  Chief Complaint  Patient presents with   Dizziness    Wayne Rodriguez is a 77 y.o. male.  Patient is normally very active.  Patient talks about persistent dizziness for the past 3 days that does not seem to be brought on by moving his head left or right.  Patient noticed this morning that he has some swelling to both legs this alarmed him and that is why he came in.  Patient's not really having any shortness of breath.  Patient is had a history of palpitations followed by [facility] [place] cardiology but he has not been able to pinpoint what that may be secondary to patient is not on any blood thinners.  Patient states that when he tries to walk he is offkilter.  Past medical history significant for chest pain hypertension gastroesophageal reflux disease aortic root dilatation.  History of palpitations has had a history of vertigo in the past but this seems to be different.  History of mitral valve prolapse.  Past history cardiac catheterization no stents or PCI.  Patient is a former tobacco user quit 1993.  Patient denies any upper extremity lower extremity weakness to his eyes and visual problems.  Denies any headache.  Any speech problems.       Home Medications Prior to Admission medications   Medication Sig Start Date End Date Taking? Authorizing Provider  acetaminophen (TYLENOL) 500 MG tablet Take 1,000 mg by mouth every 6 (six) hours as needed for mild pain.    [provider]  amLODipine (NORVASC) 10 MG tablet Take 1 tablet (10 mg total) by mouth daily. 01/20/23 01/15/24  Alver Sorrow, NP  aspirin 81 MG tablet Take 81 mg by mouth daily.    [provider]  atorvastatin (LIPITOR) 40 MG tablet Take 40 mg by mouth once a week. 06/18/22   [provider]  Boswellia Serrata (BOSWELLIA PO) Take by mouth daily.    [provider]  carboxymethylcellulose (REFRESH PLUS) 0.5 % SOLN Place 1 drop into both eyes daily as needed (dry eyes).    [provider]  Coenzyme Q10 200 MG capsule Take 200 mg by mouth daily.    [provider]  ibuprofen (ADVIL) 200 MG tablet Take 400 mg by mouth every 8 (eight) hours as needed for mild pain.    [provider]  lisinopril-hydrochlorothiazide (ZESTORETIC) 20-12.5 MG tablet Take 2 tablets by mouth daily. 11/18/19   Azalee Course, PA  nitroGLYCERIN (NITROSTAT) 0.4 MG SL tablet Place 1 tablet (0.4 mg total) under the tongue every 5 (five) minutes as needed. Patient taking differently: Place 0.4 mg under the tongue every 5 (five) minutes as needed for chest pain. 07/28/20   [place], Peter M, MD  pantoprazole (PROTONIX) 40 MG tablet Take 40 mg by mouth daily. 03/17/23   [provider]  Probiotic Product (PROBIOTIC DAILY PO) Take 1 capsule by mouth daily. Reported on 01/15/2016    [provider]  spironolactone (ALDACTONE) 25 MG tablet Take 0.5 tablets (12.5 mg total) by mouth daily. 01/20/23 01/15/24  Alver Sorrow, NP  TART CHERRY PO Take by mouth daily.    [provider]  TURMERIC PO Take by mouth daily.    [provider]      Allergies    Beta adrenergic blockers, Codeine, and Gabapentin  Review of Systems   Review of Systems  Constitutional:  Negative for chills and fever.  HENT:  Negative for ear pain and sore throat.   Eyes:  Negative for pain and visual disturbance.  Respiratory:  Negative for cough and shortness of breath.   Cardiovascular:  Positive for leg swelling. Negative for chest pain and palpitations.  Gastrointestinal:  Negative for abdominal pain and vomiting.  Genitourinary:  Negative for dysuria and hematuria.  Musculoskeletal:  Negative for arthralgias and back pain.  Skin:  Negative for color change and rash.  Neurological:  Positive for dizziness. Negative for seizures and syncope.  All  other systems reviewed and are negative.   Physical Exam Updated Vital Signs BP (!) 169/72   Pulse (!) 41   Temp (!) 97.5 F (36.4 C) (Oral)   Resp 19   SpO2 100%  Physical Exam Vitals and nursing note reviewed.  Constitutional:      General: He is not in acute distress.    Appearance: Normal appearance. He is well-developed.  HENT:     Head: Normocephalic and atraumatic.  Eyes:     Extraocular Movements: Extraocular movements intact.     Conjunctiva/sclera: Conjunctivae normal.     Pupils: Pupils are equal, round, and reactive to light.  Cardiovascular:     Rate and Rhythm: Normal rate and regular rhythm.     Heart sounds: No murmur heard. Pulmonary:     Effort: Pulmonary effort is normal. No respiratory distress.     Breath sounds: Normal breath sounds.  Abdominal:     Palpations: Abdomen is soft.     Tenderness: There is no abdominal tenderness.  Musculoskeletal:        General: No swelling.     Cervical back: Neck supple. No rigidity.     Right lower leg: Edema present.     Left lower leg: Edema present.     Comments: Minimal bilateral lower extremity swelling.  No chronic skin changes.  Skin:    General: Skin is warm and dry.     Capillary Refill: Capillary refill takes less than 2 seconds.  Neurological:     General: No focal deficit present.     Mental Status: He is alert and oriented to person, place, and time.     Cranial Nerves: No cranial nerve deficit.     Sensory: No sensory deficit.     Motor: No weakness.  Psychiatric:        Mood and Affect: Mood normal.     ED Results / Procedures / Treatments   Labs (all labs ordered are listed, but only abnormal results are displayed) Labs Reviewed  CBC WITH DIFFERENTIAL/PLATELET - Abnormal; Notable for the following components:      Result Value   Platelets 128 (*)    All other components within normal limits  BRAIN NATRIURETIC PEPTIDE - Abnormal; Notable for the following components:   B Natriuretic  Peptide 100.7 (*)    All other components within normal limits  COMPREHENSIVE METABOLIC PANEL    EKG EKG Interpretation Date/Time:  Wednesday May 28 2023 10:49:00 EDT Ventricular Rate:  61 PR Interval:  237 QRS Duration:  109 QT Interval:  390 QTC Calculation: 393 R Axis:   61  Text Interpretation: Sinus rhythm Multiple ventricular premature complexes Prolonged PR interval Nonspecific T abnrm, anterolateral leads Confirmed by Vanetta Mulders [id number]) on 05/28/2023 11:13:38 AM  Radiology CT Head Wo Contrast  Result Date: 05/28/2023 CLINICAL DATA:  Neuro deficit. Dizziness for  3 days. Bilateral leg swelling and SOB. EXAM: CT HEAD WITHOUT CONTRAST TECHNIQUE: Contiguous axial images were obtained from the base of the skull through the vertex without intravenous contrast. RADIATION DOSE REDUCTION: This exam was performed according to the departmental dose-optimization program which includes automated exposure control, adjustment of the mA and/or kV according to patient size and/or use of iterative reconstruction technique. COMPARISON:  06/20/2021 FINDINGS: Brain: No evidence of acute infarction, hemorrhage, hydrocephalus, extra-axial collection or mass lesion/mass effect. There is mild diffuse low-attenuation within the subcortical and periventricular white matter compatible with chronic microvascular disease. Vascular: No hyperdense vessel or unexpected calcification. Skull: Normal. Negative for fracture or focal lesion. Sinuses/Orbits: No acute finding. Postoperative changes in the right globe with scleral buckle. Other: None IMPRESSION: No acute intracranial abnormality. Electronically Signed   By: Signa Kell M.D.   On: 05/28/2023 12:48   DG Chest Port 1 View  Result Date: 05/28/2023 CLINICAL DATA:  Dizziness Leg Swelling EXAM: PORTABLE CHEST 1 VIEW COMPARISON:  CXR 01/01/22 FINDINGS: No pleural effusion. No pneumothorax. Normal cardiac and mediastinal contours. No focal airspace opacity.  Postsurgical changes in the right upper lobe. No radiographically apparent displaced rib fractures. Visualized upper abdomen is unremarkable. IMPRESSION: No focal airspace opacity Electronically Signed   By: Lorenza Cambridge M.D.   On: 05/28/2023 12:47    Procedures Procedures    Medications Ordered in ED Medications - No data to display  ED Course/ Medical Decision Making/ A&P                             Medical Decision Making Amount and/or Complexity of Data Reviewed Labs: ordered. Radiology: ordered.   Patient able to move his head back-and-forth very rapidly this does not elicit any of the dizziness does have a past history of vertigo but he feels like this is different.  Also has been persistent for the past 3 days.  No neurodeficit on exam.  Head CT negative.  So we will proceed with MRI just to rule out stroke for his difficulty with walking where he feels he is offkilter.  Patient was mostly worried about the lower extremity swelling.  Patient's renal function normal CBC normal.  Patient's BNP up a little bit at 100.7.  Chest x-ray without any focal airspace opacities no evidence of any pulmonary edema.  Patient can follow-up with his cardiologist for further workup for possibility of right heart failure.  Does not require admission for this.  If the MRI shows evidence of a stroke then admission would be warranted. Final Clinical Impression(s) / ED Diagnoses Final diagnoses:  Dizziness  Swelling of lower extremity    Rx / DC Orders ED Discharge Orders     None         Vanetta Mulders, MD 05/28/23 1534

## 2023-05-28 NOTE — ED Notes (Signed)
Pt discharged to home using teachback Method. Discharge instructions have been discussed with patient and/or family members. Pt verbally acknowledges understanding d/c instructions, has been given opportunity for questions to be answered, and endorses comprehension to checkout at registration before leaving.  

## 2023-05-28 NOTE — ED Notes (Signed)
IV removed LT AC

## 2023-05-28 NOTE — ED Provider Notes (Signed)
Care of patient received from prior provider at 3:36 PM, please see their note for complete H/P and care plan.  Received handoff per ED course.  Clinical Course as of 05/28/23 2245  Wed May 28, 2023  1535 Stable HO from [facility] 89 YOM with a chief complaint of dizziness. Longstanding hx of vertigo. Worsening gait today x3 days.  MRI in process.  Also has leg swelling, slight BNP elevation. Likely CHF. [CC]  1754 Reassessed at bedside, no further vertigo episodes while patient has been in the emergency department today.  Denies fevers chills nausea vomiting syncope shortness of breath.  BNP slightly elevated but exam is more consistent with very slight volume overload. He is already on spironolactone for treatment.  Recommended elevation of legs likely dependent edema.  Will start on meclizine as he has had benefit in the past from Dramamine. MRI without acute pathology, symptoms improving, patient would be outside of any stroke interventional evaluation and is already aggressive medical management multiple subspecialties. Patient stable for outpatient care management with strict return precautions.. [CC]    Clinical Course User Index [CC] Glyn Ade, MD       Glyn Ade, MD 05/28/23 [phone]

## 2023-05-28 NOTE — Discharge Instructions (Addendum)
As we discussed you could have a component of some right heart failure.  Recommend follow back up with cardiology.

## 2023-05-28 NOTE — ED Triage Notes (Signed)
Pt states he has been dizzy the last 3 days and last night he has noticed bilateral leg swelling. Pt also states today he is having some sob.

## 2023-06-02 DIAGNOSIS — D696 Thrombocytopenia, unspecified: Secondary | ICD-10-CM | POA: Diagnosis not present

## 2023-06-02 DIAGNOSIS — I2510 Atherosclerotic heart disease of native coronary artery without angina pectoris: Secondary | ICD-10-CM | POA: Diagnosis not present

## 2023-06-02 DIAGNOSIS — Z0000 Encounter for general adult medical examination without abnormal findings: Secondary | ICD-10-CM | POA: Diagnosis not present

## 2023-06-02 DIAGNOSIS — J439 Emphysema, unspecified: Secondary | ICD-10-CM | POA: Diagnosis not present

## 2023-06-02 DIAGNOSIS — K219 Gastro-esophageal reflux disease without esophagitis: Secondary | ICD-10-CM | POA: Diagnosis not present

## 2023-06-02 DIAGNOSIS — I10 Essential (primary) hypertension: Secondary | ICD-10-CM | POA: Diagnosis not present

## 2023-06-02 DIAGNOSIS — I712 Thoracic aortic aneurysm, without rupture, unspecified: Secondary | ICD-10-CM | POA: Diagnosis not present

## 2023-06-02 DIAGNOSIS — I700 Atherosclerosis of aorta: Secondary | ICD-10-CM | POA: Diagnosis not present

## 2023-06-12 ENCOUNTER — Other Ambulatory Visit: Payer: Self-pay | Admitting: Cardiology

## 2023-07-22 NOTE — Progress Notes (Signed)
Cardiology Office Note:    Date:  07/28/2023   ID:  Wayne Rodriguez, DOB July 18, 1946, MRN [id number]  PCP:  Joycelyn Rua, MD  Cardiologist:  Aidynn Krenn [place], MD  Electrophysiologist:  None   Referring MD: Joycelyn Rua, MD   No chief complaint on file.   History of Present Illness:    Wayne Rodriguez is a 77 y.o. male seen for evaluation of atypical chest pain and palpitations. He has a hx of PVCs, aortic root dilatation, hypertension, coronary artery calcification, mitral valve prolapse and history of lung nodule.  Previous cardiac catheterization on 02/26/2010 demonstrated right dominant system with mild irregularities however no significant stenosis.  Last echocardiogram obtained on 04/12/2019 showed EF 60 to 65%, mild basal septal hypertrophy, mild posterior leaflet mitral valve prolapse, myxomatous mitral valve with mild thickening of leaflet, mild to moderate MR, mild dilatation of the ascending aorta measuring at 41 mm.  CT of the chest obtained on 06/01/2019 showed minimal increase in the ascending thoracic aortic aneurysm measuring at 4.5 cm, new 4 mm right upper lobe pulmonary nodule, aortic atherosclerosis and two-vessel coronary artery calcification.  He was seen by Dr. Dorris Fetch in July 2020, Follow up CT in January 2024 was unchanged.   He was seen in the ED on 07/26/20 with atypical chest pain. Seen by cardiology coverage. Ecg was normal. Troponin was normal x 2. D dimer normal.  He was admitted in February 2023 with dyspnea. States this occurred the day after a flu shot and after taking a couple of doses of statin. CT negative for PE but showed some new nodules ? Inflammatory. Treated for PNA with amoxicillin. Was intermittently bradycardic and had PVCs. Seen by our service and event monitor recommended. Echo was normal. BNP was normal. Statin also held since symptoms seemed to occur after starting this.   Subsequent event monitor showed no serious arrhythmia and no significant  bradycardia or pauses. Some PVCs.   He was seen in the ED in July with dizziness and some leg swelling. Had cranial CT and MRI done. Small vessel ischemic changes. CXR clear. BNP 100. States swelling resolved. Reduced salt intake. Still has some dizziness at times. No chest pain. Had CT chest 3 days ago. Report pending.     Past Medical History:  Diagnosis Date   Aortic root dilatation (HCC)    a. 09/2010 CT: 4.1cm Max Asc Ao diam, 3.7cm Max Desc Ao diam;; s/p MRI/MRA in April 2013   Back pain    Cancer [facility])    skin cancer removed from face   Chest pain    a. 02/2010 Cath: normal   Decreased exercise tolerance    a. 02/2012   GERD (gastroesophageal reflux disease)    HTN (hypertension)    Mitral valve prolapse    Palpitations    a. h/o palps r/t caffeine - has occurred again 02/2012   Pulmonary nodule    3 nodules removed over 3 years by Dr Dorris Fetch   PVC's (premature ventricular contractions) 07/17/2015   Vertigo    hx of    Past Surgical History:  Procedure Laterality Date   BACK SURGERY  1983   CARDIAC CATHETERIZATION     no PCI   COLONOSCOPY     COLONOSCOPY W/ POLYPECTOMY     EYE SURGERY  1997   remove foreign body; right/right eye is blurry   VIDEO ASSISTED THORACOSCOPY (VATS)/WEDGE RESECTION Right 03/17/2014   Procedure: VIDEO ASSISTED THORACOSCOPY (VATS)/WEDGE RESECTION;  Surgeon: Loreli Slot,  MD;  Location: [facility] OR;  Service: Thoracic;  Laterality: Right;    Current Medications: Current Meds  Medication Sig   acetaminophen (TYLENOL) 500 MG tablet Take 1,000 mg by mouth every 6 (six) hours as needed for mild pain.   amLODipine (NORVASC) 10 MG tablet Take 1 tablet (10 mg total) by mouth daily.   aspirin 81 MG tablet Take 81 mg by mouth daily.   atorvastatin (LIPITOR) 40 MG tablet Take 40 mg by mouth once a week.   Boswellia Serrata (BOSWELLIA PO) Take by mouth daily.   carboxymethylcellulose (REFRESH PLUS) 0.5 % SOLN Place 1 drop into both eyes daily as  needed (dry eyes).   Coenzyme Q10 200 MG capsule Take 200 mg by mouth daily.   ibuprofen (ADVIL) 200 MG tablet Take 400 mg by mouth every 8 (eight) hours as needed for mild pain.   lisinopril-hydrochlorothiazide (ZESTORETIC) 20-12.5 MG tablet Take 2 tablets by mouth daily.   spironolactone (ALDACTONE) 25 MG tablet Take 0.5 tablets (12.5 mg total) by mouth daily.   TART CHERRY PO Take by mouth daily.   TURMERIC PO Take by mouth daily.     Allergies:   Beta adrenergic blockers, Codeine, and Gabapentin   Social History   Socioeconomic History   Marital status: Married    Spouse name: Not on file   Number of children: Not on file   Years of education: Not on file   Highest education level: Not on file  Occupational History   Not on file  Tobacco Use   Smoking status: Former    Current packs/day: 0.00    Average packs/day: 1 pack/day for 30.0 years (30.0 ttl pk-yrs)    Types: Cigarettes    Start date: 03/08/1962    Quit date: 03/08/1992    Years since quitting: 31.4    Passive exposure: Past   Smokeless tobacco: Former    Types: Snuff, Chew    Quit date: 01/06/2013  Vaping Use   Vaping status: Never Used  Substance and Sexual Activity   Alcohol use: No   Drug use: No   Sexual activity: Not Currently  Other Topics Concern   Not on file  Social History Narrative   Not on file   Social Determinants of Health   Financial Resource Strain: Not on file  Food Insecurity: Not on file  Transportation Needs: Not on file  Physical Activity: Not on file  Stress: Not on file  Social Connections: Not on file     Family History: The patient's family history includes Colon cancer (age of onset: 50) in his sister; Lung cancer in his mother. There is no history of Colon polyps, Esophageal cancer, Rectal cancer, or Stomach cancer.  ROS:   Please see the history of present illness.     All other systems reviewed and are negative.  EKGs/Labs/Other Studies Reviewed:    The following  studies were reviewed today:  Echo 04/12/2019 IMPRESSIONS      1. The left ventricle has normal systolic function with an ejection fraction of 60-65%. The cavity size was normal. Mild basal septal hypertrophy. Left ventricular diastolic parameters were normal. No evidence of left ventricular regional wall motion  abnormalities.  2. The right ventricle has normal systolic function. The cavity was normal. There is no increase in right ventricular wall thickness.  3. Mild posterior leaflet mitral valve prolapse.  4. The mitral valve is myxomatous. Mild thickening of the mitral valve leaflet. Mitral valve regurgitation is mild to moderate  by color flow Doppler. The MR jet is eccentric anteriorly directed.  5. The aortic valve is tricuspid. Mild sclerosis of the aortic valve. Aortic valve regurgitation is mild to moderate by color flow Doppler.  6. There is mild dilatation of the ascending aorta measuring 41 mm.   CT of chest 06/01/2019 IMPRESSION: 1. Minimal increase in size of ascending thoracic aortic aneurysm which currently measures 4.5 cm in diameter (previously 4.4 cm). Ascending thoracic aortic aneurysm. Recommend semi-annual imaging followup by CTA or MRA and referral to cardiothoracic surgery if not already obtained. This recommendation follows 2010 ACCF/AHA/AATS/ACR/ASA/SCA/SCAI/SIR/STS/SVM Guidelines for the Diagnosis and Management of Patients With Thoracic Aortic Disease. Circulation. 2010; 121: W098-J191. Aortic aneurysm NOS (ICD10-I71.9). 2. Previously noted left upper lobe pulmonary nodule is stable dating back to 2017, considered benign. However, there is a new 4 mm right upper lobe pulmonary nodule (axial image 77 of series 8). This is nonspecific, but attention at time of follow-up examination is recommended to ensure stability of this finding. 3. Mild diffuse bronchial wall thickening with mild centrilobular and paraseptal emphysema; imaging findings suggestive of  underlying COPD. 4. Aortic atherosclerosis, in addition to left main and 2 vessel coronary artery disease. Please note that although the presence of coronary artery calcium documents the presence of coronary artery disease, the severity of this disease and any potential stenosis cannot be assessed on this non-gated CT examination. Assessment for potential risk factor modification, dietary therapy or pharmacologic therapy may be warranted, if clinically indicated.  Echo 01/02/22: IMPRESSIONS     1. Left ventricular ejection fraction, by estimation, is 65 to 70%. The  left ventricle has normal function. The left ventricle has no regional  wall motion abnormalities. Left ventricular diastolic parameters were  normal.   2. Right ventricular systolic function is normal. The right ventricular  size is normal. There is normal pulmonary artery systolic pressure.   3. Right atrial size was mildly dilated.   4. The mitral valve is normal in structure. Trivial mitral valve  regurgitation. No evidence of mitral stenosis.   5. The aortic valve is normal in structure. Aortic valve regurgitation is  mild. No aortic stenosis is present.   Event monitor 01/29/22: Study Highlights    Normal sinus rhythm Single 4 beat run of NSVT Few short runs of SVT - longest 11 beats. Occastional PVCs     Patch Wear Time:  13 days and 11 hours (2023-03-06T19:53:01-499 to 2023-03-20T08:51:23-0400)   Patient had a min HR of 39 bpm, max HR of 174 bpm, and avg HR of 59 bpm. Predominant underlying rhythm was Sinus Rhythm. First Degree AV Block was present. 1 run of Ventricular Tachycardia occurred lasting 4 beats with a max rate of 174 bpm (avg 167  bpm). 11 Supraventricular Tachycardia runs occurred, the run with the fastest interval lasting 11 beats with a max rate of 132 bpm, the longest lasting 10 beats with an avg rate of 95 bpm. Some episodes of Supraventricular Tachycardia may be possible  Atrial Tachycardia  with variable block. Isolated SVEs were rare (<1.0%), SVE Couplets were rare (<1.0%), and SVE Triplets were rare (<1.0%). Isolated VEs were occasional (3.0%, 33984), VE Couplets were rare (<1.0%, 593), and VE Triplets were rare (<1.0%,  24). Ventricular Bigeminy and Trigeminy were present.      Recent Labs: 05/28/2023: ALT 16; B Natriuretic Peptide 100.7; BUN 15; Creatinine, Ser 0.95; Hemoglobin 14.7; Platelets 128; Potassium 3.5; Sodium 136  Recent Lipid Panel    Component Value Date/Time  CHOL 127 09/29/2017 1009   TRIG 78 09/29/2017 1009   HDL 59 09/29/2017 1009   CHOLHDL 3 10/19/2012 0844   VLDL 22.8 10/19/2012 0844   LDLCALC 52 09/29/2017 1009   Dated 12/15/19: cholesterol 135, triglycerides 77, HDL 53, LDL 67. ALT normal. Dated 12/14/20: cholesterol 120, triglycerides 74, HDL 57, LDL 48. CMET normal.  Dated 12/19/21: cholesterol 155, triglycerides 132, HDL 62, LDL 70.CBC< CMET, TSH normal Dated 12/19/22: cholesterol 124, triglycerides 98, HDL 65, LDL 41.   Ecg today shows NSR rate 54. First degree AV block. Otherwise normal.   CT ANGIOGRAPHY CHEST WITH CONTRAST   TECHNIQUE: Multidetector CT imaging of the chest was performed using the standard protocol during bolus administration of intravenous contrast. Multiplanar CT image reconstructions and MIPs were obtained to evaluate the vascular anatomy.   CONTRAST:  75mL ISOVUE-370 IOPAMIDOL (ISOVUE-370) INJECTION 76%   COMPARISON:  11/18/2019, 06/01/2019, 01/27/2018, 01/28/2017, most remote 09/24/2010   FINDINGS: Cardiovascular:   Heart:   No cardiomegaly. No pericardial fluid/thickening. No significant coronary calcifications.   Aorta:   No significant aortic valve calcifications.   Maximum diameter of the ascending aorta estimated 41 mm. Greatest diameter estimated on the most remote CT 09/24/2010 was 41 mm.   Mild atherosclerotic changes of the aortic arch. Branch vessels are patent. Three vessel arch. Mild  atherosclerosis of the left subclavian artery without high-grade stenosis. The cervical cerebral vessels are patent. Dominant right vertebral artery.   No dissection or ulcerated plaque. Diameter of the thoracic aorta at the hiatus measures 28 mm.   Pulmonary arteries:   Timing of the contrast bolus is not optimized for evaluation of pulmonary artery filling defects.   Mediastinum/Nodes: No mediastinal adenopathy. Unremarkable appearance of the thoracic esophagus.   Unremarkable thoracic inlet.   Lungs/Pleura: Scarring/surgical changes at the apex of the right upper lobe, similar to the comparison. No new or enlarging nodularity or soft tissue. No pneumothorax or pleural effusion.   No confluent airspace disease.   Left upper lobe nodule with linear configuration, measuring longest diameter 8.8 mm, short axis 5.4 mm. Essentially this is unchanged dating to the CT of 04/30/2016 when it was identified. At that time the diameters estimated 6 mm x 10 mm.   No additional nodules.   Upper Abdomen: Calcifications within spleen parenchyma. No acute finding of the upper abdomen.   Musculoskeletal: Degenerative changes of the spine with disc space narrowing. Syndesmophyte formation along the ventral aspect of the thoracic spine. No bony canal narrowing. No displaced fracture.   Review of the MIP images confirms the above findings.   IMPRESSION: Diameter of the ascending aorta is essentially unchanged when compared to the most remote CT of 09/24/2010, with the greatest estimated diameter on the current CT approximately 41 mm.   The left upper lobe nodule is favored to be benign, given the relatively unchanged appearance/size over nearly 5 years dating to the most remote comparison CT of 04/25/2015.   Redemonstration of postsurgical changes of right upper lobe with associated scarring.   Aortic Atherosclerosis (ICD10-I70.0).   Signed,   Yvone Neu. Loreta Ave, DO, RPVI  Physical  Exam:    VS:  BP 110/60 (BP Location: Right Arm, Patient Position: Sitting, Cuff Size: Normal)   Pulse (!) 53   Ht 5\' 9"  (1.753 m)   Wt 139 lb 9.6 oz (63.3 kg)   SpO2 97%   BMI 20.62 kg/m     Wt Readings from Last 3 Encounters:  07/28/23 139 lb 9.6 oz (  63.3 kg)  01/20/23 144 lb (65.3 kg)  12/03/22 146 lb (66.2 kg)     GEN:  Well nourished, well developed in no acute distress HEENT: Normal NECK: No JVD; No carotid bruits LYMPHATICS: No lymphadenopathy CARDIAC: RRR, no murmurs, rubs, gallops RESPIRATORY:  Clear to auscultation without rales, wheezing or rhonchi  ABDOMEN: Soft, non-tender, non-distended MUSCULOSKELETAL:  No edema; No deformity  SKIN: Warm and dry NEUROLOGIC:  Alert and oriented x 3 PSYCHIATRIC:  Normal affect   ASSESSMENT:    1. Coronary artery disease of native artery of native heart with stable angina pectoris (HCC)   2. Essential hypertension   3. Hyperlipidemia LDL goal <70   4. Aneurysm of ascending aorta without rupture (HCC)     PLAN:    In order of problems listed above:  1.   Coronary artery calcification: Seen on last CT of chest in July 2020.  Denies any  chest discomfort.  Lipids levels were excellent even before placed on statin. I would just hold off on this now  3.  Thoracic aortic aneurysm: has been stable at 4.1-4.2 cm dating back to 2011. If recent CT stable we can forego further evaluation. Follow up in one year  4.  History of PVCs: low burden on event monitor. No additional therapy needed.  5. Mitral valve prolapse: mild   6. Lung nodule:  follow up on CT  7. HTN controlled.    Medication Adjustments/Labs and Tests Ordered: Current medicines are reviewed at length with the patient today.  Concerns regarding medicines are outlined above.  No orders of the defined types were placed in this encounter.  No orders of the defined types were placed in this encounter.   There are no Patient Instructions on file for this visit.   Follow up in one year  Signed, Honore Wipperfurth [place], MD  07/28/2023 12:15 PM    [facility]

## 2023-07-25 ENCOUNTER — Ambulatory Visit (HOSPITAL_COMMUNITY)
Admission: RE | Admit: 2023-07-25 | Discharge: 2023-07-25 | Disposition: A | Discharge status: Home / Self Care | Payer: Medicare HMO | Source: Ambulatory Visit | Attending: Pulmonary Disease | Admitting: Pulmonary Disease

## 2023-07-25 DIAGNOSIS — I7121 Aneurysm of the ascending aorta, without rupture: Secondary | ICD-10-CM | POA: Insufficient documentation

## 2023-07-25 DIAGNOSIS — I2510 Atherosclerotic heart disease of native coronary artery without angina pectoris: Secondary | ICD-10-CM | POA: Diagnosis not present

## 2023-07-25 DIAGNOSIS — I700 Atherosclerosis of aorta: Secondary | ICD-10-CM | POA: Diagnosis not present

## 2023-07-25 DIAGNOSIS — I719 Aortic aneurysm of unspecified site, without rupture: Secondary | ICD-10-CM | POA: Diagnosis not present

## 2023-07-25 MED ORDER — IOHEXOL 350 MG/ML SOLN
75.0000 mL | Freq: Once | INTRAVENOUS | Status: AC | PRN
  Administered 2023-07-25: 75 mL via INTRAVENOUS

## 2023-07-28 ENCOUNTER — Encounter: Payer: Self-pay | Admitting: Cardiology

## 2023-07-28 ENCOUNTER — Ambulatory Visit: Payer: Medicare HMO | Attending: Cardiology | Admitting: Cardiology

## 2023-07-28 VITALS — BP 110/60 | HR 53 | Ht 69.0 in | Wt 139.6 lb

## 2023-07-28 DIAGNOSIS — I25118 Atherosclerotic heart disease of native coronary artery with other forms of angina pectoris: Secondary | ICD-10-CM | POA: Diagnosis not present

## 2023-07-28 DIAGNOSIS — I10 Essential (primary) hypertension: Secondary | ICD-10-CM | POA: Diagnosis not present

## 2023-07-28 DIAGNOSIS — E785 Hyperlipidemia, unspecified: Secondary | ICD-10-CM

## 2023-07-28 DIAGNOSIS — I7121 Aneurysm of the ascending aorta, without rupture: Secondary | ICD-10-CM

## 2023-07-28 NOTE — Patient Instructions (Signed)
Medication Instructions:  Continue same medications *If you need a refill on your cardiac medications before your next appointment, please call your pharmacy*   Lab Work: None ordered   Testing/Procedures: None ordered   Follow-Up: At [facility], you and your health needs are our priority.  As part of our continuing mission to provide you with exceptional heart care, we have created designated Provider Care Teams.  These Care Teams include your primary Cardiologist (physician) and Advanced Practice Providers (APPs -  Physician Assistants and Nurse Practitioners) who all work together to provide you with the care you need, when you need it.  We recommend signing up for the patient portal called "MyChart".  Sign up information is provided on this After Visit Summary.  MyChart is used to connect with patients for Virtual Visits (Telemedicine).  Patients are able to view lab/test results, encounter notes, upcoming appointments, etc.  Non-urgent messages can be sent to your provider as well.   To learn more about what you can do with MyChart, go to [email or web address].    Your next appointment:  1 year    Call in June to schedule Sept appointment     Provider:  Dr.Jordan

## 2023-10-13 ENCOUNTER — Other Ambulatory Visit: Payer: Self-pay | Admitting: Thoracic Surgery (Cardiothoracic Vascular Surgery)

## 2023-10-13 DIAGNOSIS — I7121 Aneurysm of the ascending aorta, without rupture: Secondary | ICD-10-CM

## 2023-11-08 DIAGNOSIS — Z20822 Contact with and (suspected) exposure to covid-19: Secondary | ICD-10-CM | POA: Diagnosis not present

## 2023-11-08 DIAGNOSIS — R058 Other specified cough: Secondary | ICD-10-CM | POA: Diagnosis not present

## 2023-11-08 DIAGNOSIS — R0989 Other specified symptoms and signs involving the circulatory and respiratory systems: Secondary | ICD-10-CM | POA: Diagnosis not present

## 2023-11-17 DIAGNOSIS — I700 Atherosclerosis of aorta: Secondary | ICD-10-CM | POA: Diagnosis not present

## 2023-11-17 DIAGNOSIS — I712 Thoracic aortic aneurysm, without rupture, unspecified: Secondary | ICD-10-CM | POA: Diagnosis not present

## 2023-11-17 DIAGNOSIS — I2510 Atherosclerotic heart disease of native coronary artery without angina pectoris: Secondary | ICD-10-CM | POA: Diagnosis not present

## 2023-11-17 DIAGNOSIS — K219 Gastro-esophageal reflux disease without esophagitis: Secondary | ICD-10-CM | POA: Diagnosis not present

## 2023-11-17 DIAGNOSIS — I10 Essential (primary) hypertension: Secondary | ICD-10-CM | POA: Diagnosis not present

## 2023-11-17 DIAGNOSIS — J439 Emphysema, unspecified: Secondary | ICD-10-CM | POA: Diagnosis not present

## 2023-11-20 NOTE — Progress Notes (Deleted)
 [address]       [place] [address]             [phone]        Wayne Rodriguez [id number] June 20, 1946  History of Present Illness: [name] is a 78 year old male with a past medical history HTN, GERD, pulmonary nodules, mitral valve prolapse, remote history of tobacco abuse and PVCs. He was originally referred to Dr. Dorris Fetch for nodules in his right upper lobe that were hypermetabolic on PET scan and underwent thoracoscopic wedge resection of the right upper lobe nodules as well as an old calcified granuloma which were found to be inflammatory. On follow up in 2016 he was incidentally found to have 4.3cm ascending aortic aneurysm which has had minimal change over time.   He denies family history of ATAA and personal history of connective tissue disorder.   Today he reports ***   Current Outpatient Medications on File Prior to Visit  Medication Sig Dispense Refill   acetaminophen (TYLENOL) 500 MG tablet Take 1,000 mg by mouth every 6 (six) hours as needed for mild pain.     amLODipine (NORVASC) 10 MG tablet Take 1 tablet (10 mg total) by mouth daily. 90 tablet 3   aspirin 81 MG tablet Take 81 mg by mouth daily.     atorvastatin (LIPITOR) 40 MG tablet Take 40 mg by mouth once a week.     Boswellia Serrata (BOSWELLIA PO) Take by mouth daily.     carboxymethylcellulose (REFRESH PLUS) 0.5 % SOLN Place 1 drop into both eyes daily as needed (dry eyes).     Coenzyme Q10 200 MG capsule Take 200 mg by mouth daily.     ibuprofen (ADVIL) 200 MG tablet Take 400 mg by mouth every 8 (eight) hours as needed for mild pain.     lisinopril-hydrochlorothiazide (ZESTORETIC) 20-12.5 MG tablet Take 2 tablets by mouth daily. 180 tablet 3   nitroGLYCERIN (NITROSTAT) 0.4 MG SL tablet Place 1 tablet (0.4 mg total) under the tongue every 5 (five) minutes as needed. (Patient not taking: Reported on 07/28/2023) 25 tablet 11   pantoprazole (PROTONIX) 40 MG tablet Take 40 mg by  mouth daily. (Patient not taking: Reported on 07/28/2023)     Probiotic Product (PROBIOTIC DAILY PO) Take 1 capsule by mouth daily. Reported on 01/15/2016 (Patient not taking: Reported on 07/28/2023)     spironolactone (ALDACTONE) 25 MG tablet Take 0.5 tablets (12.5 mg total) by mouth daily. 45 tablet 3   TART CHERRY PO Take by mouth daily.     TURMERIC PO Take by mouth daily.     No current facility-administered medications on file prior to visit.   Vitals:  Physical Exam General: Neuro: CV: Pulm: GI: Extremities:  CTA Results:    Impression and Plan: *** with a *** cm ascending aortic aneurysm.  Echocardiogram shows a tricuspid aortic valve with evidence of mild regurgitation.  We discussed the natural history and and risk factors for growth of ascending aortic aneurysms.  We covered the importance of smoking cessation, tight blood pressure control, refraining from lifting heavy objects, and avoiding fluoroquinolones.  The patient is aware of signs and symptoms of aortic dissection and when to present to the emergency department.  We will continue surveillance and a repeat ** was ordered for ***.    Risk Modification:  Statin: Atorvastatin  Smoking cessation instruction/counseling given:  {CHL AMB [facility] SMOKING CESSATION COUNSELING:20758}  Patient was counseled on  importance of Blood Pressure Control.  Despite Medical intervention if the patient notices persistently elevated blood pressure readings.  They are instructed to contact their Primary Care Physician  Please avoid use of Fluoroquinolones as this can potentially increase your risk of Aortic Rupture and/or Dissection  Patient educated on signs and symptoms of Aortic Dissection, handout also provided in AVS  Jenny Reichmann, PA-C 11/20/23

## 2023-11-27 ENCOUNTER — Other Ambulatory Visit: Payer: Medicare HMO

## 2023-12-01 DIAGNOSIS — H5203 Hypermetropia, bilateral: Secondary | ICD-10-CM | POA: Diagnosis not present

## 2023-12-04 ENCOUNTER — Ambulatory Visit: Payer: Medicare HMO

## 2024-02-01 ENCOUNTER — Other Ambulatory Visit (HOSPITAL_BASED_OUTPATIENT_CLINIC_OR_DEPARTMENT_OTHER): Payer: Self-pay | Admitting: Family

## 2024-03-30 DIAGNOSIS — H25042 Posterior subcapsular polar age-related cataract, left eye: Secondary | ICD-10-CM | POA: Diagnosis not present

## 2024-03-30 DIAGNOSIS — H35373 Puckering of macula, bilateral: Secondary | ICD-10-CM | POA: Diagnosis not present

## 2024-03-30 DIAGNOSIS — H2512 Age-related nuclear cataract, left eye: Secondary | ICD-10-CM | POA: Diagnosis not present

## 2024-03-30 DIAGNOSIS — H18413 Arcus senilis, bilateral: Secondary | ICD-10-CM | POA: Diagnosis not present

## 2024-04-03 DIAGNOSIS — I2510 Atherosclerotic heart disease of native coronary artery without angina pectoris: Secondary | ICD-10-CM | POA: Diagnosis not present

## 2024-04-03 DIAGNOSIS — J439 Emphysema, unspecified: Secondary | ICD-10-CM | POA: Diagnosis not present

## 2024-04-03 DIAGNOSIS — I10 Essential (primary) hypertension: Secondary | ICD-10-CM | POA: Diagnosis not present

## 2024-04-12 DIAGNOSIS — H2512 Age-related nuclear cataract, left eye: Secondary | ICD-10-CM | POA: Diagnosis not present

## 2024-05-14 DIAGNOSIS — Z0100 Encounter for examination of eyes and vision without abnormal findings: Secondary | ICD-10-CM | POA: Diagnosis not present

## 2024-06-03 DIAGNOSIS — I10 Essential (primary) hypertension: Secondary | ICD-10-CM | POA: Diagnosis not present

## 2024-06-03 DIAGNOSIS — J439 Emphysema, unspecified: Secondary | ICD-10-CM | POA: Diagnosis not present

## 2024-06-03 DIAGNOSIS — I2510 Atherosclerotic heart disease of native coronary artery without angina pectoris: Secondary | ICD-10-CM | POA: Diagnosis not present

## 2024-06-09 DIAGNOSIS — I712 Thoracic aortic aneurysm, without rupture, unspecified: Secondary | ICD-10-CM | POA: Diagnosis not present

## 2024-06-09 DIAGNOSIS — Z0000 Encounter for general adult medical examination without abnormal findings: Secondary | ICD-10-CM | POA: Diagnosis not present

## 2024-06-09 DIAGNOSIS — I2510 Atherosclerotic heart disease of native coronary artery without angina pectoris: Secondary | ICD-10-CM | POA: Diagnosis not present

## 2024-06-09 DIAGNOSIS — I10 Essential (primary) hypertension: Secondary | ICD-10-CM | POA: Diagnosis not present

## 2024-06-09 DIAGNOSIS — J439 Emphysema, unspecified: Secondary | ICD-10-CM | POA: Diagnosis not present

## 2024-06-09 DIAGNOSIS — Z1331 Encounter for screening for depression: Secondary | ICD-10-CM | POA: Diagnosis not present

## 2024-06-09 DIAGNOSIS — Z6821 Body mass index (BMI) 21.0-21.9, adult: Secondary | ICD-10-CM | POA: Diagnosis not present

## 2024-06-09 DIAGNOSIS — R233 Spontaneous ecchymoses: Secondary | ICD-10-CM | POA: Diagnosis not present

## 2024-07-04 DIAGNOSIS — J439 Emphysema, unspecified: Secondary | ICD-10-CM | POA: Diagnosis not present

## 2024-07-04 DIAGNOSIS — I2510 Atherosclerotic heart disease of native coronary artery without angina pectoris: Secondary | ICD-10-CM | POA: Diagnosis not present

## 2024-07-04 DIAGNOSIS — I10 Essential (primary) hypertension: Secondary | ICD-10-CM | POA: Diagnosis not present

## 2024-09-03 DIAGNOSIS — I10 Essential (primary) hypertension: Secondary | ICD-10-CM | POA: Diagnosis not present

## 2024-09-03 DIAGNOSIS — I2510 Atherosclerotic heart disease of native coronary artery without angina pectoris: Secondary | ICD-10-CM | POA: Diagnosis not present

## 2024-09-03 DIAGNOSIS — J439 Emphysema, unspecified: Secondary | ICD-10-CM | POA: Diagnosis not present
